# Patient Record
Sex: Female | Born: 1940 | Hispanic: Yes | Marital: Married | Smoking: Never smoker
Health system: Southern US, Community
[De-identification: ages and names within clinical notes are randomized; demographics above are authoritative.]

## PROBLEM LIST (undated history)

## (undated) DIAGNOSIS — C3490 Malignant neoplasm of unspecified part of unspecified bronchus or lung: Secondary | ICD-10-CM

## (undated) DIAGNOSIS — R7981 Abnormal blood-gas level: Secondary | ICD-10-CM

## (undated) DIAGNOSIS — F0280 Dementia in other diseases classified elsewhere without behavioral disturbance: Secondary | ICD-10-CM

## (undated) DIAGNOSIS — J189 Pneumonia, unspecified organism: Secondary | ICD-10-CM

## (undated) DIAGNOSIS — F0390 Unspecified dementia without behavioral disturbance: Secondary | ICD-10-CM

## (undated) DIAGNOSIS — Z9981 Dependence on supplemental oxygen: Secondary | ICD-10-CM

## (undated) HISTORY — DX: Dementia in other diseases classified elsewhere, unspecified severity, without behavioral disturbance, psychotic disturbance, mood disturbance, and anxiety: F02.80

## (undated) HISTORY — PX: PARTIAL HYSTERECTOMY: SHX80

## (undated) HISTORY — DX: Malignant neoplasm of unspecified part of unspecified bronchus or lung: C34.90

## (undated) HISTORY — DX: Pneumonia, unspecified organism: J18.9

## (undated) HISTORY — DX: Abnormal blood-gas level: R79.81

## (undated) HISTORY — DX: Dependence on supplemental oxygen: Z99.81

## (undated) HISTORY — DX: Unspecified dementia, unspecified severity, without behavioral disturbance, psychotic disturbance, mood disturbance, and anxiety: F03.90

---

## 2004-10-24 DIAGNOSIS — I4891 Unspecified atrial fibrillation: Secondary | ICD-10-CM

## 2004-10-24 HISTORY — DX: Unspecified atrial fibrillation: I48.91

## 2019-03-28 ENCOUNTER — Encounter: Payer: Self-pay | Admitting: Nurse Practitioner

## 2019-03-28 NOTE — Telephone Encounter (Signed)
Opened in error

## 2019-04-01 ENCOUNTER — Encounter (INDEPENDENT_AMBULATORY_CARE_PROVIDER_SITE_OTHER): Payer: Self-pay

## 2019-04-01 ENCOUNTER — Encounter: Payer: Self-pay | Admitting: Nurse Practitioner

## 2019-04-01 ENCOUNTER — Other Ambulatory Visit: Payer: Self-pay

## 2019-04-01 ENCOUNTER — Ambulatory Visit (INDEPENDENT_AMBULATORY_CARE_PROVIDER_SITE_OTHER): Payer: Medicare PPO | Admitting: Nurse Practitioner

## 2019-04-01 VITALS — BP 124/70 | HR 46 | Temp 98.1°F | Ht 63.0 in | Wt 164.0 lb

## 2019-04-01 DIAGNOSIS — Z85118 Personal history of other malignant neoplasm of bronchus and lung: Secondary | ICD-10-CM

## 2019-04-01 DIAGNOSIS — R0902 Hypoxemia: Secondary | ICD-10-CM | POA: Diagnosis not present

## 2019-04-01 DIAGNOSIS — R001 Bradycardia, unspecified: Secondary | ICD-10-CM

## 2019-04-01 DIAGNOSIS — I4891 Unspecified atrial fibrillation: Secondary | ICD-10-CM | POA: Diagnosis not present

## 2019-04-01 DIAGNOSIS — H6121 Impacted cerumen, right ear: Secondary | ICD-10-CM

## 2019-04-01 DIAGNOSIS — G309 Alzheimer's disease, unspecified: Secondary | ICD-10-CM

## 2019-04-01 DIAGNOSIS — H9312 Tinnitus, left ear: Secondary | ICD-10-CM | POA: Diagnosis not present

## 2019-04-01 DIAGNOSIS — F0281 Dementia in other diseases classified elsewhere with behavioral disturbance: Secondary | ICD-10-CM

## 2019-04-01 DIAGNOSIS — I509 Heart failure, unspecified: Secondary | ICD-10-CM

## 2019-04-01 NOTE — Patient Instructions (Signed)
Follow up in 3 months with labs prior to visit

## 2019-04-01 NOTE — Progress Notes (Signed)
Careteam: Patient Care Team: System, Pcp Not In as PCP - General Papagikos, Venia Carbon, MD as Referring Physician (Radiation Oncology)  Advanced Directive information Does Patient Have a Medical Advance Directive?: Yes, Type of Advance Directive: Out of facility DNR (pink MOST or yellow form), Does patient want to make changes to medical advance directive?: No - Patient declined  No Known Allergies  Chief Complaint  Patient presents with  . Establish Care    New patient establish care. Patient would like referral to local doctors within [place] area. Here with daughter Annakate   . Lung Specialist    Dr.Brian Legere in [facility]  . Advanced Directive    Discuss HCPOA, Living Will and Most Form      HPI: Patient is a 78 y.o. female seen in the office today to establish care.  She is new to town. She lives with her daughter here in [place].   Hyperlipidemia- recently started on lipitor 20 mg by mouth daily after recent hospitalization. (hospitalized for pneumonia in Nov 2019)   A fib/bradycardia- on ASA 81 mg, reports she was previously on a blood thinner coumadin but did not like to go in and get INR, then was changed to xarelto but could not afford that. Declined coumadin.  Reports ongoing bradycardia since hospitalization in 2019.   Lower LE edema- taking spironolactone and lasix, daughter reports she also had fluid in her heart and lungs.   CHF- on spironolactone and lasix. No increase in edema or fluid. Continues on O2, 2-3L, feels like she does not need it but has recent test to see if she qualified and she did.   Hx of lung cancer- currently in remission per daughter.   Dementia- has agitation associated with memory loss.  Not been on medication for this. Daughter states that she would not wish to take medication. Having hard time taking what she is taking now.   For supplement she takes Vit B12 for energy, D3 because she is not outside and Vit C  for immunity.   Reports ringing in ear, was only happening at night and now all the time. No dizziness or lightheadness.   Review of Systems:  Review of Systems  Constitutional: Negative for chills, fever and weight loss.  HENT: Positive for tinnitus.   Respiratory: Negative for cough, sputum production and shortness of breath.   Cardiovascular: Negative for chest pain, palpitations and leg swelling.  Gastrointestinal: Negative for abdominal pain, constipation, diarrhea and heartburn.  Genitourinary: Negative for dysuria, frequency and urgency.  Musculoskeletal: Negative for back pain, falls, joint pain and myalgias.  Skin: Negative.   Neurological: Negative for dizziness and headaches.  Psychiatric/Behavioral: Positive for memory loss. Negative for depression. The patient does not have insomnia.     Past Medical History:  Diagnosis Date  . Alzheimer disease [facility])    Per new patient packet- [facility]   . Dementia [facility])    Per new patient packet- [facility]   . Low oxygen saturation    Per new patient packet- [facility]   . Lung cancer [facility])    Per new patient packet- [facility]   . Pneumonia    Per new patient packet- [facility]    Past Surgical History:  Procedure Laterality Date  . PARTIAL HYSTERECTOMY     Still with ovaries, Per new patient packet- [facility]    Social History:   reports that she has never smoked. She has never used smokeless tobacco. She reports that she does  not drink alcohol or use drugs.  Family History  Problem Relation Age of Onset  . Dementia Mother   . Alzheimer's disease Father     Medications: Patient's Medications  New Prescriptions   No medications on file  Previous Medications   ASPIRIN EC 81 MG TABLET    Take 81 mg by mouth daily.   ATORVASTATIN (LIPITOR) 20 MG TABLET    Take 20 mg by mouth daily.   CHOLECALCIFEROL (VITAMIN D3 SUPER STRENGTH) 50 MCG (2000 UT) TABS    Take by mouth daily at 8 pm.   FUROSEMIDE (LASIX) 20 MG TABLET    Take 20 mg by mouth daily.   OXYGEN     Inhale 3 L into the lungs continuous.   SPIRONOLACTONE (ALDACTONE) 25 MG TABLET    Take 25 mg by mouth daily.   VITAMIN B-12 (CYANOCOBALAMIN) 1000 MCG TABLET    Take 1,000 mcg by mouth daily.   VITAMIN C (ASCORBIC ACID) 500 MG TABLET    Take 500 mg by mouth daily.   VITAMINS-LIPOTROPICS (LIPO-FLAVONOID PLUS PO)    Take 1,000 mg by mouth daily.  Modified Medications   No medications on file  Discontinued Medications   No medications on file    Physical Exam:  Vitals:   04/01/19 1338  BP: 124/70  Pulse: (!) 46  Temp: 98.1 F (36.7 C)  TempSrc: Oral  SpO2: 99%  Weight: 164 lb (74.4 kg)  Height: 5\' 3"  (1.6 m)   Body mass index is 29.05 kg/m. Wt Readings from Last 3 Encounters:  04/01/19 164 lb (74.4 kg)    Physical Exam Constitutional:      General: She is not in acute distress.    Appearance: She is well-developed. She is not diaphoretic.  HENT:     Head: Normocephalic and atraumatic.     Right Ear: There is impacted cerumen.     Left Ear: Tympanic membrane normal.     Nose: Nose normal.     Mouth/Throat:     Pharynx: No oropharyngeal exudate.  Eyes:     Conjunctiva/sclera: Conjunctivae normal.     Pupils: Pupils are equal, round, and reactive to light.  Neck:     Musculoskeletal: Normal range of motion and neck supple.  Cardiovascular:     Rate and Rhythm: Regular rhythm. Bradycardia present.     Heart sounds: Normal heart sounds.  Pulmonary:     Effort: Pulmonary effort is normal.     Breath sounds: Normal breath sounds.  Abdominal:     General: Bowel sounds are normal.     Palpations: Abdomen is soft.  Musculoskeletal:        General: No tenderness.  Skin:    General: Skin is warm and dry.     Findings: No bruising.  Neurological:     Mental Status: She is alert and oriented to person, place, and time.     Labs reviewed: Basic Metabolic Panel: No results for input(s): NA, K, CL, CO2, GLUCOSE, BUN, CREATININE, CALCIUM, MG, PHOS, TSH in the last 8760  hours. Liver Function Tests: No results for input(s): AST, ALT, ALKPHOS, BILITOT, PROT, ALBUMIN in the last 8760 hours. No results for input(s): LIPASE, AMYLASE in the last 8760 hours. No results for input(s): AMMONIA in the last 8760 hours. CBC: No results for input(s): WBC, NEUTROABS, HGB, HCT, MCV, PLT in the last 8760 hours. Lipid Panel: No results for input(s): CHOL, HDL, LDLCALC, TRIG, CHOLHDL, LDLDIRECT in the last 8760 hours. TSH:  No results for input(s): TSH in the last 8760 hours. A1C: No results found for: HGBA1C   Assessment/Plan 1. Hx of cancer of lung S/p resection, we do not have records at this time.  - Ambulatory referral to Pulmonology - Ambulatory referral to Hematology / Oncology  2. Atrial fibrillation, unspecified type (HCC) Bradycardia noted today. She did not tolerate coumadin due to excessive bruising and did not wish to check INR levels, NOAC were too expensive so it was decided she would maintain on ASA 81 mg daily.  - Ambulatory referral to Cardiology - CBC with Differential/Platelet - COMPLETE METABOLIC PANEL WITH GFR - TSH  3. Hypoxia Using long term O2 at 2L O2 maintained while in office for trail off oxygen but daughter reports it generally will drop.  - Ambulatory referral to Pulmonology  4. Bradycardia -ongoing since hospitalization in December of 2019, not on any rate related medication. Appears to be asymptomatic  - Ambulatory referral to Cardiology - COMPLETE METABOLIC PANEL WITH GFR - TSH  5. Alzheimer's dementia with behavioral disturbance, unspecified timing of dementia onset ([place]) -living with daughter, does not wish to be on medication at this time as she does not like to take pills. Daughter states that she would have to explain what the pill was for daily causing more agitation.   6. Congestive heart failure, unspecified HF chronicity, unspecified heart failure type ([place]) -continues on lasix and aldactone, no signs of fluid  overload at this time. - Ambulatory referral to Cardiology for further evaluation.   7. Tinnitus of left ear Ongoing, pt reports it "drives her crazy" denies hearing loss, dizziness but would like evaluated futher - Ambulatory referral to ENT for further evaluation    8. Impacted cerumen of right ear - Ear Lavage- pt tolerated well, used grabbers to remove large amount of wax  Awaiting pt records from previous PCP and specialist  Next appt: 06/28/2019  Janett Billow K. Nectar, [facility] [phone]

## 2019-04-02 LAB — CBC WITH DIFFERENTIAL/PLATELET
Absolute Monocytes: 439 cells/uL (ref 200–950)
Basophils Absolute: 31 cells/uL (ref 0–200)
Basophils Relative: 0.6 %
Eosinophils Absolute: 92 cells/uL (ref 15–500)
Eosinophils Relative: 1.8 %
HCT: 33.3 % — ABNORMAL LOW (ref 35.0–45.0)
Hemoglobin: 10.6 g/dL — ABNORMAL LOW (ref 11.7–15.5)
Lymphs Abs: 1433 cells/uL (ref 850–3900)
MCH: 29.1 pg (ref 27.0–33.0)
MCHC: 31.8 g/dL — ABNORMAL LOW (ref 32.0–36.0)
MCV: 91.5 fL (ref 80.0–100.0)
MPV: 13.3 fL — ABNORMAL HIGH (ref 7.5–12.5)
Monocytes Relative: 8.6 %
Neutro Abs: 3106 cells/uL (ref 1500–7800)
Neutrophils Relative %: 60.9 %
Platelets: 138 10*3/uL — ABNORMAL LOW (ref 140–400)
RBC: 3.64 10*6/uL — ABNORMAL LOW (ref 3.80–5.10)
RDW: 11.7 % (ref 11.0–15.0)
Total Lymphocyte: 28.1 %
WBC: 5.1 10*3/uL (ref 3.8–10.8)

## 2019-04-02 LAB — COMPLETE METABOLIC PANEL WITH GFR
AG Ratio: 1.2 (calc) (ref 1.0–2.5)
ALT: 8 U/L (ref 6–29)
AST: 15 U/L (ref 10–35)
Albumin: 4 g/dL (ref 3.6–5.1)
Alkaline phosphatase (APISO): 50 U/L (ref 37–153)
BUN: 18 mg/dL (ref 7–25)
CO2: 36 mmol/L — ABNORMAL HIGH (ref 20–32)
Calcium: 9.5 mg/dL (ref 8.6–10.4)
Chloride: 101 mmol/L (ref 98–110)
Creat: 0.65 mg/dL (ref 0.60–0.93)
GFR, Est African American: 99 mL/min/{1.73_m2} (ref >=60)
GFR, Est Non African American: 86 mL/min/{1.73_m2} (ref >=60)
Globulin: 3.4 g/dL (calc) (ref 1.9–3.7)
Glucose, Bld: 87 mg/dL (ref 65–139)
Potassium: 4.8 mmol/L (ref 3.5–5.3)
Sodium: 140 mmol/L (ref 135–146)
Total Bilirubin: 0.6 mg/dL (ref 0.2–1.2)
Total Protein: 7.4 g/dL (ref 6.1–8.1)

## 2019-04-02 LAB — TSH: TSH: 2.38 mIU/L (ref 0.40–4.50)

## 2019-04-11 ENCOUNTER — Telehealth: Payer: Self-pay | Admitting: Nurse Practitioner

## 2019-04-11 NOTE — Telephone Encounter (Signed)
Do they need a referral.

## 2019-04-11 NOTE — Telephone Encounter (Signed)
Just an Juluis Rainier; patient's daughter Innocence stated that they have a pulmonologist for patient to see. Dr. Dola Factor (daughter was unsure of spelling of name) in [place], [place].  Patient has an appointment on 04/18/19.

## 2019-05-15 ENCOUNTER — Telehealth: Payer: Self-pay | Admitting: Internal Medicine

## 2019-05-15 NOTE — Telephone Encounter (Signed)
LVM for pt to call back and be pre-screened for her appt on 05-16-19 with Dr Margaretann Loveless.

## 2019-05-16 ENCOUNTER — Encounter: Payer: Self-pay | Admitting: Internal Medicine

## 2019-05-16 ENCOUNTER — Telehealth: Payer: Self-pay | Admitting: Internal Medicine

## 2019-05-16 ENCOUNTER — Ambulatory Visit: Payer: Medicare PPO | Admitting: Internal Medicine

## 2019-05-16 ENCOUNTER — Other Ambulatory Visit: Payer: Self-pay

## 2019-05-16 VITALS — BP 138/52 | HR 51 | Temp 97.7°F | Ht 65.0 in | Wt 166.0 lb

## 2019-05-16 DIAGNOSIS — I509 Heart failure, unspecified: Secondary | ICD-10-CM

## 2019-05-16 DIAGNOSIS — I4891 Unspecified atrial fibrillation: Secondary | ICD-10-CM

## 2019-05-16 DIAGNOSIS — R079 Chest pain, unspecified: Secondary | ICD-10-CM | POA: Diagnosis not present

## 2019-05-16 MED ORDER — RIVAROXABAN 20 MG PO TABS: 20.0000 mg | ORAL_TABLET | Freq: Every day | ORAL | 1 refills | Status: DC

## 2019-05-16 NOTE — Patient Instructions (Addendum)
Medication Instructions:  Start Xarelto 20 mg daily   If you need a refill on your cardiac medications before your next appointment, please call your pharmacy.   Testing/Procedures: Echocardiogram - Your physician has requested that you have an echocardiogram. Echocardiography is a painless test that uses sound waves to create images of your heart. It provides your doctor with information about the size and shape of your heart and how well your heart's chambers and valves are working. This procedure takes approximately one hour. There are no restrictions for this procedure. This will be performed at our [facility] location - [address], [address].  Your physician has requested that you have a lexiscan myoview. A cardiac stress test is a cardiological test that measures the heart's ability to respond to external stress in a controlled clinical environment. The stress response is induced by intravenous pharmacological stimulation. This will be performed at our [facility] location- [address], [place]: At [employer], you and your health needs are our priority.  As part of our continuing mission to provide you with exceptional heart care, we have created designated Provider Care Teams.  These Care Teams include your primary Cardiologist (physician) and Advanced Practice Providers (APPs -  Physician Assistants and Nurse Practitioners) who all work together to provide you with the care you need, when you need it. . You will need a follow up appointment in 1 months with Doreene Adas, PA.    How to Use Compression Stockings Compression stockings are elastic socks that squeeze the legs. They help increase blood flow (circulation) to the legs, decrease swelling in the legs, and reduce the chance of developing blood clots in the lower legs. Compression stockings are often used by people who:  Are recovering from surgery.  Have poor circulation in their legs.  Tend to get blood  clots in their legs.  Have bulging (varicose) veins.  Sit or stay in bed for long periods of time. Follow instructions from your health care provider about how and when to wear your compression stockings. How to wear compression stockings Before you put on your compression stockings:  Make sure that they are the correct size and degree of compression. If you do not know your size or required grade of compression, ask your health care provider and follow the manufacturer's instructions that come with the stockings.  Make sure that they are clean, dry, and in good condition.  Check them for rips and tears. Do not put them on if they are ripped or torn. Put your stockings on first thing in the morning, before you get out of bed. Keep them on for as long as your health care provider advises. When you are wearing your stockings:  Keep them as smooth as possible. Do not allow them to bunch up. It is especially important to prevent the stockings from bunching up around your toes or behind your knees.  Do not roll the stockings downward and leave them rolled down. This can decrease blood flow to your leg.  Change them right away if they become wet or dirty. When you take off your stockings, inspect your legs and feet. Check for:  Open sores.  Red spots.  Swelling. General tips  Do not stop wearing compression stockings without talking to your health care provider first.  Wash your stockings every day with mild detergent in cold or warm water. Do not use bleach. Air-dry your stockings or dry them in  a clothes dryer on low heat. It may be helpful to have two pairs so that you have a pair to wear while the other is being washed.  Replace your stockings every 3-6 months.  If skin moisturizing is part of your treatment plan, apply lotion or cream at night so that your skin will be dry when you put on the stockings in the morning. It is harder to put the stockings on when you have lotion on  your legs or feet.  Wear nonskid shoes or slip-resistant socks when walking while wearing compression stockings. Contact a health care provider and remove your stockings if you have:  A feeling of pins and needles in your feet or legs.  Open sores, red spots, or other skin changes on your feet or legs.  Swelling or pain that gets worse. Get help right away if you have:  Numbness or tingling in your lower legs that does not get better right after you take the stockings off.  Toes or feet that are unusually cold or turn a bluish color.  A warm or red area on your leg.  New swelling or soreness in your leg.  Shortness of breath.  Chest pain.  A fast or irregular heartbeat.  Light-headedness.  Dizziness. Summary  Compression stockings are elastic socks that squeeze the legs.  They help increase blood flow (circulation) to the legs, decrease swelling in the legs, and reduce the chance of developing blood clots in the lower legs.  Follow instructions from your health care provider about how and when to wear your compression stockings.  Do not stop wearing your compression stockings without talking to your health care provider first. This information is not intended to replace advice given to you by your health care provider. Make sure you discuss any questions you have with your health care provider. Document Released: 08/07/2009 Document Revised: 10/12/2017 Document Reviewed: 10/12/2017 Elsevier Patient Education  2020 [employer].

## 2019-05-16 NOTE — Telephone Encounter (Signed)
New Message:   Pt 's  daughter will need tocome in with the pt. She is her Interpreter and pt also have memory problems.he p

## 2019-05-16 NOTE — Telephone Encounter (Signed)

## 2019-05-16 NOTE — Telephone Encounter (Signed)
Pt daughter has been made aware that she can attend appointment.

## 2019-05-16 NOTE — Progress Notes (Signed)
Cardiology Office Note:    Date:  05/16/2019   ID:  Valerie [name], DOB 09/11/1942, MRN [id number]  PCP:  Lauree Chandler, NP  Cardiologist:  No primary care provider on file.  Electrophysiologist:  None   Referring MD: Lauree Chandler, NP   Chief Complaint: establish cardiovascular care  History of Present Illness:    Valerie [name] is a 78 y.o. female with a hx of hyperlipidemia, atrial fibrillation with slow ventricular response, not currently on anticoagulation, lower extremity edema and history of multiple heart failure exacerbations, history of lung cancer in remission, and dementia for which she is joined by her daughter today who is a CMA and serves as the patient's interpreter today.  She presents to establish cardiovascular care at the request of Sherrie Mustache nurse practitioner.  The patient has recently moved from [place], [place] where her cardiologist was Dr. Emelia Loron.  Her primary cardiologist has retired, we will attempt to get records from his office if able.  She had a hospitalization last in November 2019.  She was feeling poorly several days before Thanksgiving and the day after Thanksgiving she stated that she needed to go to the hospital.  She was found to have hypoxemia and was hospitalized for 10 days.  The patient feels she only remembers being there overnight, and her daughter tells me she was very gravely ill.  Of note she did not require intubation or ICU stay.  She was found to have pneumonia, pulmonary edema, and "enlarged heart".  She has been continued on Lasix and spironolactone and has done much better from a heart failure standpoint since that time.  She does continue to have what sounds like NYHA class II symptoms with some orthopnea and dyspnea on exertion.  She does not endorse significant leg swelling and overall is able to complete her activities of daily living.  She has been prescribed oxygen and initially was using this continuously after her  hospitalization.  Now it is deemed appropriate to be used PRN.  We do not know the etiology or classification of her diagnosis of heart failure.  Once a month she endorses an episode of chest discomfort and points to the left side of her chest.  It is not exacerbated by deep breathing.  It is a fleeting chest discomfort for approximately 5 seconds.  It can occur at rest.  She has had a stress test several years ago that was reportedly normal per her daughter.  She is noted to have atrial fibrillation with slow ventricular response, it is unclear at this time if this is paroxysmal or persistent, though likely the latter.  She was previously on warfarin but had difficulty with frequent INR checks so she was transition to Xarelto.  Unfortunately cost was a prohibitive factor in this medication and it was discontinued.  She was continued on a baby aspirin.  She has since been informed that this is unlikely to be adequate anticoagulation and her chadsvasc is 4.  We have discussed stroke risk and percentages today. This patients CHA2DS2-VASc Score and unadjusted Ischemic Stroke Rate (% per year) is equal to 4.8 % stroke rate/year from a score of 4 Above score calculated as 1 point each if present [CHF, HTN, DM, Vascular=MI/PAD/Aortic Plaque, Age if 65-74, or Female] Above score calculated as 2 points each if present [Age > 75, or Stroke/TIA/TE].  Past Medical History:  Diagnosis Date   Alzheimer disease [facility])    Per new patient packet- [place]  Dementia ([place])    Per new patient packet- [facility]    Low oxygen saturation    Per new patient packet- [place]    Lung cancer ([place])    Per new patient packet- [facility]    Pneumonia    Per new patient packet- [place]     Past Surgical History:  Procedure Laterality Date   PARTIAL HYSTERECTOMY     Still with ovaries, Per new patient packet- [facility]     Current Medications: Current Meds  Medication Sig   aspirin EC 81 MG tablet Take 81 mg by mouth daily.    atorvastatin (LIPITOR) 20 MG tablet Take 20 mg by mouth daily.   Cholecalciferol (VITAMIN D3 SUPER STRENGTH) 50 MCG (2000 UT) TABS Take by mouth daily at 8 pm.   furosemide (LASIX) 20 MG tablet Take 20 mg by mouth daily.   OXYGEN Inhale 3 L into the lungs continuous.   spironolactone (ALDACTONE) 25 MG tablet Take 25 mg by mouth daily.   vitamin B-12 (CYANOCOBALAMIN) 1000 MCG tablet Take 1,000 mcg by mouth daily.   vitamin C (ASCORBIC ACID) 500 MG tablet Take 500 mg by mouth daily.   Vitamins-Lipotropics (LIPO-FLAVONOID PLUS PO) Take 1,000 mg by mouth daily.     Allergies:   Patient has no known allergies.   Social History   Socioeconomic History   Marital status: Married    Spouse name: Not on file   Number of children: Not on file   Years of education: Not on file   Highest education level: Not on file  Occupational History   Not on file  Social Needs   Financial resource strain: Not on file   Food insecurity    Worry: Not on file    Inability: Not on file   Transportation needs    Medical: Not on file    Non-medical: Not on file  Tobacco Use   Smoking status: Never Smoker   Smokeless tobacco: Never Used  Substance and Sexual Activity   Alcohol use: Never    Frequency: Never   Drug use: Never   Sexual activity: Not on file  Lifestyle   Physical activity    Days per week: Not on file    Minutes per session: Not on file   Stress: Not on file  Relationships   Social connections    Talks on phone: Not on file    Gets together: Not on file    Attends religious service: Not on file    Active member of club or organization: Not on file    Attends meetings of clubs or organizations: Not on file    Relationship status: Not on file  Other Topics Concern   Not on file  Social History Narrative   Per [facility] new patient packet abstracted 03/28/2019      Diet: Vegetarian       Caffeine: None      Married, if yes what year: Yes, 1959      Do you live  in a house, apartment, assisted living, condo, trailer, ect: House, 2 stories, 5 persons      Pets: Dog      Highest level of education: 6th grade       Current/Past profession: N/A      Exercise:No         Living Will: No   DNR: No   POA/HPOA: No      Functional Status:   Do you have difficulty bathing or dressing yourself?  No   Do you have difficulty preparing food or eating? Yes   Do you have difficulty managing your medications? Yes   Do you have difficulty managing your finances? Yes   Do you have difficulty affording your medications? Nes     Family History: The patient's family history includes Alzheimer's disease in her father and mother.  ROS:   Please see the history of present illness.    All other systems reviewed and are negative.  EKGs/Labs/Other Studies Reviewed:    The following studies were reviewed today:  EKG:  afib rate 51, LAFB  Recent Labs: 04/01/2019: ALT 8; BUN 18; Creat 0.65; Hemoglobin 10.6; Platelets 138; Potassium 4.8; Sodium 140; TSH 2.38  Recent Lipid Panel No results found for: CHOL, TRIG, HDL, CHOLHDL, VLDL, LDLCALC, LDLDIRECT  Physical Exam:    VS:  BP (!) 138/52 (BP Location: Left Arm, Patient Position: Sitting, Cuff Size: Large)    Pulse (!) 51    Temp 97.7 F (36.5 C)    Ht 5\' 5"  (1.651 m)    Wt 166 lb (75.3 kg)    BMI 27.62 kg/m     Wt Readings from Last 5 Encounters:  05/16/19 166 lb (75.3 kg)  04/01/19 164 lb (74.4 kg)     Constitutional: No acute distress Eyes: sclera non-icteric, normal conjunctiva and lids ENMT: normal dentition, moist mucous membranes Cardiovascular: Irregular rhythm, bradycardic, 2 out of 6 systolic murmur, ejection quality, spares S2. S1 normal.  No jugular venous distention.  Respiratory: clear to auscultation bilaterally GI : normal bowel sounds, soft and nontender. No distention.   MSK: extremities warm, well perfused. No edema.  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 NEURO:  grossly nonfocal exam, moves all extremities. PSYCH: alert and oriented x 3, normal mood and affect.   ASSESSMENT:    1. Chest pain, unspecified type   2. Heart failure, type unknown ([place])   3. Atrial fibrillation, unspecified type ([place])    PLAN:    For chest pain it is important that we exclude ischemia especially in the setting of multiple heart failure admissions and no evidence to suggest an ischemic work-up has recently been completed.  We will obtain a Lexiscan stress test to evaluate for ischemia and transient ischemic dilatation.  She is on atorvastatin 20 mg daily and aspirin 81 mg daily.  She has a 2 out of 6 systolic ejection quality murmur on exam, and has heart failure with unknown ejection fraction at this time.  It would be important to characterize these issues we will obtain an echocardiogram  Atrial fibrillation- her rate is approximately 51 and she does not appear to have issues with presyncope or syncope related to bradycardia.  No rate control needed at this time.  I would like for her to start back on DOAC - xarelto 20 mg daily given a chads vasc score of 4.  We have participated in shared decision making with the patient's daughter, and they have decided that they would like to mitigate her stroke risk and would like to pursue anticoagulation.  I think this is very reasonable.  She has had no major bleeding events.  Lower extremity edema/venous varicosities- she is currently without significant leg swelling, however I encouraged her to use compression stockings given visible venous varicosities on exam.  She has a few pairs and will try them when she goes home.  Heart failure of unspecified type- she appears to be NYHA class II symptoms at this time, continue on  furosemide which is being given once daily 20 mg as well as spironolactone 25 mg daily.  Will make medication adjustments based on findings of echocardiogram since I believe we can potentially optimize therapy  further.  F/u 1 mo with APP to follow up test results.   TIME SPENT WITH PATIENT: 60 minutes of direct patient care. More than 50% of that time was spent on coordination of care and counseling regarding heart failure, atrial fibrillation, medication management, and counseling on anticoagulation.  Cherlynn Kaiser, MD [place]   [facility]   Medication Adjustments/Labs and Tests Ordered: Current medicines are reviewed at length with the patient today.  Concerns regarding medicines are outlined above.  Orders Placed This Encounter  Procedures   MYOCARDIAL PERFUSION IMAGING   EKG 12-Lead   ECHOCARDIOGRAM COMPLETE   Meds ordered this encounter  Medications   rivaroxaban (XARELTO) 20 MG TABS tablet    Sig: Take 1 tablet (20 mg total) by mouth daily with supper.    Dispense:  90 tablet    Refill:  1    Patient Instructions  Medication Instructions:  Start Xarelto 20 mg daily   If you need a refill on your cardiac medications before your next appointment, please call your pharmacy.   Testing/Procedures: Echocardiogram - Your physician has requested that you have an echocardiogram. Echocardiography is a painless test that uses sound waves to create images of your heart. It provides your doctor with information about the size and shape of your heart and how well your hearts chambers and valves are working. This procedure takes approximately one hour. There are no restrictions for this procedure. This will be performed at our [facility] location - [address].  Your physician has requested that you have a lexiscan myoview. A cardiac stress test is a cardiological test that measures the heart's ability to respond to external stress in a controlled clinical environment. The stress response is induced by intravenous pharmacological stimulation. This will be performed at our [facility] location- [address], [place]: At [employer], you and your  health needs are our priority.  As part of our continuing mission to provide you with exceptional heart care, we have created designated Provider Care Teams.  These Care Teams include your primary Cardiologist (physician) and Advanced Practice Providers (APPs -  Physician Assistants and Nurse Practitioners) who all work together to provide you with the care you need, when you need it.  You will need a follow up appointment in 1 months with Doreene Adas, PA.    How to Use Compression Stockings Compression stockings are elastic socks that squeeze the legs. They help increase blood flow (circulation) to the legs, decrease swelling in the legs, and reduce the chance of developing blood clots in the lower legs. Compression stockings are often used by people who:  Are recovering from surgery.  Have poor circulation in their legs.  Tend to get blood clots in their legs.  Have bulging (varicose) veins.  Sit or stay in bed for long periods of time. Follow instructions from your health care provider about how and when to wear your compression stockings. How to wear compression stockings Before you put on your compression stockings:  Make sure that they are the correct size and degree of compression. If you do not know your size or required grade of compression, ask your health care provider and follow the manufacturer's instructions that come with the stockings.  Make sure that they are clean, dry, and in good condition.  Check them for rips and tears. Do not put them on if they are ripped or torn. Put your stockings on first thing in the morning, before you get out of bed. Keep them on for as long as your health care provider advises. When you are wearing your stockings:  Keep them as smooth as possible. Do not allow them to bunch up. It is especially important to prevent the stockings from bunching up around your toes or behind your knees.  Do not roll the stockings downward and leave them  rolled down. This can decrease blood flow to your leg.  Change them right away if they become wet or dirty. When you take off your stockings, inspect your legs and feet. Check for:  Open sores.  Red spots.  Swelling. General tips  Do not stop wearing compression stockings without talking to your health care provider first.  Wash your stockings every day with mild detergent in cold or warm water. Do not use bleach. Air-dry your stockings or dry them in a clothes dryer on low heat. It may be helpful to have two pairs so that you have a pair to wear while the other is being washed.  Replace your stockings every 3-6 months.  If skin moisturizing is part of your treatment plan, apply lotion or cream at night so that your skin will be dry when you put on the stockings in the morning. It is harder to put the stockings on when you have lotion on your legs or feet.  Wear nonskid shoes or slip-resistant socks when walking while wearing compression stockings. Contact a health care provider and remove your stockings if you have:  A feeling of pins and needles in your feet or legs.  Open sores, red spots, or other skin changes on your feet or legs.  Swelling or pain that gets worse. Get help right away if you have:  Numbness or tingling in your lower legs that does not get better right after you take the stockings off.  Toes or feet that are unusually cold or turn a bluish color.  A warm or red area on your leg.  New swelling or soreness in your leg.  Shortness of breath.  Chest pain.  A fast or irregular heartbeat.  Light-headedness.  Dizziness. Summary  Compression stockings are elastic socks that squeeze the legs.  They help increase blood flow (circulation) to the legs, decrease swelling in the legs, and reduce the chance of developing blood clots in the lower legs.  Follow instructions from your health care provider about how and when to wear your compression  stockings.  Do not stop wearing your compression stockings without talking to your health care provider first. This information is not intended to replace advice given to you by your health care provider. Make sure you discuss any questions you have with your health care provider. Document Released: 08/07/2009 Document Revised: 10/12/2017 Document Reviewed: 10/12/2017 Elsevier Patient Education  2020 [employer].

## 2019-05-22 ENCOUNTER — Other Ambulatory Visit: Payer: Self-pay

## 2019-05-22 ENCOUNTER — Ambulatory Visit (HOSPITAL_COMMUNITY): Payer: Medicare PPO | Attending: Internal Medicine

## 2019-05-22 DIAGNOSIS — R079 Chest pain, unspecified: Secondary | ICD-10-CM | POA: Insufficient documentation

## 2019-05-22 DIAGNOSIS — I509 Heart failure, unspecified: Secondary | ICD-10-CM | POA: Diagnosis not present

## 2019-05-28 ENCOUNTER — Telehealth (HOSPITAL_COMMUNITY): Payer: Self-pay | Admitting: *Deleted

## 2019-05-28 NOTE — Telephone Encounter (Signed)
Close encounter 

## 2019-05-30 ENCOUNTER — Other Ambulatory Visit: Payer: Self-pay

## 2019-05-30 ENCOUNTER — Ambulatory Visit (HOSPITAL_COMMUNITY)
Admission: RE | Admit: 2019-05-30 | Discharge: 2019-05-30 | Disposition: A | Discharge status: Home / Self Care | Payer: Medicare PPO | Source: Ambulatory Visit | Attending: Cardiology | Admitting: Cardiology

## 2019-05-30 DIAGNOSIS — I509 Heart failure, unspecified: Secondary | ICD-10-CM | POA: Diagnosis not present

## 2019-05-30 DIAGNOSIS — R079 Chest pain, unspecified: Secondary | ICD-10-CM

## 2019-05-30 LAB — MYOCARDIAL PERFUSION IMAGING
Peak HR: 65 {beats}/min
Rest HR: 43 {beats}/min
SDS: 0
SRS: 13
SSS: 13
TID: 1.02

## 2019-05-30 MED ORDER — TECHNETIUM TC 99M TETROFOSMIN IV KIT
10.5000 | PACK | Freq: Once | INTRAVENOUS | Status: AC | PRN
  Administered 2019-05-30: 10.5 via INTRAVENOUS
  Filled 2019-05-30: qty 11

## 2019-05-30 MED ORDER — REGADENOSON 0.4 MG/5ML IV SOLN
0.4000 mg | Freq: Once | INTRAVENOUS | Status: AC
  Administered 2019-05-30: 0.4 mg via INTRAVENOUS

## 2019-05-30 MED ORDER — TECHNETIUM TC 99M TETROFOSMIN IV KIT
30.3000 | PACK | Freq: Once | INTRAVENOUS | Status: AC | PRN
  Administered 2019-05-30: 30.3 via INTRAVENOUS
  Filled 2019-05-30: qty 31

## 2019-05-30 MED ORDER — AMINOPHYLLINE 25 MG/ML IV SOLN
125.0000 mg | Freq: Once | INTRAVENOUS | Status: AC
  Administered 2019-05-30: 125 mg via INTRAVENOUS

## 2019-06-10 NOTE — Progress Notes (Addendum)
Cardiology Office Note:    Date:  06/12/2019   ID:  Valerie [name], DOB 09/11/1942, MRN [id number]  PCP:  Lauree Chandler, NP  Cardiologist:  [name], MD   Referring MD: Lauree Chandler, NP   Chief Complaint  Patient presents with  . Chest Pain    History of Present Illness:    Valerie [name] is a 78 y.o. female with a hx of atrial fibrillation with slow ventricular response not on AC, HLD, CHF with multiple exacerbation with ongoing NYHA class II symptoms, recent echo with EF of 50-55%, hx of lung cancer in remission (s/p radiation), and dementia. She recently moved to [facility] and established car with Dr. Margaretann Loveless. She was hospitalized Nov 2019 with PNA, pulmonary edema, and cardiomyopathy. She did not require intubation or ICU care. She was initially on coumadin for Afib, but had difficulty with frequent INR checks, Transitioned to xarelto, but cost prohibitive. She was maintained on 81 mg ASA. AC was discussed at her clinic visit with Dr. Margaretann Loveless 05/16/19 for a CHA2DS2-VASc of 4 (age, CHF, female). She reported chest pain and subsequently underwent echocardiogram and myoview stress test. Echocardiogram with increased LV filling pressure, severe biatrial enlargement, mild mitral regurgitation, moderate AS and severe aortic valve regurgitation. EF was 50-55%.  Stress test showed evidence of inferior/inferolateral infarct with minimal peri-infarct ischemia. Given that she has had multiple HF admission and her nuc suggests prior MI, she may need a right and left heart cath.    She presents today to discuss her recent studies. She is here with her daughter who helps with interpretation.  She has been taking 20 mg lasix, 25 mg spironolactone, and xarelto. I had a lengthy discussion about her stress test and echocardiogram. She does continue to complain of left-sided chest pain and dyspnea on exertion. She sleeps inclined on a hospital bed for back pain and for bleeding. Her daughter states that  given her dementia, she is likely under-reporting her symptoms, or reports symptoms days later. I think she down-plays her symptoms.    Past Medical History:  Diagnosis Date  . Alzheimer disease [facility])    Per new patient packet- [facility]   . Dementia [facility])    Per new patient packet- [facility]   . Low oxygen saturation    Per new patient packet- [facility]   . Lung cancer [facility])    Per new patient packet- [facility]   . Pneumonia    Per new patient packet- [facility]     Past Surgical History:  Procedure Laterality Date  . PARTIAL HYSTERECTOMY     Still with ovaries, Per new patient packet- [facility]     Current Medications: Current Meds  Medication Sig  . aspirin EC 81 MG tablet Take 81 mg by mouth daily.  Marland Kitchen atorvastatin (LIPITOR) 20 MG tablet Take 20 mg by mouth daily.  . Cholecalciferol (VITAMIN D3 SUPER STRENGTH) 50 MCG (2000 UT) TABS Take by mouth daily at 8 pm.  . furosemide (LASIX) 20 MG tablet Take 20 mg by mouth daily.  . OXYGEN Inhale 3 L into the lungs continuous.  . rivaroxaban (XARELTO) 20 MG TABS tablet Take 1 tablet (20 mg total) by mouth daily with supper.  Marland Kitchen spironolactone (ALDACTONE) 25 MG tablet Take 25 mg by mouth daily.  . vitamin B-12 (CYANOCOBALAMIN) 1000 MCG tablet Take 1,000 mcg by mouth daily.  . vitamin C (ASCORBIC ACID) 500 MG tablet Take 500 mg by mouth daily.  . Vitamins-Lipotropics (LIPO-FLAVONOID PLUS PO) Take  1,000 mg by mouth daily.     Allergies:   Patient has no known allergies.   Social History   Socioeconomic History  . Marital status: Married    Spouse name: Not on file  . Number of children: Not on file  . Years of education: Not on file  . Highest education level: Not on file  Occupational History  . Not on file  Social Needs  . Financial resource strain: Not on file  . Food insecurity    Worry: Not on file    Inability: Not on file  . Transportation needs    Medical: Not on file    Non-medical: Not on file  Tobacco Use  . Smoking status: Never Smoker  .  Smokeless tobacco: Never Used  Substance and Sexual Activity  . Alcohol use: Never    Frequency: Never  . Drug use: Never  . Sexual activity: Not on file  Lifestyle  . Physical activity    Days per week: Not on file    Minutes per session: Not on file  . Stress: Not on file  Relationships  . Social Herbalist on phone: Not on file    Gets together: Not on file    Attends religious service: Not on file    Active member of club or organization: Not on file    Attends meetings of clubs or organizations: Not on file    Relationship status: Not on file  Other Topics Concern  . Not on file  Social History Narrative   Per [facility] new patient packet abstracted 03/28/2019      Diet: Vegetarian       Caffeine: None      Married, if yes what year: Yes, 1959      Do you live in a house, apartment, assisted living, condo, trailer, ect: House, 2 stories, 5 persons      Pets: Dog      Highest level of education: 6th grade       Current/Past profession: N/A      Exercise:No         Living Will: No   DNR: No   POA/HPOA: No      Functional Status:   Do you have difficulty bathing or dressing yourself? No   Do you have difficulty preparing food or eating? Yes   Do you have difficulty managing your medications? Yes   Do you have difficulty managing your finances? Yes   Do you have difficulty affording your medications? Nes     Family History: The patient's family history includes Alzheimer's disease in her father and mother.  ROS:   Please see the history of present illness.     All other systems reviewed and are negative.  EKGs/Labs/Other Studies Reviewed:    The following studies were reviewed today:  Echo 05/22/19: 1. The left ventricle has low normal systolic function, with an ejection fraction of 50-55%. The cavity size was normal. There is moderately increased left ventricular wall thickness. Left ventricular diastolic function could not be evaluated secondary   to atrial fibrillation. Elevated left atrial and left ventricular end-diastolic pressures The E/e' is >20.  2. The right ventricle has normal systolic function. The cavity was normal. There is mildly increased right ventricular wall thickness.  3. Left atrial size was severely dilated.  4. Right atrial size was severely dilated.  5. The mitral valve is abnormal. Mild thickening of the mitral valve leaflet. Mitral valve regurgitation  is moderate by color flow Doppler.  6. The tricuspid valve is grossly normal.  7. The aortic valve is abnormal. Severe calcifcation of the aortic valve. Aortic valve regurgitation is severe by color flow Doppler. Moderate-severe stenosis of the aortic valve.  8. The aorta is abnormal in size and structure.  9. There is mild dilatation of the ascending aorta measuring 39 mm. 10. The inferior vena cava was dilated in size with >50% respiratory variability. 11. Evidence of atrial level shunting detected by color flow Doppler. 12. Small patent foramen ovale with predominantly left to right shunting across the atrial septum.   Lexiscan myoview 05/30/19:  There was no ST segment deviation noted during stress.  No T wave inversion was noted during stress.  Defect 1: There is a medium defect of moderate severity present in the basal inferior, basal inferolateral, mid inferior and mid inferolateral location.  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  This is a low risk study.   Low risk stress nuclear study with an inferior/inferolateral infarct with minimal peri-infarct ischemia. Non-gated study.   Per Dr. Margaretann Loveless regarding the above: Though this is a low risk stress test, her results suggest possible prior MI, and she does not have a known history of CAD. In the setting of multiple HF admissions, she may need a coronary angiogram to further assess. This needs to be discussed in person in the office with interpreter service  present, she has an appt with my colleague Angie Rayvon Brandvold next week, where that discussion can be had vs optimizing medical therapy.   EKG:  EKG is ordered today.  The ekg ordered today demonstrates atrial fibrillation with ventricular rate 50, poor R wave progression  Recent Labs: 04/01/2019: ALT 8; BUN 18; Creat 0.65; Hemoglobin 10.6; Platelets 138; Potassium 4.8; Sodium 140; TSH 2.38  Recent Lipid Panel No results found for: CHOL, TRIG, HDL, CHOLHDL, VLDL, LDLCALC, LDLDIRECT  Physical Exam:    VS:  BP (!) 142/58   Pulse (!) 50   Ht 5\' 5"  (1.651 m)   Wt 161 lb (73 kg)   SpO2 96%   BMI 26.79 kg/m     Wt Readings from Last 3 Encounters:  06/12/19 161 lb (73 kg)  05/30/19 166 lb (75.3 kg)  05/16/19 166 lb (75.3 kg)     GEN: Well nourished, well developed in no acute distress HEENT: Normal NECK: No JVD; No carotid bruits CARDIAC: irregular rhythm, bradycardic rate, systolic murmur 3/6 RESPIRATORY:  Respirations unlabored, occasional wheeze in right base ABDOMEN: Soft, non-tender, non-distended MUSCULOSKELETAL:  Mild B LE edema; No deformity  SKIN: Warm and dry NEUROLOGIC:  Alert and oriented x 3 PSYCHIATRIC:  Normal affect   ASSESSMENT:    1. Chest pain, unspecified type   2. Chronic diastolic heart failure ([place])   3. Atrial fibrillation, unspecified type ([place])   4. Aortic valve stenosis, etiology of cardiac valve disease unspecified   5. Severe aortic regurgitation   6. Chronic anticoagulation    PLAN:    In order of problems listed above:  Chest pain Stress test with signs of prior MI. She continues to have CP and SOB. She is active around the house and has not had any falls. She may have some mild dementia. Discussed heart catheterization process, including risks and benefits. They would like to discuss with the rest of the family members before making a decision. Would favor right and left heart catheterization as well as evaluating mixed valve disease.  Chronic  diastolic heart failure Echo with low-normal EF of 50-55%. Unable to fully assess diastolic function. She is doing well on spiro and lasix.    Aortic stenosis Severe aortic regurgitation Re-evaluate in 6 months with echocardiogram vs angiography. She does report SOB, mostly DOE.    Persistent atrial fibrillation Slow ventricular response - 50 bpm today. Started on xarelto 20 mg for [facility] and is doing well, no bleeding problems.  This patients CHA2DS2-VASc Score and unadjusted Ischemic Stroke Rate (% per year) is equal to 4.8 % stroke rate/year from a score of 33 (age, female, CHF)   Hyperlipidemia On 20 mg lipitor Collect fasting lipids when she returns for labs.    Will plan to follow up in 3 months. They will discuss and let me know how they would like to proceed with the heart catheterization.    Medication Adjustments/Labs and Tests Ordered: Current medicines are reviewed at length with the patient today.  Concerns regarding medicines are outlined above.  Orders Placed This Encounter  Procedures  . EKG 12-Lead   No orders of the defined types were placed in this encounter.   Signed, Ledora Bottcher, PA  06/12/2019 2:25 PM    [facility]

## 2019-06-10 NOTE — H&P (View-Only) (Signed)
Cardiology Office Note:    Date:  06/12/2019   ID:  Valerie [name], DOB 09/11/1942, MRN [id number]  PCP:  Lauree Chandler, NP  Cardiologist:  [name], MD   Referring MD: Lauree Chandler, NP   Chief Complaint  Patient presents with  . Chest Pain    History of Present Illness:    Valerie [name] is a 78 y.o. female with a hx of atrial fibrillation with slow ventricular response not on AC, HLD, CHF with multiple exacerbation with ongoing NYHA class II symptoms, recent echo with EF of 50-55%, hx of lung cancer in remission (s/p radiation), and dementia. She recently moved to [facility] and established car with Dr. Margaretann Loveless. She was hospitalized Nov 2019 with PNA, pulmonary edema, and cardiomyopathy. She did not require intubation or ICU care. She was initially on coumadin for Afib, but had difficulty with frequent INR checks, Transitioned to xarelto, but cost prohibitive. She was maintained on 81 mg ASA. AC was discussed at her clinic visit with Dr. Margaretann Loveless 05/16/19 for a CHA2DS2-VASc of 4 (age, CHF, female). She reported chest pain and subsequently underwent echocardiogram and myoview stress test. Echocardiogram with increased LV filling pressure, severe biatrial enlargement, mild mitral regurgitation, moderate AS and severe aortic valve regurgitation. EF was 50-55%.  Stress test showed evidence of inferior/inferolateral infarct with minimal peri-infarct ischemia. Given that she has had multiple HF admission and her nuc suggests prior MI, she may need a right and left heart cath.    She presents today to discuss her recent studies. She is here with her daughter who helps with interpretation.  She has been taking 20 mg lasix, 25 mg spironolactone, and xarelto. I had a lengthy discussion about her stress test and echocardiogram. She does continue to complain of left-sided chest pain and dyspnea on exertion. She sleeps inclined on a hospital bed for back pain and for bleeding. Her daughter states that  given her dementia, she is likely under-reporting her symptoms, or reports symptoms days later. I think she down-plays her symptoms.    Past Medical History:  Diagnosis Date  . Alzheimer disease [facility])    Per new patient packet- [facility]   . Dementia [facility])    Per new patient packet- [facility]   . Low oxygen saturation    Per new patient packet- [facility]   . Lung cancer [facility])    Per new patient packet- [facility]   . Pneumonia    Per new patient packet- [facility]     Past Surgical History:  Procedure Laterality Date  . PARTIAL HYSTERECTOMY     Still with ovaries, Per new patient packet- [facility]     Current Medications: Current Meds  Medication Sig  . aspirin EC 81 MG tablet Take 81 mg by mouth daily.  Marland Kitchen atorvastatin (LIPITOR) 20 MG tablet Take 20 mg by mouth daily.  . Cholecalciferol (VITAMIN D3 SUPER STRENGTH) 50 MCG (2000 UT) TABS Take by mouth daily at 8 pm.  . furosemide (LASIX) 20 MG tablet Take 20 mg by mouth daily.  . OXYGEN Inhale 3 L into the lungs continuous.  . rivaroxaban (XARELTO) 20 MG TABS tablet Take 1 tablet (20 mg total) by mouth daily with supper.  Marland Kitchen spironolactone (ALDACTONE) 25 MG tablet Take 25 mg by mouth daily.  . vitamin B-12 (CYANOCOBALAMIN) 1000 MCG tablet Take 1,000 mcg by mouth daily.  . vitamin C (ASCORBIC ACID) 500 MG tablet Take 500 mg by mouth daily.  . Vitamins-Lipotropics (LIPO-FLAVONOID PLUS PO) Take  1,000 mg by mouth daily.     Allergies:   Patient has no known allergies.   Social History   Socioeconomic History  . Marital status: Married    Spouse name: Not on file  . Number of children: Not on file  . Years of education: Not on file  . Highest education level: Not on file  Occupational History  . Not on file  Social Needs  . Financial resource strain: Not on file  . Food insecurity    Worry: Not on file    Inability: Not on file  . Transportation needs    Medical: Not on file    Non-medical: Not on file  Tobacco Use  . Smoking status: Never Smoker  .  Smokeless tobacco: Never Used  Substance and Sexual Activity  . Alcohol use: Never    Frequency: Never  . Drug use: Never  . Sexual activity: Not on file  Lifestyle  . Physical activity    Days per week: Not on file    Minutes per session: Not on file  . Stress: Not on file  Relationships  . Social Herbalist on phone: Not on file    Gets together: Not on file    Attends religious service: Not on file    Active member of club or organization: Not on file    Attends meetings of clubs or organizations: Not on file    Relationship status: Not on file  Other Topics Concern  . Not on file  Social History Narrative   Per [facility] new patient packet abstracted 03/28/2019      Diet: Vegetarian       Caffeine: None      Married, if yes what year: Yes, 1959      Do you live in a house, apartment, assisted living, condo, trailer, ect: House, 2 stories, 5 persons      Pets: Dog      Highest level of education: 6th grade       Current/Past profession: N/A      Exercise:No         Living Will: No   DNR: No   POA/HPOA: No      Functional Status:   Do you have difficulty bathing or dressing yourself? No   Do you have difficulty preparing food or eating? Yes   Do you have difficulty managing your medications? Yes   Do you have difficulty managing your finances? Yes   Do you have difficulty affording your medications? Nes     Family History: The patient's family history includes Alzheimer's disease in her father and mother.  ROS:   Please see the history of present illness.     All other systems reviewed and are negative.  EKGs/Labs/Other Studies Reviewed:    The following studies were reviewed today:  Echo 05/22/19: 1. The left ventricle has low normal systolic function, with an ejection fraction of 50-55%. The cavity size was normal. There is moderately increased left ventricular wall thickness. Left ventricular diastolic function could not be evaluated secondary   to atrial fibrillation. Elevated left atrial and left ventricular end-diastolic pressures The E/e' is >20.  2. The right ventricle has normal systolic function. The cavity was normal. There is mildly increased right ventricular wall thickness.  3. Left atrial size was severely dilated.  4. Right atrial size was severely dilated.  5. The mitral valve is abnormal. Mild thickening of the mitral valve leaflet. Mitral valve regurgitation  is moderate by color flow Doppler.  6. The tricuspid valve is grossly normal.  7. The aortic valve is abnormal. Severe calcifcation of the aortic valve. Aortic valve regurgitation is severe by color flow Doppler. Moderate-severe stenosis of the aortic valve.  8. The aorta is abnormal in size and structure.  9. There is mild dilatation of the ascending aorta measuring 39 mm. 10. The inferior vena cava was dilated in size with >50% respiratory variability. 11. Evidence of atrial level shunting detected by color flow Doppler. 12. Small patent foramen ovale with predominantly left to right shunting across the atrial septum.   Lexiscan myoview 05/30/19:  There was no ST segment deviation noted during stress.  No T wave inversion was noted during stress.  Defect 1: There is a medium defect of moderate severity present in the basal inferior, basal inferolateral, mid inferior and mid inferolateral location.  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  This is a low risk study.   Low risk stress nuclear study with an inferior/inferolateral infarct with minimal peri-infarct ischemia. Non-gated study.   Per Dr. Margaretann Loveless regarding the above: Though this is a low risk stress test, her results suggest possible prior MI, and she does not have a known history of CAD. In the setting of multiple HF admissions, she may need a coronary angiogram to further assess. This needs to be discussed in person in the office with interpreter service  present, she has an appt with my colleague Angie Talli Kimmer next week, where that discussion can be had vs optimizing medical therapy.   EKG:  EKG is ordered today.  The ekg ordered today demonstrates atrial fibrillation with ventricular rate 50, poor R wave progression  Recent Labs: 04/01/2019: ALT 8; BUN 18; Creat 0.65; Hemoglobin 10.6; Platelets 138; Potassium 4.8; Sodium 140; TSH 2.38  Recent Lipid Panel No results found for: CHOL, TRIG, HDL, CHOLHDL, VLDL, LDLCALC, LDLDIRECT  Physical Exam:    VS:  BP (!) 142/58   Pulse (!) 50   Ht 5\' 5"  (1.651 m)   Wt 161 lb (73 kg)   SpO2 96%   BMI 26.79 kg/m     Wt Readings from Last 3 Encounters:  06/12/19 161 lb (73 kg)  05/30/19 166 lb (75.3 kg)  05/16/19 166 lb (75.3 kg)     GEN: Well nourished, well developed in no acute distress HEENT: Normal NECK: No JVD; No carotid bruits CARDIAC: irregular rhythm, bradycardic rate, systolic murmur 3/6 RESPIRATORY:  Respirations unlabored, occasional wheeze in right base ABDOMEN: Soft, non-tender, non-distended MUSCULOSKELETAL:  Mild B LE edema; No deformity  SKIN: Warm and dry NEUROLOGIC:  Alert and oriented x 3 PSYCHIATRIC:  Normal affect   ASSESSMENT:    1. Chest pain, unspecified type   2. Chronic diastolic heart failure ([place])   3. Atrial fibrillation, unspecified type ([place])   4. Aortic valve stenosis, etiology of cardiac valve disease unspecified   5. Severe aortic regurgitation   6. Chronic anticoagulation    PLAN:    In order of problems listed above:  Chest pain Stress test with signs of prior MI. She continues to have CP and SOB. She is active around the house and has not had any falls. She may have some mild dementia. Discussed heart catheterization process, including risks and benefits. They would like to discuss with the rest of the family members before making a decision. Would favor right and left heart catheterization as well as evaluating mixed valve disease.  Chronic  diastolic heart failure Echo with low-normal EF of 50-55%. Unable to fully assess diastolic function. She is doing well on spiro and lasix.    Aortic stenosis Severe aortic regurgitation Re-evaluate in 6 months with echocardiogram vs angiography. She does report SOB, mostly DOE.    Persistent atrial fibrillation Slow ventricular response - 50 bpm today. Started on xarelto 20 mg for [facility] and is doing well, no bleeding problems.  This patients CHA2DS2-VASc Score and unadjusted Ischemic Stroke Rate (% per year) is equal to 4.8 % stroke rate/year from a score of 92 (age, female, CHF)   Hyperlipidemia On 20 mg lipitor Collect fasting lipids when she returns for labs.    Will plan to follow up in 3 months. They will discuss and let me know how they would like to proceed with the heart catheterization.    Medication Adjustments/Labs and Tests Ordered: Current medicines are reviewed at length with the patient today.  Concerns regarding medicines are outlined above.  Orders Placed This Encounter  Procedures  . EKG 12-Lead   No orders of the defined types were placed in this encounter.   Signed, Ledora Bottcher, PA  06/12/2019 2:25 PM    [facility]

## 2019-06-12 ENCOUNTER — Other Ambulatory Visit: Payer: Self-pay

## 2019-06-12 ENCOUNTER — Telehealth: Payer: Self-pay | Admitting: Physician Assistant

## 2019-06-12 ENCOUNTER — Ambulatory Visit (INDEPENDENT_AMBULATORY_CARE_PROVIDER_SITE_OTHER): Payer: Medicare PPO | Admitting: Physician Assistant

## 2019-06-12 ENCOUNTER — Encounter: Payer: Self-pay | Admitting: Physician Assistant

## 2019-06-12 VITALS — BP 142/58 | HR 50 | Ht 65.0 in | Wt 161.0 lb

## 2019-06-12 DIAGNOSIS — I5032 Chronic diastolic (congestive) heart failure: Secondary | ICD-10-CM

## 2019-06-12 DIAGNOSIS — I4891 Unspecified atrial fibrillation: Secondary | ICD-10-CM

## 2019-06-12 DIAGNOSIS — Z7901 Long term (current) use of anticoagulants: Secondary | ICD-10-CM

## 2019-06-12 DIAGNOSIS — R079 Chest pain, unspecified: Secondary | ICD-10-CM

## 2019-06-12 DIAGNOSIS — I350 Nonrheumatic aortic (valve) stenosis: Secondary | ICD-10-CM | POA: Diagnosis not present

## 2019-06-12 DIAGNOSIS — I351 Nonrheumatic aortic (valve) insufficiency: Secondary | ICD-10-CM

## 2019-06-12 NOTE — Patient Instructions (Addendum)
Medication Instructions:  Your physician recommends that you continue on your current medications as directed. Please refer to the Current Medication list given to you today.  If you need a refill on your cardiac medications before your next appointment, please call your pharmacy.    Follow-Up: At [facility], you and your health needs are our priority.  As part of our continuing mission to provide you with exceptional heart care, we have created designated Provider Care Teams.  These Care Teams include your primary Cardiologist (physician) and Advanced Practice Providers (APPs -  Physician Assistants and Nurse Practitioners) who all work together to provide you with the care you need, when you need it. . You have been scheduled for a follow-up appointment with Dr. Cherlynn Kaiser on Monday, 09/30/19 at 1:20 PM.  Any Other Special Instructions Will Be Listed Below (If Applicable).  Risks for heart catheterization:  The patient understands that risks included but are not limited to stroke (1 in 1000), death (1 in 1000), kidney failure [usually temporary] (1 in 500), bleeding (1 in 200), allergic reaction [possibly serious] (1 in 200).  The patient understands and agrees to proceed.   ___________________________________________________________________________________________________    Coronary Angiogram With Stent Coronary angiogram with stent placement is a procedure to widen or open a narrow blood vessel of the heart (coronary artery). Arteries may become blocked by cholesterol buildup (plaques) in the lining of the wall. When a coronary artery becomes partially blocked, blood flow to that area decreases. This may lead to chest pain or a heart attack (myocardial infarction). A stent is a small piece of metal that looks like mesh or a spring. Stent placement may be done as treatment for a heart attack or right after a coronary angiogram in which a blocked artery is found. Let your health  care provider know about:  Any allergies you have.  All medicines you are taking, including vitamins, herbs, eye drops, creams, and over-the-counter medicines.  Any problems you or family members have had with anesthetic medicines.  Any blood disorders you have.  Any surgeries you have had.  Any medical conditions you have.  Whether you are pregnant or may be pregnant. What are the risks? Generally, this is a safe procedure. However, problems may occur, including:  Damage to the heart or its blood vessels.  A return of blockage.  Bleeding, infection, or bruising at the insertion site.  A collection of blood under the skin (hematoma) at the insertion site.  A blood clot in another part of the body.  Kidney injury.  Allergic reaction to the dye or contrast that is used.  Bleeding into the abdomen (retroperitoneal bleeding). What happens before the procedure? Staying hydrated Follow instructions from your health care provider about hydration, which may include:  Up to 2 hours before the procedure - you may continue to drink clear liquids, such as water, clear fruit juice, black coffee, and plain tea.  Eating and drinking restrictions Follow instructions from your health care provider about eating and drinking, which may include:  8 hours before the procedure - stop eating heavy meals or foods such as meat, fried foods, or fatty foods.  6 hours before the procedure - stop eating light meals or foods, such as toast or cereal.  2 hours before the procedure - stop drinking clear liquids. Ask your health care provider about:  Changing or stopping your regular medicines. This is especially important if you are taking diabetes medicines or blood thinners.  Taking medicines  such as ibuprofen. These medicines can thin your blood. Do not take these medicines before your procedure if your health care provider instructs you not to. Generally, aspirin is recommended before a  procedure of passing a small, thin tube (catheter) through a blood vessel and into the heart (cardiac catheterization). What happens during the procedure?   An IV tube will be inserted into one of your veins.  You will be given one or more of the following: ? A medicine to help you relax (sedative). ? A medicine to numb the area where the catheter will be inserted into an artery (local anesthetic).  To reduce your risk of infection: ? Your health care team will wash or sanitize their hands. ? Your skin will be washed with soap. ? Hair may be removed from the area where the catheter will be inserted.  Using a guide wire, the catheter will be inserted into an artery. The location may be in your groin, in your wrist, or in the fold of your arm (near your elbow).  A type of X-ray (fluoroscopy) will be used to help guide the catheter to the opening of the arteries in the heart.  A dye will be injected into the catheter, and X-rays will be taken. The dye will help to show where any narrowing or blockages are located in the arteries.  A tiny wire will be guided to the blocked spot, and a balloon will be inflated to make the artery wider.  The stent will be expanded and will crush the plaques into the wall of the vessel. The stent will hold the area open and improve the blood flow. Most stents have a drug coating to reduce the risk of the stent narrowing over time.  The artery may be made wider using a drill, laser, or other tools to remove plaques.  When the blood flow is better, the catheter will be removed. The lining of the artery will grow over the stent, which stays where it was placed. This procedure may vary among health care providers and hospitals. What happens after the procedure?  If the procedure is done through the leg, you will be kept in bed lying flat for about 6 hours. You will be instructed to not bend and not cross your legs.  The insertion site will be checked  frequently.  The pulse in your foot or wrist will be checked frequently.  You may have additional blood tests, X-rays, and a test that records the electrical activity of your heart (electrocardiogram, or ECG). This information is not intended to replace advice given to you by your health care provider. Make sure you discuss any questions you have with your health care provider. Document Released: 04/16/2003 Document Revised: 01/19/2018 Document Reviewed: 05/15/2016 Elsevier Patient Education  2020 [place].   _____________________________________________________________________________________________   Valerie [name] con colocacin de stent Coronary Angiogram With Stent Un angiograma coronario con colocacin de stent es un procedimiento que se realiza para Market researcher o abrir un vaso sanguneo del corazn (arteria coronaria) que se ha Engineer, materials. Las arterias pueden bloquearse por acumulacin de colesterol (placas) en el revestimiento interior de la pared. Cuando una arteria coronaria se obstruye parcialmente, el flujo sanguneo disminuye en esa zona. Esto puede causar dolor en el pecho o un ataque cardaco (infarto de miocardio). Un stent es una pequea pieza de metal similar a [place] o a un resorte. La colocacin del stent puede hacerse como tratamiento por un infarto de miocardio o inmediatamente despus de  un angiograma en el que se descubre una arteria obstruida. Informe a su mdico acerca de lo siguiente:  Cualquier alergia que tenga.  Todos los [employer], incluidos vitaminas, hierbas, gotas oftlmicas, cremas y medicamentos de venta libre.  Cualquier problema previo que usted o algn miembro de su familia haya tenido con los anestsicos.  Cualquier trastorno de la sangre que tenga.  Cirugas a las que se haya sometido.  Cualquier afeccin mdica que tenga.  Si est embarazada o podra estarlo. Cules son los riesgos? En general, se trata de un  procedimiento seguro. Sin embargo, pueden ocurrir complicaciones, por ejemplo:  Lesiones en el corazn o en los vasos sanguneos.  Que la arteria vuelva a obstruirse.  Sangrado, infeccin o hematomas en el lugar de la insercin.  Una acumulacin de sangre debajo de la piel (hematoma) en el lugar de la insercin.  Un cogulo de sangre en otra parte del organismo.  Lesin renal.  Reaccin alrgica al tinte (sustancia de [place]).  Sangrado en el abdomen (sangrado retroperitoneal). Qu ocurre antes del procedimiento? Hidratacin Siga las indicaciones del mdico acerca de mantenerse hidratado, las cuales pueden incluir lo siguiente:  Hasta 2horas antes del procedimiento, puede beber lquidos claros, como agua, jugos de fruta sin pulpa, caf negro y t solo.  Restricciones en las comidas y bebidas Siga las instrucciones del mdico respecto de las restricciones en las comidas o las bebidas, las cuales pueden incluir lo siguiente:  8 horas antes del procedimiento, deje de ingerir comidas o alimentos pesados, por ejemplo, carne, alimentos fritos o alimentos grasos.  6 horas antes del procedimiento, deje de ingerir comidas o alimentos livianos, como tostadas o cereales.  2 horas antes del procedimiento, deje de beber lquidos claros. Consulte al mdico sobre:  Quarry manager o suspender los medicamentos que toma habitualmente. Esto es muy importante si toma medicamentos para la diabetes o anticoagulantes.  Tomar medicamentos, como ibuprofeno. Estos medicamentos pueden tener un efecto anticoagulante en la [place]. No tome estos medicamentos antes del procedimiento si el mdico le indica que no lo haga. Por lo general, la aspirina se recomienda antes de un procedimiento que Warden/ranger pasar un tubo pequeo y delgado (catter) a travs de un vaso sanguneo hasta llegar al corazn (cateterismo cardaco). Qu ocurre durante el procedimiento?   Le colocarn un tubo (catter) intravenoso  en una de las venas.  Le administrarn uno o ms de los siguientes medicamentos: ? Un medicamento para ayudarlo a relajarse (sedante). ? Un medicamento para adormecer la zona en la que se insertar el catter (anestesia local).  Para disminuir el riesgo de contraer una infeccin: ? El equipo mdico se lavar o se Transport planner. ? Le lavarn la piel con jabn. ? Pueden rasurarle la zona donde se insertar el catter.  El catter se insertar en una arteria con un alambre gua. El Environmental consultant de insercin puede ser en la ingle, en la [place] o en el pliegue del brazo (cerca del codo).  Se utilizar un tipo de radiografa (fluoroscopia) para guiar el catter [employer] abertura de las arterias del corazn.  Se inyectar un tinte por el catter, y se tomarn radiografas. El tinte mostrar si hay estrechamientos u obstrucciones en las arterias.  Se guiar un alambre pequeo hasta la zona obstruida y se inflar un baln para ensanchar la arteria.  El stent se expandir y presionar la placa contra la pared del vaso sanguneo. El stent mantendr la zona abierta y mejorar el flujo sanguneo. La  mayora de los stents estn revestidos con un frmaco para reducir Catering manager de estrechamiento del stent con el paso del tiempo.  La arteria puede ensancharse usando un taladro para uso quirrgico, un rayo lser u otras herramientas para Chief Executive Officer.  Una vez que el flujo sanguneo mejore, el catter se Charity fundraiser. El revestimiento interno de la arteria crecer sobre el stent, que Nutritional therapist en el que fue colocado. Este procedimiento puede variar segn el mdico y el hospital. Sander Nephew ocurre despus del procedimiento?  Si el procedimiento se realiza a travs de la pierna, deber Community education officer en la cama durante aproximadamente 6 horas. Le indicarn que no flexione ni cruce las piernas.  El lugar de la insercin se controlar con frecuencia.  Le controlarn con frecuencia el pulso en  el pie o en la [place].  Le pueden realizar anlisis de [place], radiografas y un estudio que registra la actividad elctrica del corazn (electrocardiograma o ECG). Esta informacin no tiene Marine scientist el consejo del mdico. Asegrese de hacerle al mdico cualquier pregunta que tenga. Document Released: 01/25/2007 Document Revised: 03/05/2018 Document Reviewed: 05/15/2016 Elsevier Patient Education  2020 [employer].

## 2019-06-12 NOTE — Telephone Encounter (Signed)
Spoke to pt daughter. She stated she will be coming to appt today with mother at 1:15 to help interpret.

## 2019-06-14 ENCOUNTER — Encounter: Payer: Self-pay | Admitting: Physician Assistant

## 2019-06-14 ENCOUNTER — Telehealth: Payer: Self-pay | Admitting: Physician Assistant

## 2019-06-14 ENCOUNTER — Other Ambulatory Visit: Payer: Self-pay | Admitting: Physician Assistant

## 2019-06-14 DIAGNOSIS — R079 Chest pain, unspecified: Secondary | ICD-10-CM

## 2019-06-14 NOTE — Telephone Encounter (Signed)
Chart reviewed with Dr. Harrington Challenger, covering Dr. Delphina Cahill inbox. We discussed moving forward with right and left heart cath, which will also evaluate her aortic valve.  The patient has discussed this with her family and wishes to proceed.    Ledora Bottcher, PA-C 06/14/2019, 1:21 PM [facility] [address] [place], [place] [address]

## 2019-06-14 NOTE — Telephone Encounter (Signed)
Returned call to [employer], (daughter)  She states that she has discussed heart cath with siblings and they have agreed to go forward with right and left heart catheterization. She would like to proceed with scheduling. She states tat any day will do they do not have any preference

## 2019-06-14 NOTE — Telephone Encounter (Signed)
Spoke to pt daughter. Cath scheduled for Friday, 06/21/19 at 9 AM with Dr. [name]. COVID pre-screen scheduled for Tuesday, 8/25 at 10:10 AM. Pt daughter will bring pt for pre-cath blood work on Monday 8/24 at [place] office. Letter with all instructions will be mailed to pt.

## 2019-06-14 NOTE — Telephone Encounter (Signed)
New message:     Patient calling stating some one called her. Please call patient back.

## 2019-06-14 NOTE — Telephone Encounter (Signed)
Chart reviewed with Dr. Harrington Challenger, covering Dr. Delphina Cahill inbox. We discussed moving forward with right and left heart cath, which will also evaluate her aortic valve.  The patient has discussed this with her family and wishes to proceed.    Ledora Bottcher, PA-C 06/14/2019, 1:21 PM [facility] [address] [place], [place] [address]

## 2019-06-17 LAB — BASIC METABOLIC PANEL
BUN/Creatinine Ratio: 17 (ref 12–28)
BUN: 12 mg/dL (ref 8–27)
CO2: 26 mmol/L (ref 20–29)
Calcium: 8.9 mg/dL (ref 8.7–10.3)
Chloride: 100 mmol/L (ref 96–106)
Creatinine, Ser: 0.7 mg/dL (ref 0.57–1.00)
GFR calc Af Amer: 97 mL/min/{1.73_m2} (ref >59)
GFR calc non Af Amer: 84 mL/min/{1.73_m2} (ref >59)
Glucose: 84 mg/dL (ref 65–99)
Potassium: 5 mmol/L (ref 3.5–5.2)
Sodium: 138 mmol/L (ref 134–144)

## 2019-06-17 LAB — CBC
Hematocrit: 32.8 % — ABNORMAL LOW (ref 34.0–46.6)
Hemoglobin: 10.8 g/dL — ABNORMAL LOW (ref 11.1–15.9)
MCH: 29.3 pg (ref 26.6–33.0)
MCHC: 32.9 g/dL (ref 31.5–35.7)
MCV: 89 fL (ref 79–97)
Platelets: 140 10*3/uL — ABNORMAL LOW (ref 150–450)
RBC: 3.69 x10E6/uL — ABNORMAL LOW (ref 3.77–5.28)
RDW: 12.3 % (ref 11.7–15.4)
WBC: 4.4 10*3/uL (ref 3.4–10.8)

## 2019-06-18 ENCOUNTER — Other Ambulatory Visit (HOSPITAL_COMMUNITY): Payer: Medicare PPO

## 2019-06-18 ENCOUNTER — Other Ambulatory Visit (HOSPITAL_COMMUNITY)
Admission: RE | Admit: 2019-06-18 | Discharge: 2019-06-18 | Disposition: A | Discharge status: Home / Self Care | Payer: Medicare PPO | Source: Ambulatory Visit | Attending: Cardiology | Admitting: Cardiology

## 2019-06-18 DIAGNOSIS — R079 Chest pain, unspecified: Secondary | ICD-10-CM | POA: Diagnosis not present

## 2019-06-18 DIAGNOSIS — Z01812 Encounter for preprocedural laboratory examination: Secondary | ICD-10-CM | POA: Diagnosis present

## 2019-06-18 DIAGNOSIS — Z20828 Contact with and (suspected) exposure to other viral communicable diseases: Secondary | ICD-10-CM | POA: Insufficient documentation

## 2019-06-18 LAB — SARS CORONAVIRUS 2 (TAT 6-24 HRS): SARS Coronavirus 2: NEGATIVE

## 2019-06-20 ENCOUNTER — Telehealth: Payer: Self-pay | Admitting: *Deleted

## 2019-06-20 NOTE — Telephone Encounter (Signed)
Pt contacted pre-catheterization scheduled at [facility] for: Friday June 21, 2019 9 AM Verified arrival time and place: [facility]) at: 7 AM   No solid food after midnight prior to cath, clear liquids until 5 AM day of procedure. Contrast allergy: no  Hold: Xarelto-pt instructions state DO NOT TAKE  Xarelto Wednesday, Thursday or Friday. Pt's daughter, Torry,  states pt took last dose of Xarelto AM 06/19/19-none since then and knows to hold until after procedure. Lasix-AM of procedure. Spironolactone-AM of procedure.  Except hold medications AM meds can be  taken pre-cath with sip of water including: ASA 81 mg   Confirmed patient has responsible person to drive home post procedure and observe 24 hours after arriving home: yes  Currently, due to Covid-19 pandemic, only one support person will be allowed with patient. Must be the same support person for that patient's entire stay, will be screened and required to wear a mask. They will be asked to wait in the waiting room for the duration of the patient's stay.  Patients are required to wear a mask when they enter the hospital.      COVID-19 Pre-Screening Questions:  . In the past 7 to 10 days have you had a cough,  shortness of breath, headache, congestion, fever (100 or greater) body aches, chills, sore throat, or sudden loss of taste or sense of smell? no . Have you been around anyone with known Covid 19? no . Have you been around anyone who is awaiting Covid 19 test results in the past 7 to 10 days? no . Have you been around anyone who has been exposed to Covid 19, or has mentioned symptoms of Covid 19 within the past 7 to 10 days? no   I reviewed procedure/mask/visitor, Covid-19 screening questions with patient's daughter, [place] ([place], she speaks and understands [place]), she verbalized understanding.

## 2019-06-21 ENCOUNTER — Encounter (HOSPITAL_COMMUNITY): Payer: Self-pay | Admitting: Cardiology

## 2019-06-21 ENCOUNTER — Ambulatory Visit (HOSPITAL_COMMUNITY)
Admission: RE | Admit: 2019-06-21 | Discharge: 2019-06-21 | Disposition: A | Discharge status: Home / Self Care | Payer: Medicare PPO | Attending: Cardiology | Admitting: Cardiology

## 2019-06-21 ENCOUNTER — Encounter (HOSPITAL_COMMUNITY)
Admission: RE | Disposition: A | Discharge status: Home / Self Care | Payer: Self-pay | Source: Home / Self Care | Attending: Cardiology

## 2019-06-21 ENCOUNTER — Other Ambulatory Visit: Payer: Self-pay

## 2019-06-21 DIAGNOSIS — I5032 Chronic diastolic (congestive) heart failure: Secondary | ICD-10-CM | POA: Insufficient documentation

## 2019-06-21 DIAGNOSIS — Z79899 Other long term (current) drug therapy: Secondary | ICD-10-CM | POA: Insufficient documentation

## 2019-06-21 DIAGNOSIS — Z7901 Long term (current) use of anticoagulants: Secondary | ICD-10-CM | POA: Diagnosis not present

## 2019-06-21 DIAGNOSIS — F0280 Dementia in other diseases classified elsewhere without behavioral disturbance: Secondary | ICD-10-CM | POA: Diagnosis not present

## 2019-06-21 DIAGNOSIS — I352 Nonrheumatic aortic (valve) stenosis with insufficiency: Secondary | ICD-10-CM | POA: Diagnosis present

## 2019-06-21 DIAGNOSIS — I4891 Unspecified atrial fibrillation: Secondary | ICD-10-CM | POA: Diagnosis not present

## 2019-06-21 DIAGNOSIS — Z820 Family history of epilepsy and other diseases of the nervous system: Secondary | ICD-10-CM | POA: Insufficient documentation

## 2019-06-21 DIAGNOSIS — I272 Pulmonary hypertension, unspecified: Secondary | ICD-10-CM | POA: Insufficient documentation

## 2019-06-21 DIAGNOSIS — E785 Hyperlipidemia, unspecified: Secondary | ICD-10-CM | POA: Insufficient documentation

## 2019-06-21 DIAGNOSIS — I429 Cardiomyopathy, unspecified: Secondary | ICD-10-CM | POA: Insufficient documentation

## 2019-06-21 DIAGNOSIS — Z9981 Dependence on supplemental oxygen: Secondary | ICD-10-CM | POA: Insufficient documentation

## 2019-06-21 DIAGNOSIS — G309 Alzheimer's disease, unspecified: Secondary | ICD-10-CM | POA: Insufficient documentation

## 2019-06-21 DIAGNOSIS — R0789 Other chest pain: Secondary | ICD-10-CM | POA: Insufficient documentation

## 2019-06-21 DIAGNOSIS — I351 Nonrheumatic aortic (valve) insufficiency: Secondary | ICD-10-CM

## 2019-06-21 DIAGNOSIS — Z7982 Long term (current) use of aspirin: Secondary | ICD-10-CM | POA: Diagnosis not present

## 2019-06-21 DIAGNOSIS — Z90711 Acquired absence of uterus with remaining cervical stump: Secondary | ICD-10-CM | POA: Diagnosis not present

## 2019-06-21 DIAGNOSIS — I4821 Permanent atrial fibrillation: Secondary | ICD-10-CM | POA: Diagnosis present

## 2019-06-21 DIAGNOSIS — R072 Precordial pain: Secondary | ICD-10-CM | POA: Diagnosis present

## 2019-06-21 HISTORY — PX: RIGHT/LEFT HEART CATH AND CORONARY ANGIOGRAPHY: CATH118266

## 2019-06-21 LAB — POCT I-STAT 7, (LYTES, BLD GAS, ICA,H+H)
Acid-Base Excess: 3 mmol/L — ABNORMAL HIGH (ref 0.0–2.0)
Bicarbonate: 30.2 mmol/L — ABNORMAL HIGH (ref 20.0–28.0)
Calcium, Ion: 1.21 mmol/L (ref 1.15–1.40)
HCT: 30 % — ABNORMAL LOW (ref 36.0–46.0)
Hemoglobin: 10.2 g/dL — ABNORMAL LOW (ref 12.0–15.0)
O2 Saturation: 100 %
Potassium: 4.2 mmol/L (ref 3.5–5.1)
Sodium: 135 mmol/L (ref 135–145)
TCO2: 32 mmol/L (ref 22–32)
pCO2 arterial: 58 mmHg — ABNORMAL HIGH (ref 32.0–48.0)
pH, Arterial: 7.324 — ABNORMAL LOW (ref 7.350–7.450)
pO2, Arterial: 189 mmHg — ABNORMAL HIGH (ref 83.0–108.0)

## 2019-06-21 LAB — POCT I-STAT EG7
Acid-Base Excess: 3 mmol/L — ABNORMAL HIGH (ref 0.0–2.0)
Bicarbonate: 30.6 mmol/L — ABNORMAL HIGH (ref 20.0–28.0)
Calcium, Ion: 1.23 mmol/L (ref 1.15–1.40)
HCT: 32 % — ABNORMAL LOW (ref 36.0–46.0)
Hemoglobin: 10.9 g/dL — ABNORMAL LOW (ref 12.0–15.0)
O2 Saturation: 80 %
Potassium: 4.2 mmol/L (ref 3.5–5.1)
Sodium: 140 mmol/L (ref 135–145)
TCO2: 32 mmol/L (ref 22–32)
pCO2, Ven: 59.1 mmHg (ref 44.0–60.0)
pH, Ven: 7.322 (ref 7.250–7.430)
pO2, Ven: 50 mmHg — ABNORMAL HIGH (ref 32.0–45.0)

## 2019-06-21 SURGERY — RIGHT/LEFT HEART CATH AND CORONARY ANGIOGRAPHY
Anesthesia: LOCAL

## 2019-06-21 MED ORDER — ONDANSETRON HCL 4 MG/2ML IJ SOLN: 4.0000 mg | Freq: Four times a day (QID) | INTRAMUSCULAR | Status: DC | PRN

## 2019-06-21 MED ORDER — SODIUM CHLORIDE 0.9% FLUSH: 3.0000 mL | Freq: Two times a day (BID) | INTRAVENOUS | Status: DC

## 2019-06-21 MED ORDER — HEPARIN (PORCINE) IN NACL 1000-0.9 UT/500ML-% IV SOLN
INTRAVENOUS | Status: AC
  Filled 2019-06-21: qty 1000

## 2019-06-21 MED ORDER — IOHEXOL 350 MG/ML SOLN
INTRAVENOUS | Status: DC | PRN
  Administered 2019-06-21: 165 mL via INTRA_ARTERIAL

## 2019-06-21 MED ORDER — FENTANYL CITRATE (PF) 100 MCG/2ML IJ SOLN
INTRAMUSCULAR | Status: AC
  Filled 2019-06-21: qty 2

## 2019-06-21 MED ORDER — SODIUM CHLORIDE 0.9 % WEIGHT BASED INFUSION: 1.0000 mL/kg/h | INTRAVENOUS | Status: DC

## 2019-06-21 MED ORDER — LIDOCAINE HCL (PF) 1 % IJ SOLN
INTRAMUSCULAR | Status: DC | PRN
  Administered 2019-06-21: 15 mL

## 2019-06-21 MED ORDER — SODIUM CHLORIDE 0.9 % IV SOLN: 250.0000 mL | INTRAVENOUS | Status: DC | PRN

## 2019-06-21 MED ORDER — SODIUM CHLORIDE 0.9% FLUSH: 3.0000 mL | INTRAVENOUS | Status: DC | PRN

## 2019-06-21 MED ORDER — ACETAMINOPHEN 325 MG PO TABS: 650.0000 mg | ORAL_TABLET | ORAL | Status: DC | PRN

## 2019-06-21 MED ORDER — HEPARIN (PORCINE) IN NACL 1000-0.9 UT/500ML-% IV SOLN
INTRAVENOUS | Status: DC | PRN
  Administered 2019-06-21 (×2): 500 mL

## 2019-06-21 MED ORDER — MIDAZOLAM HCL 2 MG/2ML IJ SOLN
INTRAMUSCULAR | Status: DC | PRN
  Administered 2019-06-21: 1 mg via INTRAVENOUS

## 2019-06-21 MED ORDER — HYDRALAZINE HCL 20 MG/ML IJ SOLN: 10.0000 mg | INTRAMUSCULAR | Status: DC | PRN

## 2019-06-21 MED ORDER — SODIUM CHLORIDE 0.9 % WEIGHT BASED INFUSION
3.0000 mL/kg/h | INTRAVENOUS | Status: AC
  Administered 2019-06-21: 08:00:00 3 mL/kg/h via INTRAVENOUS

## 2019-06-21 MED ORDER — MIDAZOLAM HCL 2 MG/2ML IJ SOLN
INTRAMUSCULAR | Status: AC
  Filled 2019-06-21: qty 2

## 2019-06-21 MED ORDER — RIVAROXABAN 20 MG PO TABS: 20.0000 mg | ORAL_TABLET | Freq: Every day | ORAL | 1 refills | Status: DC

## 2019-06-21 MED ORDER — ASPIRIN 81 MG PO CHEW: 81.0000 mg | CHEWABLE_TABLET | ORAL | Status: DC

## 2019-06-21 MED ORDER — SODIUM CHLORIDE 0.9 % WEIGHT BASED INFUSION: 1.0000 mL/kg/h | INTRAVENOUS | Status: AC

## 2019-06-21 MED ORDER — FENTANYL CITRATE (PF) 100 MCG/2ML IJ SOLN
INTRAMUSCULAR | Status: DC | PRN
  Administered 2019-06-21: 25 ug via INTRAVENOUS

## 2019-06-21 MED ORDER — LIDOCAINE HCL (PF) 1 % IJ SOLN
INTRAMUSCULAR | Status: AC
  Filled 2019-06-21: qty 30

## 2019-06-21 SURGICAL SUPPLY — 13 items
CATH EXPO 5F MPA-1 (CATHETERS) ×1 IMPLANT
CATH INFINITI 5FR AL1 (CATHETERS) ×1 IMPLANT
CATH INFINITI 5FR MULTPACK ANG (CATHETERS) ×1 IMPLANT
CATH SWAN GANZ 7F STRAIGHT (CATHETERS) ×1 IMPLANT
KIT HEART LEFT (KITS) ×2 IMPLANT
PACK CARDIAC CATHETERIZATION (CUSTOM PROCEDURE TRAY) ×2 IMPLANT
SHEATH PINNACLE 5F 10CM (SHEATH) ×1 IMPLANT
SHEATH PINNACLE 7F 10CM (SHEATH) ×1 IMPLANT
SHEATH PROBE COVER 6X72 (BAG) ×1 IMPLANT
SYR MEDRAD MARK 7 150ML (SYRINGE) ×1 IMPLANT
TRANSDUCER W/STOPCOCK (MISCELLANEOUS) ×2 IMPLANT
TUBING CIL FLEX 10 FLL-RA (TUBING) ×2 IMPLANT
WIRE EMERALD 3MM-J .035X150CM (WIRE) ×1 IMPLANT

## 2019-06-21 NOTE — Progress Notes (Signed)
Discharge instructions given to pt and her daughter both voice understanding.

## 2019-06-21 NOTE — Progress Notes (Addendum)
Site area: rt groin fa and fv sheaths pulled by Kathleene Hazel, RN Site Prior to Removal:  Level 0 Pressure Applied For:  20 minutes Manual:   yes Patient Status During Pull:  stable Post Pull Site:  Level 0 Post Pull Instructions Given:  yes Post Pull Pulses Present: rt dp palpable Dressing Applied:  Gauze and tegaderm Bedrest begins @ 1000 Comments: Interpreter with patient

## 2019-06-21 NOTE — Progress Notes (Signed)
Interpreter Joaquim Lai here to help with questions

## 2019-06-21 NOTE — Discharge Instructions (Signed)
Cuidado del sitio del radio Radial Site Care  Esta hoja le proporciona informacin sobre cmo cuidarse despus del procedimiento. El mdico tambin podr darle instrucciones ms especficas. Comunquese con su mdico si tiene problemas o preguntas. Qu puedo esperar despus del procedimiento? Despus del procedimiento, es comn [employer] siguientes sntomas:  Moretones y Social research officer, government a Programmer, applications de insercin del Holiday representative. Siga estas indicaciones en su casa: Medicamentos  [employer] de venta libre y los recetados solamente como se lo haya indicado el mdico. Cuidados de la zona de la insercin  Siga las indicaciones del mdico acerca de los cuidados del Environmental consultant de la insercin. Haga lo siguiente: ? Lvese las manos con agua y jabn antes de Quarry manager las vendas (vendajes). Use desinfectante para manos si no dispone de [place] y [place]. ? Cambie los vendajes como se lo haya indicado el mdico. ? No retire los puntos (suturas), la goma para cerrar la piel o las tiras [place]. Es posible que estos cierres cutneos Animal nutritionist en la piel durante 2semanas o ms. Si los bordes de las tiras adhesivas empiezan a despegarse y Therapist, sports, puede recortar los que estn sueltos. No retire las tiras [facility] por completo a menos que el mdico se lo indique.  Psychiatric nurse de insercin todos los das para descartar signos de infeccin. Est atento a los siguientes signos: ? Dolor, hinchazn o enrojecimiento. ? Lquido o sangre. ? Pus o mal olor. ? Calor.  No tome baos de inmersin, no nade ni use el jacuzzi hasta que el mdico lo autorice.  Puede ducharse entre 24y 48horas despus del procedimiento o como se lo haya indicado el mdico. ? Retire el vendaje y lave suavemente el lugar de la insercin con agua y jabn comn. ? Seque bien el rea con una toalla limpia dando golpecitos. ? No se frote el lugar. Al hacerlo, puede ocasionar sangrado.  No se aplique talcos ni  lociones en el lugar. [place] 24horas posteriores al procedimiento o segn las indicaciones del mdico: ? No flexione ni doble el brazo afectado. ? No empuje ni jale objetos pesados con el brazo afectado. ? No conduzca a su casa desde la clnica o el hospital. Puede conducir 24horas despus del procedimiento, a menos que el mdico le haya indicado lo contrario. ? No opere maquinaria ni herramientas elctricas.  No levante ningn objeto que pese ms de 10libras (4,5kg) o el lmite de peso que le hayan indicado, hasta que el mdico le diga que puede [place].  Pregntele al mdico cundo puede hacer lo siguiente: ? Regresar a la escuela o al Mat Carne. ? Reanudar las actividades fsicas o los deportes que practica habitualmente. ? Reanudar la actividad sexual. Instrucciones generales  Si el lugar de la insercin del catter comienza a Therapist, art, levante el brazo y Advertising account executive presin en el lugar con firmeza. Si la hemorragia no se detiene, pida ayuda de inmediato. Esto es Engineering geologist.  Si regres a Actor que se Nurse, learning disability procedimiento, un adulto responsable debe acompaarlo durante las primeras 24horas posteriores a la llegada a su casa.  Concurra a todas las visitas de control como se lo haya indicado el mdico. Esto es importante. Comunquese con un mdico si:  Tiene fiebre.  Tiene enrojecimiento, hinchazn o una secrecin amarillenta alrededor del lugar de la insercin. Solicite ayuda de inmediato si:  Tiene un dolor que no es habitual en el sitio del radio.  La zona de  insercin del catter se hincha muy rpido.  La zona de insercin sangra y el sangrado no se detiene cuando ejerce presin constante en la zona.  El brazo o la mano se le ponen plidos, fros o siente hormigueo o adormecimiento. Estos sntomas pueden representar un problema grave que constituye Engineer, maintenance (IT). No espere hasta que los sntomas desaparezcan. Solicite atencin  mdica de inmediato. Comunquese con el servicio de emergencias de su localidad (911 en los [place]). No conduzca por sus propios medios Principal Financial. Resumen  Despus del procedimiento, es frecuente tener moretones y Education officer, environmental a la Soil scientist de insercin del Holiday representative.  Siga las instrucciones del mdico acerca del cuidado de la herida en TEFL teacher de insercin radial. Observe la herida todos los [place] para descartar signos de infeccin.  No levante ningn objeto que pese ms de 10libras (4,5kg) o el lmite de peso que le hayan indicado, hasta que el mdico le diga que puede [place]. Esta informacin no tiene Marine scientist el consejo del mdico. Asegrese de hacerle al mdico cualquier pregunta que tenga. Document Released: 02/04/2011 Document Revised: 12/18/2017 Document Reviewed: 12/18/2017 Elsevier Patient Education  2020 [employer].

## 2019-06-21 NOTE — Interval H&P Note (Signed)
History and Physical Interval Note:  06/21/2019 8:23 AM  Valerie [name]  has presented today for surgery, with the diagnosis of chest pain.  The various methods of treatment have been discussed with the patient and family. After consideration of risks, benefits and other options for treatment, the patient has consented to  Procedure(s): RIGHT/LEFT HEART CATH AND CORONARY ANGIOGRAPHY (N/A) as a surgical intervention.  The patient's history has been reviewed, patient examined, no change in status, stable for surgery.  I have reviewed the patient's chart and labs.  Questions were answered to the patient's satisfaction.     Collier Salina [facility] 06/21/2019 8:23 AM

## 2019-06-24 ENCOUNTER — Other Ambulatory Visit: Payer: Self-pay | Admitting: Nurse Practitioner

## 2019-06-24 DIAGNOSIS — E785 Hyperlipidemia, unspecified: Secondary | ICD-10-CM

## 2019-06-25 ENCOUNTER — Other Ambulatory Visit: Payer: Medicare PPO

## 2019-06-25 ENCOUNTER — Other Ambulatory Visit: Payer: Self-pay

## 2019-06-25 DIAGNOSIS — E785 Hyperlipidemia, unspecified: Secondary | ICD-10-CM

## 2019-06-25 LAB — LIPID PANEL
Cholesterol: 139 mg/dL (ref <200)
HDL: 70 mg/dL (ref >=50)
LDL Cholesterol (Calc): 55 mg/dL (calc)
Non-HDL Cholesterol (Calc): 69 mg/dL (calc) (ref <130)
Total CHOL/HDL Ratio: 2 (calc) (ref <5.0)
Triglycerides: 49 mg/dL (ref <150)

## 2019-06-28 ENCOUNTER — Ambulatory Visit: Payer: Medicare PPO | Admitting: Nurse Practitioner

## 2019-06-28 ENCOUNTER — Other Ambulatory Visit: Payer: Medicare PPO

## 2019-07-03 ENCOUNTER — Ambulatory Visit: Payer: Medicare PPO | Admitting: Nurse Practitioner

## 2019-07-04 ENCOUNTER — Other Ambulatory Visit: Payer: Self-pay

## 2019-07-04 ENCOUNTER — Encounter: Payer: Self-pay | Admitting: Cardiology

## 2019-07-04 ENCOUNTER — Ambulatory Visit (INDEPENDENT_AMBULATORY_CARE_PROVIDER_SITE_OTHER): Payer: Medicare PPO | Admitting: Cardiology

## 2019-07-04 VITALS — BP 135/60 | HR 54 | Temp 97.3°F | Ht 64.0 in | Wt 162.0 lb

## 2019-07-04 DIAGNOSIS — I4891 Unspecified atrial fibrillation: Secondary | ICD-10-CM | POA: Diagnosis not present

## 2019-07-04 DIAGNOSIS — I352 Nonrheumatic aortic (valve) stenosis with insufficiency: Secondary | ICD-10-CM

## 2019-07-04 DIAGNOSIS — Z9889 Other specified postprocedural states: Secondary | ICD-10-CM | POA: Diagnosis not present

## 2019-07-04 DIAGNOSIS — I5032 Chronic diastolic (congestive) heart failure: Secondary | ICD-10-CM | POA: Diagnosis not present

## 2019-07-04 NOTE — Patient Instructions (Signed)
Medication Instructions:  Your Physician recommend you continue on your current medication as directed.    If you need a refill on your cardiac medications before your next appointment, please call your pharmacy.   Lab work: None  Testing/Procedures: None  Follow-Up: Keep scheduled appointment with Dr. Margaretann Loveless

## 2019-07-04 NOTE — Progress Notes (Signed)
Cardiology Office Note:    Date:  07/04/2019   ID:  Valerie [name], DOB 09/11/1942, MRN [id number]  PCP:  Lauree Chandler, NP  Cardiologist:  Buford Dresser, MD PhD  Referring MD: Lauree Chandler, NP   CC: post cath follow up  History of Present Illness:    Valerie [name] is a 78 y.o. female who follows with Dr. Margaretann Loveless. She is here today to follow up post [facility] on 06/21/19.   Today: Here with her daughter. Tolerated cath without issue, R femoral site. Reviewed results of cath today with daughter and patient. Highlights include no CAD in left system, Right coronary not visualized (which correlates with area on lexiscan which was abnormal). Mild aortic stenosis, moderate to severe AR. Reviewed prior echo, noted severe AR but EF >50% and ESD <50 mm. Difficult to fully evaluate cause of her symptoms, below.  Cath results:  The left ventricular systolic function is normal.  LV end diastolic pressure is normal.  The left ventricular ejection fraction is 55-65% by visual estimate.  There is mild aortic valve stenosis.  Hemodynamic findings consistent with mild pulmonary hypertension.   1. Normal left coronary artery- very large dominant vessel. RCA could not be visualized. 2. Normal LV function 3. Mild aortic stenosis. Mean gradient 17 mm Hg. Valve area 1.85 cm squared with index 1.02.  4. Moderate to severe AI 5. Mild pulmonary HTN.  6. Normal LV filling pressures but prominent V wave on wedge tracing. 7. Normal cardiac output.  Still with shortness of breath, tires easily. This has been unchanged recently. LE edema much better than previously. No syncope, no chest pain.   We discussed further evaluation of the valve (TEE) vs. Watchful waiting. We are all in agreement to continue to monitor symptoms. She has a follow up with Dr.Acharya on 09/30/19 already scheduled, and they will monitor symptoms.   Past Medical History:  Diagnosis Date  . Alzheimer disease [facility])    Per  new patient packet- [facility]   . Dementia [facility])    Per new patient packet- [facility]   . Low oxygen saturation    Per new patient packet- [facility]   . Lung cancer [facility])    Per new patient packet- [facility]   . Pneumonia    Per new patient packet- [facility]     Past Surgical History:  Procedure Laterality Date  . PARTIAL HYSTERECTOMY     Still with ovaries, Per new patient packet- [facility]   . RIGHT/LEFT HEART CATH AND CORONARY ANGIOGRAPHY N/A 06/21/2019   Procedure: RIGHT/LEFT HEART CATH AND CORONARY ANGIOGRAPHY;  Surgeon: [name], [name], MD;  Location: [place] [facility];  Service: Cardiovascular;  Laterality: N/A;    Current Medications: Current Outpatient Medications on File Prior to Visit  Medication Sig  . aspirin EC 81 MG tablet Take 81 mg by mouth daily.  Marland Kitchen atorvastatin (LIPITOR) 20 MG tablet Take 20 mg by mouth daily.  . Cholecalciferol (VITAMIN D3 SUPER STRENGTH) 50 MCG (2000 UT) TABS Take 2,000 Units by mouth daily at 8 pm.   . furosemide (LASIX) 20 MG tablet Take 20 mg by mouth daily.  . OXYGEN Inhale 3 L into the lungs at bedtime.   . rivaroxaban (XARELTO) 20 MG TABS tablet Take 1 tablet (20 mg total) by mouth daily with supper.  Marland Kitchen spironolactone (ALDACTONE) 25 MG tablet Take 25 mg by mouth daily.  . vitamin B-12 (CYANOCOBALAMIN) 1000 MCG tablet Take 1,000 mcg by mouth daily.  . vitamin C (  ASCORBIC ACID) 500 MG tablet Take 500 mg by mouth daily.  . Vitamins-Lipotropics (LIPO-FLAVONOID PLUS PO) Take 1,000 mg by mouth daily.   No current facility-administered medications on file prior to visit.      Allergies:   Patient has no known allergies.   Social History   Socioeconomic History  . Marital status: Married    Spouse name: Not on file  . Number of children: Not on file  . Years of education: Not on file  . Highest education level: Not on file  Occupational History  . Not on file  Social Needs  . Financial resource strain: Not on file  . Food insecurity    Worry: Not on file     Inability: Not on file  . Transportation needs    Medical: Not on file    Non-medical: Not on file  Tobacco Use  . Smoking status: Never Smoker  . Smokeless tobacco: Never Used  Substance and Sexual Activity  . Alcohol use: Never    Frequency: Never  . Drug use: Never  . Sexual activity: Not on file  Lifestyle  . Physical activity    Days per week: Not on file    Minutes per session: Not on file  . Stress: Not on file  Relationships  . Social Herbalist on phone: Not on file    Gets together: Not on file    Attends religious service: Not on file    Active member of club or organization: Not on file    Attends meetings of clubs or organizations: Not on file    Relationship status: Not on file  Other Topics Concern  . Not on file  Social History Narrative   Per [facility] new patient packet abstracted 03/28/2019      Diet: Vegetarian       Caffeine: None      Married, if yes what year: Yes, 1959      Do you live in a house, apartment, assisted living, condo, trailer, ect: House, 2 stories, 5 persons      Pets: Dog      Highest level of education: 6th grade       Current/Past profession: N/A      Exercise:No         Living Will: No   DNR: No   POA/HPOA: No      Functional Status:   Do you have difficulty bathing or dressing yourself? No   Do you have difficulty preparing food or eating? Yes   Do you have difficulty managing your medications? Yes   Do you have difficulty managing your finances? Yes   Do you have difficulty affording your medications? Nes     Family History: The patient's family history includes Alzheimer's disease in her father and mother.  ROS:   Please see the history of present illness.  Additional pertinent ROS: Constitutional: Negative for chills, fever, night sweats, unintentional weight loss  HENT: Negative for ear pain and hearing loss.   Eyes: Negative for loss of vision and eye pain.  Respiratory: Negative for cough, sputum,  wheezing.   Cardiovascular: See HPI. Gastrointestinal: Negative for abdominal pain, melena, and hematochezia.  Genitourinary: Negative for dysuria and hematuria.  Musculoskeletal: Negative for falls and myalgias.  Skin: Negative for itching and rash.  Neurological: Negative for focal weakness, focal sensory changes and loss of consciousness.  Endo/Heme/Allergies: Does not bruise/bleed easily.     EKGs/Labs/Other Studies Reviewed:  The following studies were reviewed today: Cath, lexiscan, and echo all personally reviewed  EKG:  EKG is personally reviewed.  The ekg ordered today demonstrates afib with slow VR, PVC. HR 54.  Recent Labs: 04/01/2019: ALT 8; TSH 2.38 06/17/2019: BUN 12; Creatinine, Ser 0.70; Platelets 140 06/21/2019: Hemoglobin 10.9; Hemoglobin 10.2; Potassium 4.2; Potassium 4.2; Sodium 140; Sodium 135  Recent Lipid Panel    Component Value Date/Time   CHOL 139 06/25/2019 0934   TRIG 49 06/25/2019 0934   HDL 70 06/25/2019 0934   CHOLHDL 2.0 06/25/2019 0934   LDLCALC 55 06/25/2019 0934    Physical Exam:    VS:  BP 135/60   Pulse (!) 54   Temp (!) 97.3 F (36.3 C)   Ht 5\' 4"  (1.626 m)   Wt 162 lb (73.5 kg)   SpO2 93%   BMI 27.81 kg/m     Wt Readings from Last 3 Encounters:  07/04/19 162 lb (73.5 kg)  06/21/19 160 lb (72.6 kg)  06/12/19 161 lb (73 kg)    GEN: Well nourished, well developed in no acute distress HEENT: Normal, moist mucous membranes NECK: JVD just at clavicle at 30 degrees CARDIAC: slow, irregular rhythm, 2/6 SEM with preserved S2, 1/4 brief DM. VASCULAR: Radial and DP pulses 2+ bilaterally. No carotid bruits RESPIRATORY:  Clear to auscultation without rales, wheezing or rhonchi  ABDOMEN: Soft, non-tender, non-distended MUSCULOSKELETAL:  Ambulates independently SKIN: Warm and dry, no edema NEUROLOGIC:  Alert and oriented x 3. No focal neuro deficits noted. PSYCHIATRIC:  Normal affect    ASSESSMENT:    1. Status post left heart  catheterization (LHC)   2. Nonrheumatic aortic insufficiency with aortic stenosis   3. Atrial fibrillation, unspecified type ([place])   4. Chronic diastolic heart failure (HCC)    PLAN:    S/P LHC: reviewed results with images/3D model today with patient and her daughter. Left side circulation is dominant, no RCA visualized. This aligns with her lexiscan. Nothing to stent.  -medical management, on atorvastatin and aspirin -no significant chest pain today  Aortic regurgitation/aortic stenosis: imaging studies suggest severe AR and moderate AS. Unclear what of her symptoms are caused by this. Has had echo and cath evaluation. Discussed TEE, and as her symptoms are stable would prefer to monitor at this time. I agree with this. They have appt with Dr. Margaretann Loveless in 3 mos and will call if symptoms worsen before that time  Atrial fibrillation: slow rate response.  -on rivaroxaban -would recommend discuss aspirin and rivaroxaban at her follow up with Dr. Margaretann Loveless  Chronic diastolic heart failure: euvolemic today -continue furosemide, spironolactone  Plan for follow up: as scheduled with Dr. Margaretann Loveless  Medication Adjustments/Labs and Tests Ordered: Current medicines are reviewed at length with the patient today.  Concerns regarding medicines are outlined above.  Orders Placed This Encounter  Procedures  . EKG 12-Lead   No orders of the defined types were placed in this encounter.   Patient Instructions  Medication Instructions:  Your Physician recommend you continue on your current medication as directed.    If you need a refill on your cardiac medications before your next appointment, please call your pharmacy.   Lab work: None  Testing/Procedures: None  Follow-Up: Keep scheduled appointment with Dr. Margaretann Loveless       Signed, Buford Dresser, MD PhD 07/04/2019 1:34 PM    [place]

## 2019-07-15 ENCOUNTER — Encounter: Payer: Self-pay | Admitting: Nurse Practitioner

## 2019-07-15 ENCOUNTER — Ambulatory Visit (INDEPENDENT_AMBULATORY_CARE_PROVIDER_SITE_OTHER): Payer: Medicare PPO | Admitting: Nurse Practitioner

## 2019-07-15 ENCOUNTER — Other Ambulatory Visit: Payer: Self-pay

## 2019-07-15 VITALS — BP 134/72 | HR 42 | Temp 97.7°F | Ht 65.0 in | Wt 161.0 lb

## 2019-07-15 DIAGNOSIS — F0281 Dementia in other diseases classified elsewhere with behavioral disturbance: Secondary | ICD-10-CM

## 2019-07-15 DIAGNOSIS — I4891 Unspecified atrial fibrillation: Secondary | ICD-10-CM | POA: Diagnosis not present

## 2019-07-15 DIAGNOSIS — G309 Alzheimer's disease, unspecified: Secondary | ICD-10-CM

## 2019-07-15 DIAGNOSIS — I509 Heart failure, unspecified: Secondary | ICD-10-CM

## 2019-07-15 DIAGNOSIS — E782 Mixed hyperlipidemia: Secondary | ICD-10-CM

## 2019-07-15 DIAGNOSIS — Z23 Encounter for immunization: Secondary | ICD-10-CM

## 2019-07-15 DIAGNOSIS — H9312 Tinnitus, left ear: Secondary | ICD-10-CM | POA: Diagnosis not present

## 2019-07-15 DIAGNOSIS — Z85118 Personal history of other malignant neoplasm of bronchus and lung: Secondary | ICD-10-CM | POA: Diagnosis not present

## 2019-07-15 NOTE — Patient Instructions (Addendum)
Please sign record release for  Aleskerov (pulmonologist)  Another referral placed for ENT and oncologist at this time   Follow up in 6 months

## 2019-07-15 NOTE — Progress Notes (Signed)
Careteam: Patient Care Team: Lauree Chandler, NP as PCP - General (Geriatric Medicine) [name], MD as PCP - Cardiology (Cardiology) Papagikos, Venia Carbon, MD as Referring Physician (Radiation Oncology)  Advanced Directive information Does Patient Have a Medical Advance Directive?: No, Would patient like information on creating a medical advance directive?: Yes (MAU/Ambulatory/Procedural Areas - Information given)  No Known Allergies  Chief Complaint  Patient presents with  . Medical Management of Chronic Issues    3 month follow-up, labs mailed to patient, patient does not have questions about labs.   . Immunizations    Refused Flu vaccine   . Best Practice Recommendations    Discusss need for TDaP and PNA  . Quality Metric Gaps    Discusss need for DEXA     HPI: Patient is a 78 y.o. female seen in the office today for routine follow up.   Afib- rate controlled without medication, continues on asa only at this time but plans to get xarelto if affordable at pharmacy today.  CHF- stable on furosemide and spironolactone. No edema at this time.  S/p left heart cath due to chest pain- nothing to stent per cardiologist notes.  xarelto was ordered but ran out for a fib, atorvastatin, and asa   Reports she gets tired more easily which has been ongoing. Takes b12 supplement   Hyperlipidemia- continues on Lipitor daily, LDL at goal.   Tinnitus- referred to ENT but did not go because she wanted to try a supplement first but it was not effective. Plans to make a follow up appt.   Dementia- progressive decline. Continues to do ADLs. Unable to cook (has left the stove on before).    Review of Systems:  Review of Systems  Constitutional: Negative for chills, fever and weight loss.  HENT: Positive for tinnitus.   Respiratory: Negative for cough, sputum production and shortness of breath.   Cardiovascular: Negative for chest pain, palpitations and leg swelling.   Gastrointestinal: Negative for abdominal pain, constipation, diarrhea and heartburn.  Genitourinary: Negative for dysuria, frequency and urgency.  Musculoskeletal: Negative for back pain, falls, joint pain and myalgias.  Skin: Negative.   Neurological: Negative for dizziness and headaches.  Psychiatric/Behavioral: Positive for memory loss. Negative for depression. The patient does not have insomnia.     Past Medical History:  Diagnosis Date  . Alzheimer disease [facility])    Per new patient packet- [facility]   . Dementia [facility])    Per new patient packet- [facility]   . Low oxygen saturation    Per new patient packet- [facility]   . Lung cancer [facility])    Per new patient packet- [facility]   . Pneumonia    Per new patient packet- [facility]    Past Surgical History:  Procedure Laterality Date  . PARTIAL HYSTERECTOMY     Still with ovaries, Per new patient packet- [facility]   . RIGHT/LEFT HEART CATH AND CORONARY ANGIOGRAPHY N/A 06/21/2019   Procedure: RIGHT/LEFT HEART CATH AND CORONARY ANGIOGRAPHY;  Surgeon: [name], [name], MD;  Location: [place] [facility];  Service: Cardiovascular;  Laterality: N/A;   Social History:   reports that she has never smoked. She has never used smokeless tobacco. She reports that she does not drink alcohol or use drugs.  Family History  Problem Relation Age of Onset  . Alzheimer's disease Mother   . Alzheimer's disease Father     Medications: Patient's Medications  New Prescriptions   No medications on file  Previous Medications   ASPIRIN EC 81 MG TABLET    Take 81 mg by mouth daily.   ATORVASTATIN (LIPITOR) 20 MG TABLET    Take 20 mg by mouth daily.   CHOLECALCIFEROL (VITAMIN D3 SUPER STRENGTH) 50 MCG (2000 UT) TABS    Take 2,000 Units by mouth daily at 8 pm.    FUROSEMIDE (LASIX) 20 MG TABLET    Take 20 mg by mouth daily.   OXYGEN    Inhale 3 L into the lungs at bedtime.    RIVAROXABAN (XARELTO) 20 MG TABS TABLET    Take 1 tablet (20 mg total) by mouth daily with supper.    SPIRONOLACTONE (ALDACTONE) 25 MG TABLET    Take 25 mg by mouth daily.   VITAMIN B-12 (CYANOCOBALAMIN) 1000 MCG TABLET    Take 1,000 mcg by mouth daily.   VITAMIN C (ASCORBIC ACID) 500 MG TABLET    Take 500 mg by mouth daily.   VITAMINS-LIPOTROPICS (LIPO-FLAVONOID PLUS PO)    Take 1,000 mg by mouth daily.  Modified Medications   No medications on file  Discontinued Medications   No medications on file    Physical Exam:  Vitals:   07/15/19 1450  BP: 134/72  Pulse: (!) 42  Temp: 97.7 F (36.5 C)  TempSrc: Temporal  SpO2: 95%  Weight: 161 lb (73 kg)  Height: 5\' 5"  (1.651 m)   Body mass index is 26.79 kg/m. Wt Readings from Last 3 Encounters:  07/15/19 161 lb (73 kg)  07/04/19 162 lb (73.5 kg)  06/21/19 160 lb (72.6 kg)    Physical Exam Constitutional:      General: She is not in acute distress.    Appearance: She is well-developed. She is not diaphoretic.  HENT:     Head: Normocephalic and atraumatic.     Left Ear: Tympanic membrane normal.     Nose: Nose normal.     Mouth/Throat:     Pharynx: No oropharyngeal exudate.  Eyes:     Conjunctiva/sclera: Conjunctivae normal.     Pupils: Pupils are equal, round, and reactive to light.  Neck:     Musculoskeletal: Normal range of motion and neck supple.  Cardiovascular:     Rate and Rhythm: Regular rhythm. Bradycardia present.     Heart sounds: Normal heart sounds.  Pulmonary:     Effort: Pulmonary effort is normal.     Breath sounds: Normal breath sounds.  Abdominal:     General: Bowel sounds are normal.     Palpations: Abdomen is soft.  Musculoskeletal:        General: No tenderness.  Skin:    General: Skin is warm and dry.     Findings: No bruising.  Neurological:     Mental Status: She is alert and oriented to person, place, and time.     Labs reviewed: Basic Metabolic Panel: Recent Labs    04/01/19 1458 06/17/19 1149 06/21/19 0850  NA 140 138 135  140  K 4.8 5.0 4.2  4.2  CL 101 100  --   CO2 36*  26  --   GLUCOSE 87 84  --   BUN 18 12  --   CREATININE 0.65 0.70  --   CALCIUM 9.5 8.9  --   TSH 2.38  --   --    Liver Function Tests: Recent Labs    04/01/19 1458  AST 15  ALT 8  BILITOT 0.6  PROT 7.4   No results for input(s):  LIPASE, AMYLASE in the last 8760 hours. No results for input(s): AMMONIA in the last 8760 hours. CBC: Recent Labs    04/01/19 1458 06/17/19 1152 06/21/19 0850  WBC 5.1 4.4  --   NEUTROABS 3,106  --   --   HGB 10.6* 10.8* 10.2*  10.9*  HCT 33.3* 32.8* 30.0*  32.0*  MCV 91.5 89  --   PLT 138* 140*  --    Lipid Panel: Recent Labs    06/25/19 0934  CHOL 139  HDL 70  LDLCALC 55  TRIG 49  CHOLHDL 2.0   TSH: Recent Labs    04/01/19 1458  TSH 2.38   A1C: No results found for: HGBA1C   Assessment/Plan 1. Tinnitus of left ear -ongoing. Did not make appt previously due to ongoing cardiology work-up but would now like referral. - Ambulatory referral to ENT  2. Atrial fibrillation, unspecified type [facility]) Following with cardiologist at this time. Continues to be bradycardic.  Xarelto started for anticoagulation  - Pneumococcal polysaccharide vaccine 23-valent greater than or equal to 2yo subcutaneous/IM  3. Mixed hyperlipidemia LDL at goal on lipitor 20 mg daily - Pneumococcal polysaccharide vaccine 23-valent greater than or equal to 2yo subcutaneous/IM  4. Hx of cancer of lung - Ambulatory referral to Hematology / Oncology - Pneumococcal polysaccharide vaccine 23-valent greater than or equal to 2yo subcutaneous/IM  5. Alzheimer's dementia with behavioral disturbance, unspecified timing of dementia onset ([place]) -progressive decline, lives with daughter who helps her - Pneumococcal polysaccharide vaccine 23-valent greater than or equal to 2yo subcutaneous/IM  6. Congestive heart failure, unspecified HF chronicity, unspecified heart failure type ([place]) -following with cardiology  - Pneumococcal polysaccharide vaccine 23-valent  greater than or equal to 2yo subcutaneous/IM  7. Need for influenza vaccination - Flu Vaccine QUAD High Dose(Fluad)  8. Need for 23-polyvalent pneumococcal polysaccharide vaccine - Pneumococcal polysaccharide vaccine 23-valent greater than or equal to 2yo subcutaneous/IM  Next appt: 6 months, sooner if needed  Izmael Duross K. [place], [facility] [phone]

## 2019-07-29 ENCOUNTER — Other Ambulatory Visit: Payer: Self-pay

## 2019-07-29 MED ORDER — SPIRONOLACTONE 25 MG PO TABS: 25.0000 mg | ORAL_TABLET | Freq: Every day | ORAL | 5 refills | Status: DC

## 2019-08-02 ENCOUNTER — Other Ambulatory Visit: Payer: Self-pay | Admitting: Pulmonary Disease

## 2019-08-02 DIAGNOSIS — C3490 Malignant neoplasm of unspecified part of unspecified bronchus or lung: Secondary | ICD-10-CM

## 2019-09-30 ENCOUNTER — Ambulatory Visit: Payer: Medicare PPO | Admitting: Internal Medicine

## 2019-09-30 ENCOUNTER — Other Ambulatory Visit: Payer: Self-pay

## 2019-09-30 ENCOUNTER — Encounter: Payer: Self-pay | Admitting: Internal Medicine

## 2019-09-30 VITALS — BP 130/50 | HR 46 | Temp 98.1°F | Ht 65.0 in | Wt 162.0 lb

## 2019-09-30 DIAGNOSIS — I351 Nonrheumatic aortic (valve) insufficiency: Secondary | ICD-10-CM

## 2019-09-30 DIAGNOSIS — I352 Nonrheumatic aortic (valve) stenosis with insufficiency: Secondary | ICD-10-CM | POA: Diagnosis not present

## 2019-09-30 DIAGNOSIS — I4891 Unspecified atrial fibrillation: Secondary | ICD-10-CM

## 2019-09-30 DIAGNOSIS — E782 Mixed hyperlipidemia: Secondary | ICD-10-CM

## 2019-09-30 DIAGNOSIS — I5032 Chronic diastolic (congestive) heart failure: Secondary | ICD-10-CM

## 2019-09-30 DIAGNOSIS — R079 Chest pain, unspecified: Secondary | ICD-10-CM

## 2019-09-30 DIAGNOSIS — Z7901 Long term (current) use of anticoagulants: Secondary | ICD-10-CM

## 2019-09-30 NOTE — Progress Notes (Signed)
Cardiology Office Note:    Date:  09/30/2019   ID:  Valerie [name], DOB 09/11/1942, MRN [id number]  PCP:  Lauree Chandler, NP  Cardiologist:  [name], MD  Electrophysiologist:  None   Referring MD: Lauree Chandler, NP   Chief Complaint: aortic regurgitation.  History of Present Illness:    Valerie [name] is a 78 y.o. female with a hx of atrial fibrillation with slow ventricular response, HLD, CHF with multiple exacerbation with ongoing NYHA class II symptoms, recent echo with EF of 50-55%, hx of lung cancer in remission (s/p radiation), and dementia.  And in person interpreter was available for the consultation, however the patient and her daughter preferred to communicate through her daughter who serves as her interpreter.  We participated in shared decision making and determined that this would be okay for this visit.  I last met [name] in July 2020 at which point we discussed concerns of heart failure and chest discomfort.  We performed a stress test showed evidence of infarct and peri-infarct ischemia, and right and left heart cath was pursued.  Coronary angiography revealed inability to visualize the right coronary artery which corresponds with the defect seen on cath.  In addition no coronary artery disease was seen in the left system.  Echocardiogram demonstrated an EF 50 to 55%, elevated LVEDP severe biatrial enlargement, and severe aortic valve regurgitation with moderate to severe aortic valve stenosis.  Aortic valve mean gradient 26 mmHg.  We reviewed these findings again today.  She continues to be concerned about shortness of breath with exertion and chest discomfort.  However when we discuss it further, she feels that she is doing fine and is not interested in taking additional medications.  We discussed the role of antianginals, and adequate blood pressure therapy in the setting of severe aortic regurgitation.  We also discussed that her heart size and ejection fraction  do not warrant aortic valve replacement currently but we should watch this very closely.  She continues on Xarelto for anticoagulation for A. fib without bleeding.  She continues on Lasix and spironolactone for HFpEF.  The patient denies palpitations, PND, orthopnea, or leg swelling. Denies syncope or presyncope. Denies dizziness or lightheadedness. Denies snoring and has not been evaluated for sleep apnea.  Past Medical History:  Diagnosis Date   Alzheimer disease [facility])    Per new patient packet- [place]    Dementia ([place])    Per [facility] patient packet- [facility]    Low oxygen saturation    Per new patient packet- [name]    Lung cancer ([place])    Per new patient packet- [place]    Pneumonia    Per new patient packet- [place]     Past Surgical History:  Procedure Laterality Date   PARTIAL HYSTERECTOMY     Still with ovaries, Per new patient packet- [facility]    RIGHT/LEFT HEART CATH AND CORONARY ANGIOGRAPHY N/A 06/21/2019   Procedure: RIGHT/LEFT HEART CATH AND CORONARY ANGIOGRAPHY;  Surgeon: [name], [name], MD;  Location: [place] [facility];  Service: Cardiovascular;  Laterality: N/A;    Current Medications: Current Meds  Medication Sig   atorvastatin (LIPITOR) 20 MG tablet Take 20 mg by mouth daily.   Cholecalciferol (VITAMIN D3 SUPER STRENGTH) 50 MCG (2000 UT) TABS Take 2,000 Units by mouth daily at 8 pm.    furosemide (LASIX) 20 MG tablet Take 20 mg by mouth daily.   OXYGEN Inhale 3 L into the lungs at bedtime.  rivaroxaban (XARELTO) 20 MG TABS tablet Take 1 tablet (20 mg total) by mouth daily with supper.   spironolactone (ALDACTONE) 25 MG tablet Take 1 tablet (25 mg total) by mouth daily.   vitamin B-12 (CYANOCOBALAMIN) 1000 MCG tablet Take 1,000 mcg by mouth daily.   vitamin C (ASCORBIC ACID) 500 MG tablet Take 500 mg by mouth daily.   Vitamins-Lipotropics (LIPO-FLAVONOID PLUS PO) Take 1,000 mg by mouth daily.     Allergies:   Patient has no known allergies.   Social History    Socioeconomic History   Marital status: Married    Spouse name: Not on file   Number of children: Not on file   Years of education: Not on file   Highest education level: Not on file  Occupational History   Not on file  Tobacco Use   Smoking status: Never Smoker   Smokeless tobacco: Never Used  Substance and Sexual Activity   Alcohol use: Never   Drug use: Never   Sexual activity: Not on file  Other Topics Concern   Not on file  Social History Narrative   Per [place] new patient packet abstracted 03/28/2019      Diet: Vegetarian       Caffeine: None      Married, if yes what year: Yes, 1959      Do you live in a house, apartment, assisted living, condo, trailer, ect: House, 2 stories, 5 persons      Pets: Dog      Highest level of education: 6th grade       Current/Past profession: N/A      Exercise:No         Living Will: No   DNR: No   POA/HPOA: No      Functional Status:   Do you have difficulty bathing or dressing yourself? No   Do you have difficulty preparing food or eating? Yes   Do you have difficulty managing your medications? Yes   Do you have difficulty managing your finances? Yes   Do you have difficulty affording your medications? Nes   Social Determinants of Health   Financial Resource Strain:    Difficulty of Paying Living Expenses: Not on file  Food Insecurity:    Worried About Charity fundraiser in the Last Year: Not on file   [employer] of Food in the Last Year: Not on file  Transportation Needs:    Lack of Transportation (Medical): Not on file   Lack of Transportation (Non-Medical): Not on file  Physical Activity:    Days of Exercise per Week: Not on file   Minutes of Exercise per Session: Not on file  Stress:    Feeling of Stress : Not on file  Social Connections:    Frequency of Communication with Friends and Family: Not on file   Frequency of Social Gatherings with Friends and Family: Not on file   Attends  Religious Services: Not on file   Active Member of Clubs or Organizations: Not on file   Attends Archivist Meetings: Not on file   Marital Status: Not on file     Family History: The patient's family history includes Alzheimer's disease in her father and mother.  ROS:   Please see the history of present illness.    All other systems reviewed and are negative.  EKGs/Labs/Other Studies Reviewed:    The following studies were reviewed today:  EKG:  afib SVR, LAFB, nonspecific t wave abnormality  Recent Labs: 04/01/2019: ALT 8; TSH 2.38 06/17/2019: BUN 12; Creatinine, Ser 0.70; Platelets 140 06/21/2019: Hemoglobin 10.9; Hemoglobin 10.2; Potassium 4.2; Potassium 4.2; Sodium 140; Sodium 135  Recent Lipid Panel    Component Value Date/Time   CHOL 139 06/25/2019 0934   TRIG 49 06/25/2019 0934   HDL 70 06/25/2019 0934   CHOLHDL 2.0 06/25/2019 0934   LDLCALC 55 06/25/2019 0934    Physical Exam:    VS:  BP (!) 130/50 (BP Location: Left Arm, Patient Position: Sitting, Cuff Size: Normal)    Pulse (!) 46    Temp 98.1 F (36.7 C)    Ht '5\' 5"'  (1.651 m)    Wt 162 lb (73.5 kg)    BMI 26.96 kg/m     Wt Readings from Last 5 Encounters:  09/30/19 162 lb (73.5 kg)  07/15/19 161 lb (73 kg)  07/04/19 162 lb (73.5 kg)  06/21/19 160 lb (72.6 kg)  06/12/19 161 lb (73 kg)     Constitutional: No acute distress Eyes: sclera non-icteric, normal conjunctiva and lids ENMT: normal dentition, moist mucous membranes Cardiovascular: Irregular rhythm, bradycardic rate, 3 out of 6 hello diastolic murmur heard along the left sternal border, 3 out of 6 systolic ejection murmur mid to late peaking.. S1 and S2 normal. No jugular venous distention.  Respiratory: clear to auscultation bilaterally GI : normal bowel sounds, soft and nontender. No distention.   MSK: extremities warm, well perfused. No edema.  NEURO: grossly nonfocal exam, moves all extremities. PSYCH: alert and oriented x 3, normal  mood and affect.   ASSESSMENT:    1. Severe aortic regurgitation   2. Nonrheumatic aortic insufficiency with aortic stenosis   3. Atrial fibrillation, unspecified type ([place])   4. Chronic diastolic heart failure (HCC)   5. Chest pain, unspecified type   6. Chronic anticoagulation   7. Mixed hyperlipidemia    PLAN:    Chest pain-the patient does seem to have an inferior perfusion defect with possible occlusion of the RCA which is not visualized on coronary angiography that was recently performed.  She is on atorvastatin, and Xarelto.  She has slow heart rate therefore beta-blocker is contraindicated at this time, and blood pressure is reasonably controlled with Lasix and spironolactone.  We will observe this and have participated in shared decision making determining that she will call us if she has any escalation of symptoms or request for further evaluation.  Atrial fibrillation with slow ventricular response-no rate control required, severe biatrial enlargement.  Continue Xarelto.  No bleeding has occurred.  Chronic diastolic heart failure-she has an elevated LVEDP and this is likely complicated by significant volume of aortic valve regurgitation.  She appears to have NYHA class II symptoms most days and is well maintained on Lasix and spironolactone.  Continue at current doses.  Mixed aortic valve disease-we should certainly consider TEE to best quantitate her mixed aortic valve disease in the future.  The patient has mild dementia and also is not eager for aggressive therapies per her own report.  She prefers not to take medications.  We will discuss this at subsequent visits and we should obtain an echocardiogram likely at her next visit.  I have explained the pathophysiology to the patient and her daughter and they understand that while the valve is not a surgical valve at this point, we should continue to follow it, particularly following LV size and function for aortic  regurgitation.  TIME SPENT WITH PATIENT: 25 minutes of direct patient care.  More than 50% of that time was spent on coordination of care and counseling regarding natural history of aortic valve disease, management of chest pain and heart failure.  Cherlynn Kaiser, MD [place]   [facility]   Medication Adjustments/Labs and Tests Ordered: Current medicines are reviewed at length with the patient today.  Concerns regarding medicines are outlined above.  Orders Placed This Encounter  Procedures   EKG 12-Lead   No orders of the defined types were placed in this encounter.   Patient Instructions  Medication Instructions:  Your physician recommends that you continue on your current medications as directed. Please refer to the Current Medication list given to you today.  *If you need a refill on your cardiac medications before your next appointment, please call your pharmacy*  Lab Work: NONE   Testing/Procedures: NONE  Follow-Up: At [facility], you and your health needs are our priority.  As part of our continuing mission to provide you with exceptional heart care, we have created designated Provider Care Teams.  These Care Teams include your primary Cardiologist (physician) and Advanced Practice Providers (APPs -  Physician Assistants and Nurse Practitioners) who all work together to provide you with the care you need, when you need it.  Your next appointment:   3 month(s)  The format for your next appointment:   In Person  Provider:   You may see [name], MD or one of the following Advanced Practice Providers on your designated Care Team:    Rosaria Ferries, PA-C  Jory Sims, DNP, ANP  Cadence Kathlen Mody, NP

## 2019-09-30 NOTE — Patient Instructions (Signed)
Medication Instructions:  Your physician recommends that you continue on your current medications as directed. Please refer to the Current Medication list given to you today.  *If you need a refill on your cardiac medications before your next appointment, please call your pharmacy*  Lab Work: NONE   Testing/Procedures: NONE  Follow-Up: At [facility], you and your health needs are our priority.  As part of our continuing mission to provide you with exceptional heart care, we have created designated Provider Care Teams.  These Care Teams include your primary Cardiologist (physician) and Advanced Practice Providers (APPs -  Physician Assistants and Nurse Practitioners) who all work together to provide you with the care you need, when you need it.  Your next appointment:   3 month(s)  The format for your next appointment:   In Person  Provider:   You may see [name], MD or one of the following Advanced Practice Providers on your designated Care Team:    Rosaria Ferries, PA-C  Jory Sims, DNP, ANP  Cadence Kathlen Mody, NP

## 2019-10-03 ENCOUNTER — Other Ambulatory Visit (INDEPENDENT_AMBULATORY_CARE_PROVIDER_SITE_OTHER): Payer: Medicare PPO

## 2019-10-03 DIAGNOSIS — I352 Nonrheumatic aortic (valve) stenosis with insufficiency: Secondary | ICD-10-CM | POA: Diagnosis not present

## 2019-10-03 DIAGNOSIS — Z9889 Other specified postprocedural states: Secondary | ICD-10-CM

## 2019-10-03 DIAGNOSIS — I350 Nonrheumatic aortic (valve) stenosis: Secondary | ICD-10-CM

## 2019-10-03 DIAGNOSIS — I4891 Unspecified atrial fibrillation: Secondary | ICD-10-CM | POA: Diagnosis not present

## 2019-10-03 DIAGNOSIS — I5032 Chronic diastolic (congestive) heart failure: Secondary | ICD-10-CM | POA: Diagnosis not present

## 2019-10-03 DIAGNOSIS — R079 Chest pain, unspecified: Secondary | ICD-10-CM

## 2019-10-03 DIAGNOSIS — E781 Pure hyperglyceridemia: Secondary | ICD-10-CM

## 2019-11-12 ENCOUNTER — Other Ambulatory Visit: Payer: Self-pay | Admitting: *Deleted

## 2019-11-12 MED ORDER — ATORVASTATIN CALCIUM 20 MG PO TABS: 20.0000 mg | ORAL_TABLET | Freq: Every day | ORAL | 1 refills | Status: DC

## 2019-11-12 MED ORDER — FUROSEMIDE 20 MG PO TABS: 20.0000 mg | ORAL_TABLET | Freq: Every day | ORAL | 1 refills | Status: DC

## 2019-11-12 NOTE — Telephone Encounter (Signed)
Patient daughter requested refill.

## 2019-11-25 DIAGNOSIS — J9611 Chronic respiratory failure with hypoxia: Secondary | ICD-10-CM | POA: Diagnosis not present

## 2019-11-25 DIAGNOSIS — C3490 Malignant neoplasm of unspecified part of unspecified bronchus or lung: Secondary | ICD-10-CM | POA: Diagnosis not present

## 2019-11-26 ENCOUNTER — Other Ambulatory Visit: Payer: Self-pay | Admitting: Pulmonary Disease

## 2019-11-26 DIAGNOSIS — C3490 Malignant neoplasm of unspecified part of unspecified bronchus or lung: Secondary | ICD-10-CM

## 2019-11-26 DIAGNOSIS — R911 Solitary pulmonary nodule: Secondary | ICD-10-CM | POA: Diagnosis not present

## 2019-11-26 DIAGNOSIS — J9611 Chronic respiratory failure with hypoxia: Secondary | ICD-10-CM

## 2019-11-29 DIAGNOSIS — C3490 Malignant neoplasm of unspecified part of unspecified bronchus or lung: Secondary | ICD-10-CM | POA: Diagnosis not present

## 2019-11-29 DIAGNOSIS — J9611 Chronic respiratory failure with hypoxia: Secondary | ICD-10-CM | POA: Diagnosis not present

## 2019-12-03 DIAGNOSIS — J9611 Chronic respiratory failure with hypoxia: Secondary | ICD-10-CM | POA: Diagnosis not present

## 2019-12-03 DIAGNOSIS — C3490 Malignant neoplasm of unspecified part of unspecified bronchus or lung: Secondary | ICD-10-CM | POA: Diagnosis not present

## 2019-12-05 DIAGNOSIS — J9611 Chronic respiratory failure with hypoxia: Secondary | ICD-10-CM | POA: Diagnosis not present

## 2019-12-05 DIAGNOSIS — C3490 Malignant neoplasm of unspecified part of unspecified bronchus or lung: Secondary | ICD-10-CM | POA: Diagnosis not present

## 2019-12-09 DIAGNOSIS — J9611 Chronic respiratory failure with hypoxia: Secondary | ICD-10-CM | POA: Diagnosis not present

## 2019-12-09 DIAGNOSIS — C3490 Malignant neoplasm of unspecified part of unspecified bronchus or lung: Secondary | ICD-10-CM | POA: Diagnosis not present

## 2019-12-10 DIAGNOSIS — C3490 Malignant neoplasm of unspecified part of unspecified bronchus or lung: Secondary | ICD-10-CM | POA: Diagnosis not present

## 2019-12-10 DIAGNOSIS — J9611 Chronic respiratory failure with hypoxia: Secondary | ICD-10-CM | POA: Diagnosis not present

## 2019-12-11 DIAGNOSIS — C3490 Malignant neoplasm of unspecified part of unspecified bronchus or lung: Secondary | ICD-10-CM | POA: Diagnosis not present

## 2019-12-11 DIAGNOSIS — J9611 Chronic respiratory failure with hypoxia: Secondary | ICD-10-CM | POA: Diagnosis not present

## 2019-12-12 ENCOUNTER — Ambulatory Visit: Payer: Medicare HMO

## 2019-12-17 DIAGNOSIS — J9611 Chronic respiratory failure with hypoxia: Secondary | ICD-10-CM | POA: Diagnosis not present

## 2019-12-17 DIAGNOSIS — C3490 Malignant neoplasm of unspecified part of unspecified bronchus or lung: Secondary | ICD-10-CM | POA: Diagnosis not present

## 2019-12-19 DIAGNOSIS — C3490 Malignant neoplasm of unspecified part of unspecified bronchus or lung: Secondary | ICD-10-CM | POA: Diagnosis not present

## 2019-12-19 DIAGNOSIS — J9611 Chronic respiratory failure with hypoxia: Secondary | ICD-10-CM | POA: Diagnosis not present

## 2019-12-23 DIAGNOSIS — C3490 Malignant neoplasm of unspecified part of unspecified bronchus or lung: Secondary | ICD-10-CM | POA: Diagnosis not present

## 2019-12-23 DIAGNOSIS — J9611 Chronic respiratory failure with hypoxia: Secondary | ICD-10-CM | POA: Diagnosis not present

## 2019-12-24 ENCOUNTER — Ambulatory Visit (INDEPENDENT_AMBULATORY_CARE_PROVIDER_SITE_OTHER): Payer: Medicare HMO | Admitting: Primary Care

## 2019-12-24 ENCOUNTER — Encounter (INDEPENDENT_AMBULATORY_CARE_PROVIDER_SITE_OTHER): Payer: Self-pay | Admitting: Primary Care

## 2019-12-24 ENCOUNTER — Other Ambulatory Visit: Payer: Self-pay

## 2019-12-24 DIAGNOSIS — Z0000 Encounter for general adult medical examination without abnormal findings: Secondary | ICD-10-CM

## 2019-12-24 DIAGNOSIS — R0789 Other chest pain: Secondary | ICD-10-CM | POA: Diagnosis not present

## 2019-12-24 NOTE — Progress Notes (Signed)
Virtual Visit via Telephone Note  I connected with Valerie [name] on 12/24/19 at  1:50 PM EST by telephone and verified that I am speaking with the correct person using two identifiers.   I discussed the limitations, risks, security and privacy concerns of performing an evaluation and management service by telephone and the availability of in person appointments. I also discussed with the patient that there may be a patient responsible charge related to this service. The patient expressed understanding and agreed to proceed.   History of Present Illness: Valerie [name] is a 79 year old Hispanic female being seen to establish care no complaints but aging normal.Due to her insurance she is having to change PCP.  She is followed by cardiology for Nonrheumatic aortic insufficiency with aortic ,Chronic diastolic heart failure and atrial fibrillation, unspecified type. Followed by pulmonology for Malignant neoplasm of lung, unspecified laterality, unspecified part of lung and   Chronic hypoxemic respiratory failure Past Medical History:  Diagnosis Date  . Alzheimer disease [facility])    Per [facility] packet- [facility]   . Atrial fibrillation ([place]) 2006   Per [facility] received from [facility]  . Dementia [facility])    Per new patient packet- [facility]   . Low oxygen saturation    Per new patient packet- [facility]   . Lung cancer [facility])    Per new patient packet- [facility]   . On home oxygen therapy    Per [facility] from [facility]  . Pneumonia    Per new patient packet- [facility]    Observations/Objective:  Current Outpatient Medications on File Prior to Visit  Medication Sig Dispense Refill  . atorvastatin (LIPITOR) 20 MG tablet Take 1 tablet (20 mg total) by mouth daily. 90 tablet 1  . Cholecalciferol (VITAMIN D3 SUPER STRENGTH) 50 MCG (2000 UT) TABS Take 2,000 Units by mouth daily at 8 pm.     . furosemide (LASIX) 20 MG tablet Take 1 tablet (20 mg total) by mouth daily. 90 tablet 1  . OXYGEN Inhale 3 L into the lungs at  bedtime.     . rivaroxaban (XARELTO) 20 MG TABS tablet Take 1 tablet (20 mg total) by mouth daily with supper. 90 tablet 1  . spironolactone (ALDACTONE) 25 MG tablet Take 1 tablet (25 mg total) by mouth daily. 30 tablet 5  . vitamin B-12 (CYANOCOBALAMIN) 1000 MCG tablet Take 1,000 mcg by mouth daily.    . vitamin C (ASCORBIC ACID) 500 MG tablet Take 500 mg by mouth daily.    . Vitamins-Lipotropics (LIPO-FLAVONOID PLUS PO) Take 1,000 mg by mouth daily.     No current facility-administered medications on file prior to visit.   Assessment and Plan: Johnnae was seen today for new patient (initial visit).  Diagnoses and all orders for this visit:  Encounter for medical examination to establish care Juluis Mire, NP-C will be your  (PCP) she is mastered prepared . She is skilled to diagnosed and treat illness. Also able to answer health concern as well as continuing care of varied medical conditions, not limited by cause, organ system, or diagnosis.    Follow Up Instructions:    I discussed the assessment and treatment plan with the patient. The patient was provided an opportunity to ask questions and all were answered. The patient agreed with the plan and demonstrated an understanding of the instructions.   The patient was advised to call back or seek an in-person evaluation if the symptoms worsen or if the condition fails to  improve as anticipated.  I provided12 minutes of non-face-to-face time during this encounter. Reviewing past medical history, labs and imaging    [name], NP

## 2019-12-24 NOTE — Progress Notes (Signed)
Pt called with [place] interpreter she did not want to use their services. Declined interpreter and gave permission for daughter(Jerlean jr) to interpreter phone visit Pt reports pinching/stabbing feeling on left side by heart Pt is followed by cardiology

## 2019-12-26 ENCOUNTER — Telehealth: Payer: Self-pay | Admitting: Internal Medicine

## 2019-12-26 NOTE — Telephone Encounter (Signed)
  Daughter Tifini is calling because she will need to come with the patient to her appointment on 12/31/19 with Dr Margaretann Loveless. She states the patient has dementia.

## 2019-12-26 NOTE — Telephone Encounter (Signed)
Spoke with pt dtr, okay given for her to come to appointment.

## 2019-12-27 ENCOUNTER — Other Ambulatory Visit: Payer: Self-pay

## 2019-12-27 ENCOUNTER — Ambulatory Visit
Admission: RE | Admit: 2019-12-27 | Discharge: 2019-12-27 | Disposition: A | Discharge status: Home / Self Care | Payer: Medicare HMO | Source: Ambulatory Visit | Attending: Pulmonary Disease | Admitting: Pulmonary Disease

## 2019-12-27 DIAGNOSIS — J9611 Chronic respiratory failure with hypoxia: Secondary | ICD-10-CM

## 2019-12-27 DIAGNOSIS — R911 Solitary pulmonary nodule: Secondary | ICD-10-CM | POA: Diagnosis not present

## 2019-12-27 DIAGNOSIS — R918 Other nonspecific abnormal finding of lung field: Secondary | ICD-10-CM | POA: Diagnosis not present

## 2019-12-27 DIAGNOSIS — C3490 Malignant neoplasm of unspecified part of unspecified bronchus or lung: Secondary | ICD-10-CM | POA: Diagnosis not present

## 2019-12-27 LAB — POCT I-STAT CREATININE: Creatinine, Ser: 0.8 mg/dL (ref 0.44–1.00)

## 2019-12-27 MED ORDER — IOHEXOL 300 MG/ML  SOLN
75.0000 mL | Freq: Once | INTRAMUSCULAR | Status: AC | PRN
  Administered 2019-12-27: 75 mL via INTRAVENOUS

## 2019-12-31 ENCOUNTER — Encounter: Payer: Self-pay | Admitting: Internal Medicine

## 2019-12-31 ENCOUNTER — Ambulatory Visit: Payer: Medicare HMO | Admitting: Internal Medicine

## 2019-12-31 ENCOUNTER — Other Ambulatory Visit: Payer: Self-pay

## 2019-12-31 VITALS — BP 130/84 | HR 41 | Ht 65.0 in | Wt 161.8 lb

## 2019-12-31 DIAGNOSIS — R079 Chest pain, unspecified: Secondary | ICD-10-CM

## 2019-12-31 DIAGNOSIS — I5032 Chronic diastolic (congestive) heart failure: Secondary | ICD-10-CM

## 2019-12-31 DIAGNOSIS — I351 Nonrheumatic aortic (valve) insufficiency: Secondary | ICD-10-CM | POA: Diagnosis not present

## 2019-12-31 DIAGNOSIS — G4733 Obstructive sleep apnea (adult) (pediatric): Secondary | ICD-10-CM | POA: Diagnosis not present

## 2019-12-31 DIAGNOSIS — Z7901 Long term (current) use of anticoagulants: Secondary | ICD-10-CM | POA: Diagnosis not present

## 2019-12-31 DIAGNOSIS — I4891 Unspecified atrial fibrillation: Secondary | ICD-10-CM

## 2019-12-31 DIAGNOSIS — E781 Pure hyperglyceridemia: Secondary | ICD-10-CM | POA: Diagnosis not present

## 2019-12-31 NOTE — Progress Notes (Signed)
Cardiology Office Note:    Date:  12/31/2019   ID:  Valerie [name], DOB [birth year], MRN [id number]  PCP:  [name], NP  Cardiologist:  [name], MD  Electrophysiologist:  None   Referring MD: Lauree Chandler, NP   Chief Complaint: f/u aortic regurgitation and chest pain  History of Present Illness:    Valerie [name] is a 79 y.o. female with a history of atrial fibrillation with slow ventricular response, HLD, CHF with multiple exacerbation with ongoing NYHA class II symptoms, recent echo with EF of 50-55%, hx of lung cancer in remission(s/p radiation), and dementia.  Her daughter prefers to serve as her interpreter, official interpreter services have been offered and deferred.   She continues to describe atypical chest pain. 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 We also discussed that this could be secondary to high LVEDP with aortic valve regurgitation and small vessel disease. She feels she gets chest pain 1 x a month. We discussed antianginal therapy and management of blood pressure. She is quite clear that she would not like to add medications to her list but will do what is necessary for longevity. We are also limited by bradycardia in afib. We discussed indications for pacing, she is again clear in her choice that she would not like to pursue pacemaker implantation.   Has sleep apnea and doesn't use cpap because she forgets. Uses O2 overnight.   Past Medical History:  Diagnosis Date  . Alzheimer disease [facility])    Per new patient packet- [facility]   . Atrial fibrillation ([place]) 2006   Per Records received from [facility]  . Dementia [facility])    Per new patient packet- [facility]   . Low oxygen saturation    Per new patient packet- [facility]   . Lung cancer [facility])    Per new patient packet- [facility]   . On home oxygen therapy    Per Records received  from [facility]  . Pneumonia    Per new patient packet- [facility]     Past Surgical History:  Procedure Laterality Date  . PARTIAL HYSTERECTOMY     Still with ovaries, Per new patient packet- [facility]   . RIGHT/LEFT HEART CATH AND CORONARY ANGIOGRAPHY N/A 06/21/2019   Procedure: RIGHT/LEFT HEART CATH AND CORONARY ANGIOGRAPHY;  Surgeon: [name], [name], MD;  Location: [facility];  Service: Cardiovascular;  Laterality: N/A;    Current Medications: Current Meds  Medication Sig  . atorvastatin (LIPITOR) 20 MG tablet Take 1 tablet (20 mg total) by mouth daily.  . Cholecalciferol (VITAMIN D3 SUPER STRENGTH) 50 MCG (2000 UT) TABS Take 2,000 Units by mouth daily at 8 pm.   . furosemide (LASIX) 20 MG tablet Take 1 tablet (20 mg total) by mouth daily.  . OXYGEN Inhale 3 L into the lungs at bedtime.   . rivaroxaban (XARELTO) 20 MG TABS tablet Take 1 tablet (20 mg total) by mouth daily with supper.  Marland Kitchen spironolactone (ALDACTONE) 25 MG tablet Take 1 tablet (25 mg total) by mouth daily.  . vitamin B-12 (CYANOCOBALAMIN) 1000 MCG tablet Take 1,000 mcg by mouth daily.  . vitamin C (ASCORBIC ACID) 500 MG tablet Take 500 mg by mouth daily.  . Vitamins-Lipotropics (LIPO-FLAVONOID PLUS PO) Take 1,000 mg by mouth daily.     Allergies:   Patient has no  known allergies.   Social History   Socioeconomic History  . Marital status: Married    Spouse name: Not on file  . Number of children: Not on file  . Years of education: Not on file  . Highest education level: Not on file  Occupational History  . Not on file  Tobacco Use  . Smoking status: Never Smoker  . Smokeless tobacco: Never Used  Substance and Sexual Activity  . Alcohol use: Never  . Drug use: Never  . Sexual activity: Not on file  Other Topics Concern  . Not on file  Social History Narrative   Per [facility] new patient packet abstracted 03/28/2019      Diet: Vegetarian       Caffeine: None      Married, if yes what year: Yes, 1959      Do  you live in a house, apartment, assisted living, condo, trailer, ect: House, 2 stories, 5 persons      Pets: Dog      Highest level of education: 6th grade       Current/Past profession: N/A      Exercise:No         Living Will: No   DNR: No   POA/HPOA: No      Functional Status:   Do you have difficulty bathing or dressing yourself? No   Do you have difficulty preparing food or eating? Yes   Do you have difficulty managing your medications? Yes   Do you have difficulty managing your finances? Yes   Do you have difficulty affording your medications? Nes   Social Determinants of Health   Financial Resource Strain:   . Difficulty of Paying Living Expenses:   Food Insecurity:   . Worried About Charity fundraiser in the Last Year:   . Arboriculturist in the Last Year:   Transportation Needs:   . Film/video editor (Medical):   Marland Kitchen Lack of Transportation (Non-Medical):   Physical Activity:   . Days of Exercise per Week:   . Minutes of Exercise per Session:   Stress:   . Feeling of Stress :   Social Connections:   . Frequency of Communication with Friends and Family:   . Frequency of Social Gatherings with Friends and Family:   . Attends Religious Services:   . Active Member of Clubs or Organizations:   . Attends Archivist Meetings:   Marland Kitchen Marital Status:      Family History: The patient's family history includes Alzheimer's disease in her father and mother.  ROS:   Please see the history of present illness.    All other systems reviewed and are negative.  EKGs/Labs/Other Studies Reviewed:    The following studies were reviewed today:  EKG:  Not performed today.   Recent Labs: 04/01/2019: ALT 8; TSH 2.38 06/17/2019: BUN 12; Platelets 140 06/21/2019: Hemoglobin 10.9; Hemoglobin 10.2; Potassium 4.2; Potassium 4.2; Sodium 140; Sodium 135 12/27/2019: Creatinine, Ser 0.80  Recent Lipid Panel    Component Value Date/Time   CHOL 139 06/25/2019 0934   TRIG  49 06/25/2019 0934   HDL 70 06/25/2019 0934   CHOLHDL 2.0 06/25/2019 0934   LDLCALC 55 06/25/2019 0934    Physical Exam:    VS:  BP 130/84   Pulse (!) 41   Ht 5\' 5"  (1.651 m)   Wt 161 lb 12.8 oz (73.4 kg)   SpO2 91%   BMI 26.92 kg/m  Wt Readings from Last 5 Encounters:  12/31/19 161 lb 12.8 oz (73.4 kg)  09/30/19 162 lb (73.5 kg)  07/15/19 161 lb (73 kg)  07/04/19 162 lb (73.5 kg)  06/21/19 160 lb (72.6 kg)     Constitutional: No acute distress Eyes: sclera non-icteric, normal conjunctiva and lids ENMT: normal dentition, moist mucous membranes Cardiovascular: irregular rhythm with bradycardia. 3/6 SEM, 3/6 holodiastolic murmur at the LSB. No JVD.  Respiratory: clear to auscultation bilaterally GI : normal bowel sounds, soft and nontender. No distention.   MSK: extremities warm, well perfused. Diffuse bilateral edema, mild NEURO: grossly nonfocal exam, moves all extremities. PSYCH: alert and oriented x 3, normal mood and affect.   ASSESSMENT:    1. Severe aortic regurgitation   2. Atrial fibrillation, unspecified type ([place])   3. Chest pain of uncertain etiology   4. Chronic diastolic heart failure ([place])   5. Pure hypertriglyceridemia   6. Chronic anticoagulation   7. OSA (obstructive sleep apnea)    PLAN:    Mixed aortic valve disease- Plan: ECHOCARDIOGRAM COMPLETE We will repeat an echo to assess if AS has progressed and AR is impacting LV, which may help guide therapy. We discussed TAVR and natural history of AS, and she is inclined to defer procedures, however I mentioned we should collect the data and then discuss so we can develop a plan.   Atrial fibrillation, SVR - no syncope. Declines EP evaluation given that she would not want a pacemaker.   Chest pain of uncertain etiology - likely 2/2 to CAD of RCA and aortic valve regurgitation. Discussed cautious use of nitrates, we can evaluate degree of AS first before altering therapy. She would not like additional  medications if at all possible. Chest pain is infrequent and she is overall not bothered by symptoms.   Chronic diastolic heart failure (HCC) - continue lasix  Pure hypertriglyceridemia - continue atorvastatin   Chronic anticoagulation - no bleeding events, continue Xarelto for afib.   OSA (obstructive sleep apnea) - does not remember to use CPAP, uses O2. Can continue. Encouraged cpap compliance if possible.   Total time of encounter: 40 minutes total time of encounter, including 30 minutes spent in face-to-face patient care. This time includes coordination of care and counseling regarding above mentioned problem list. Remainder of non-face-to-face time involved reviewing chart documents/testing relevant to the patient encounter and documentation in the medical record. I have independently reviewed documentation from referring provider.   Cherlynn Kaiser, MD [place]  [facility]    Medication Adjustments/Labs and Tests Ordered: Current medicines are reviewed at length with the patient today.  Concerns regarding medicines are outlined above.  Orders Placed This Encounter  Procedures  . ECHOCARDIOGRAM COMPLETE   No orders of the defined types were placed in this encounter.   Patient Instructions  Medication Instructions:  Your physician recommends that you continue on your current medications as directed. Please refer to the Current Medication list given to you today.  *If you need a refill on your cardiac medications before your next appointment, please call your pharmacy*  Lab Work: NONE   Testing/Procedures: Your physician has requested that you have an echocardiogram. Echocardiography is a painless test that uses sound waves to create images of your heart. It provides your doctor with information about the size and shape of your heart and how well your heart's chambers and valves are working. This procedure takes approximately one hour. There are no restrictions for  this procedure. [facility]  [place]: At [facility], you and your health needs are our priority.  As part of our continuing mission to provide you with exceptional heart care, we have created designated Provider Care Teams.  These Care Teams include your primary Cardiologist (physician) and Advanced Practice Providers (APPs -  Physician Assistants and Nurse Practitioners) who all work together to provide you with the care you need, when you need it.  We recommend signing up for the patient portal called "MyChart".  Sign up information is provided on this After Visit Summary.  MyChart is used to connect with patients for Virtual Visits (Telemedicine).  Patients are able to view lab/test results, encounter notes, upcoming appointments, etc.  Non-urgent messages can be sent to your provider as well.   To learn more about what you can do with MyChart, go to [email or web address].    Your next appointment:   2-3 week(s)  The format for your next appointment:   Virtual Visit   Provider:   You may see [name], MD or one of the following Advanced Practice Providers on your designated Care Team:    Rosaria Ferries, PA-C  Jory Sims, DNP, ANP  Cadence Kathlen Mody, NP   Other Instructions   Echocardiogram An echocardiogram is a procedure that uses painless sound waves (ultrasound) to produce an image of the heart. Images from an echocardiogram can provide important information about:  Signs of coronary artery disease (CAD).  Aneurysm detection. An aneurysm is a weak or damaged part of an artery wall that bulges out from the normal force of blood pumping through the body.  Heart size and shape. Changes in the size or shape of the heart can be associated with certain conditions, including heart failure, aneurysm, and CAD.  Heart muscle function.  Heart valve function.  Signs of a past heart attack.  Fluid buildup around the  heart.  Thickening of the heart muscle.  A tumor or infectious growth around the heart valves. Tell a health care provider about:  Any allergies you have.  All medicines you are taking, including vitamins, herbs, eye drops, creams, and over-the-counter medicines.  Any blood disorders you have.  Any surgeries you have had.  Any medical conditions you have.  Whether you are pregnant or may be pregnant. What are the risks? Generally, this is a safe procedure. However, problems may occur, including:  Allergic reaction to dye (contrast) that may be used during the procedure. What happens before the procedure? No specific preparation is needed. You may eat and drink normally. What happens during the procedure?   An IV tube may be inserted into one of your veins.  You may receive contrast through this tube. A contrast is an injection that improves the quality of the pictures from your heart.  A gel will be applied to your chest.  A wand-like tool (transducer) will be moved over your chest. The gel will help to transmit the sound waves from the transducer.  The sound waves will harmlessly bounce off of your heart to allow the heart images to be captured in real-time motion. The images will be recorded on a computer. The procedure may vary among health care providers and hospitals. What happens after the procedure?  You may return to your normal, everyday life, including diet, activities, and medicines, unless your health care provider tells you not to do that. Summary  An echocardiogram is a procedure that uses painless sound waves (ultrasound) to produce  an image of the heart.  Images from an echocardiogram can provide important information about the size and shape of your heart, heart muscle function, heart valve function, and fluid buildup around your heart.  You do not need to do anything to prepare before this procedure. You may eat and drink normally.  After the  echocardiogram is completed, you may return to your normal, everyday life, unless your health care provider tells you not to do that. This information is not intended to replace advice given to you by your health care provider. Make sure you discuss any questions you have with your health care provider. Document Revised: 01/31/2019 Document Reviewed: 11/12/2016 Elsevier Patient Education  [place].

## 2019-12-31 NOTE — Patient Instructions (Addendum)
Medication Instructions:  Your physician recommends that you continue on your current medications as directed. Please refer to the Current Medication list given to you today.  *If you need a refill on your cardiac medications before your next appointment, please call your pharmacy*  Lab Work: NONE   Testing/Procedures: Your physician has requested that you have an echocardiogram. Echocardiography is a painless test that uses sound waves to create images of your heart. It provides your doctor with information about the size and shape of your heart and how well your heart's chambers and valves are working. This procedure takes approximately one hour. There are no restrictions for this procedure. [place] [address]   Follow-Up: At [facility], you and your health needs are our priority.  As part of our continuing mission to provide you with exceptional heart care, we have created designated Provider Care Teams.  These Care Teams include your primary Cardiologist (physician) and Advanced Practice Providers (APPs -  Physician Assistants and Nurse Practitioners) who all work together to provide you with the care you need, when you need it.  We recommend signing up for the patient portal called "MyChart".  Sign up information is provided on this After Visit Summary.  MyChart is used to connect with patients for Virtual Visits (Telemedicine).  Patients are able to view lab/test results, encounter notes, upcoming appointments, etc.  Non-urgent messages can be sent to your provider as well.   To learn more about what you can do with MyChart, go to [email or web address].    Your next appointment:   2-3 week(s)  The format for your next appointment:   Virtual Visit   Provider:   You may see [name], MD or one of the following Advanced Practice Providers on your designated Care Team:    Rosaria Ferries, PA-C  Jory Sims, DNP, ANP  Cadence Kathlen Mody,  NP   Other Instructions   Echocardiogram An echocardiogram is a procedure that uses painless sound waves (ultrasound) to produce an image of the heart. Images from an echocardiogram can provide important information about:  Signs of coronary artery disease (CAD).  Aneurysm detection. An aneurysm is a weak or damaged part of an artery wall that bulges out from the normal force of blood pumping through the body.  Heart size and shape. Changes in the size or shape of the heart can be associated with certain conditions, including heart failure, aneurysm, and CAD.  Heart muscle function.  Heart valve function.  Signs of a past heart attack.  Fluid buildup around the heart.  Thickening of the heart muscle.  A tumor or infectious growth around the heart valves. Tell a health care provider about:  Any allergies you have.  All medicines you are taking, including vitamins, herbs, eye drops, creams, and over-the-counter medicines.  Any blood disorders you have.  Any surgeries you have had.  Any medical conditions you have.  Whether you are pregnant or may be pregnant. What are the risks? Generally, this is a safe procedure. However, problems may occur, including:  Allergic reaction to dye (contrast) that may be used during the procedure. What happens before the procedure? No specific preparation is needed. You may eat and drink normally. What happens during the procedure?   An IV tube may be inserted into one of your veins.  You may receive contrast through this tube. A contrast is an injection that improves the quality of the pictures from your heart.  A gel  will be applied to your chest.  A wand-like tool (transducer) will be moved over your chest. The gel will help to transmit the sound waves from the transducer.  The sound waves will harmlessly bounce off of your heart to allow the heart images to be captured in real-time motion. The images will be recorded on a  computer. The procedure may vary among health care providers and hospitals. What happens after the procedure?  You may return to your normal, everyday life, including diet, activities, and medicines, unless your health care provider tells you not to do that. Summary  An echocardiogram is a procedure that uses painless sound waves (ultrasound) to produce an image of the heart.  Images from an echocardiogram can provide important information about the size and shape of your heart, heart muscle function, heart valve function, and fluid buildup around your heart.  You do not need to do anything to prepare before this procedure. You may eat and drink normally.  After the echocardiogram is completed, you may return to your normal, everyday life, unless your health care provider tells you not to do that. This information is not intended to replace advice given to you by your health care provider. Make sure you discuss any questions you have with your health care provider. Document Revised: 01/31/2019 Document Reviewed: 11/12/2016 Elsevier Patient Education  [place].

## 2020-01-10 ENCOUNTER — Other Ambulatory Visit (HOSPITAL_COMMUNITY): Payer: Medicare HMO

## 2020-01-13 ENCOUNTER — Ambulatory Visit: Payer: Medicare PPO | Admitting: Nurse Practitioner

## 2020-01-21 ENCOUNTER — Telehealth: Payer: Medicare HMO | Admitting: Internal Medicine

## 2020-01-23 DIAGNOSIS — J9611 Chronic respiratory failure with hypoxia: Secondary | ICD-10-CM | POA: Diagnosis not present

## 2020-01-23 DIAGNOSIS — C3490 Malignant neoplasm of unspecified part of unspecified bronchus or lung: Secondary | ICD-10-CM | POA: Diagnosis not present

## 2020-01-27 ENCOUNTER — Other Ambulatory Visit (HOSPITAL_COMMUNITY): Payer: Medicare HMO

## 2020-02-04 ENCOUNTER — Telehealth: Payer: Medicare HMO | Admitting: Internal Medicine

## 2020-02-06 ENCOUNTER — Other Ambulatory Visit: Payer: Self-pay

## 2020-02-06 ENCOUNTER — Ambulatory Visit (HOSPITAL_COMMUNITY): Payer: Medicare HMO | Attending: Internal Medicine

## 2020-02-06 DIAGNOSIS — I351 Nonrheumatic aortic (valve) insufficiency: Secondary | ICD-10-CM | POA: Diagnosis not present

## 2020-02-14 ENCOUNTER — Telehealth (INDEPENDENT_AMBULATORY_CARE_PROVIDER_SITE_OTHER): Payer: Medicare HMO | Admitting: Internal Medicine

## 2020-02-14 ENCOUNTER — Encounter: Payer: Self-pay | Admitting: Internal Medicine

## 2020-02-14 VITALS — Ht 63.5 in | Wt 164.6 lb

## 2020-02-14 DIAGNOSIS — Z7901 Long term (current) use of anticoagulants: Secondary | ICD-10-CM

## 2020-02-14 DIAGNOSIS — E781 Pure hyperglyceridemia: Secondary | ICD-10-CM

## 2020-02-14 DIAGNOSIS — R079 Chest pain, unspecified: Secondary | ICD-10-CM

## 2020-02-14 DIAGNOSIS — Z9989 Dependence on other enabling machines and devices: Secondary | ICD-10-CM

## 2020-02-14 DIAGNOSIS — I351 Nonrheumatic aortic (valve) insufficiency: Secondary | ICD-10-CM

## 2020-02-14 DIAGNOSIS — G4733 Obstructive sleep apnea (adult) (pediatric): Secondary | ICD-10-CM

## 2020-02-14 DIAGNOSIS — I4891 Unspecified atrial fibrillation: Secondary | ICD-10-CM

## 2020-02-14 DIAGNOSIS — I5032 Chronic diastolic (congestive) heart failure: Secondary | ICD-10-CM | POA: Diagnosis not present

## 2020-02-14 DIAGNOSIS — I350 Nonrheumatic aortic (valve) stenosis: Secondary | ICD-10-CM | POA: Diagnosis not present

## 2020-02-14 NOTE — Progress Notes (Signed)
Virtual Visit via Telephone Note   This visit type was conducted due to national recommendations for restrictions regarding the COVID-19 Pandemic (e.g. social distancing) in an effort to limit this patient's exposure and mitigate transmission in our community.  Due to her co-morbid illnesses, this patient is at least at moderate risk for complications without adequate follow up.  This format is felt to be most appropriate for this patient at this time.  The patient did not have access to video technology/had technical difficulties with video requiring transitioning to audio format only (telephone).  All issues noted in this document were discussed and addressed.  No physical exam could be performed with this format.  Please refer to the patient's chart for her  consent to telehealth for [facility].   The patient was identified using 2 identifiers.  Date:  02/14/2020   ID:  Valerie [name], DOB 09-16-1941, MRN [id number]  Patient Location: Home Provider Location: Office  PCP:  [name], NP  Cardiologist:  [name], MD  Electrophysiologist:  None   Evaluation Performed:  Follow-Up Visit  Chief Complaint:  F/u echocardiogram  History of Present Illness:    Valerie [name] is a 79 y.o. female with atrial fibrillation with slow ventricular response on Xarelto, hyperlipidemia, CHF with multiple exacerbations,, history of lung cancer in remission status post radiation, and dementia.  Her daughter Valerie [name] is available by phone today for a visit.  We reviewed echo results.   Echocardiogram personally interpreted demonstrating borderline LVEF of 50%, and borderline LV end-systolic dimension of 48 mm in the setting of severe aortic valve regurgitation with moderate to severe aortic valve stenosis.  She also demonstrates severe pulmonary hypertension, severe biatrial enlargement, dilated ascending aorta.  I have described for the patient and her daughter the critical nature of these  findings.  At a minimum, she would be a candidate for aortic valve replacement for severe aortic valve regurgitation with moderate to severe aortic valve stenosis.  Her severe pulmonary hypertension is likely contributed to by untreated sleep apnea, she only uses oxygen and no positive pressure ventilation.  The patient's family have all discussed and agree that she would not want any further interventions performed.  I have described that if she were to go for structural heart consultation for mixed aortic valve disease that would likely include a cardiac catheterization, CT scan, and consultation with interventional cardiology as well as cardiac surgery.  Valerie [name] (daughter) tells me that they are quite sure on her decision to not pursue interventions despite abnormal echocardiogram results.  We discussed potential surveillance for dilated ascending aorta, however this would be with an eye towards eventual intervention, which again they have stated that she would not want.  Encouraged patient to use her CPAP if possible to mitigate the effects of severe pulmonary hypertension.  However if she is most comfortable with oxygen at night, quality sleep is reasonable goal for quality of life. The patient does not have symptoms concerning for COVID-19 infection (fever, chills, cough, or new shortness of breath).    Past Medical History:  Diagnosis Date  . Alzheimer disease [facility])    Per new patient packet- [facility]   . Atrial fibrillation ([place]) 2006   Per Records received from [facility]  . Dementia [facility])    Per new patient packet- [facility]   . Low oxygen saturation    Per new patient packet- [facility]   . Lung cancer [facility])    Per new patient packet- [facility]   .  On home oxygen therapy    Per [facility] received from [facility]  . Pneumonia    Per new patient packet- [facility]    Past Surgical History:  Procedure Laterality Date  . PARTIAL HYSTERECTOMY     Still with ovaries, Per new patient packet- [facility]   . RIGHT/LEFT  HEART CATH AND CORONARY ANGIOGRAPHY N/A 06/21/2019   Procedure: RIGHT/LEFT HEART CATH AND CORONARY ANGIOGRAPHY;  Surgeon: [name], [name], MD;  Location: [place] [facility];  Service: Cardiovascular;  Laterality: N/A;     Current Meds  Medication Sig  . atorvastatin (LIPITOR) 20 MG tablet Take 1 tablet (20 mg total) by mouth daily.  . Cholecalciferol (VITAMIN D3 SUPER STRENGTH) 50 MCG (2000 UT) TABS Take 2,000 Units by mouth daily at 8 pm.   . furosemide (LASIX) 20 MG tablet Take 1 tablet (20 mg total) by mouth daily.  . OXYGEN Inhale 3 L into the lungs at bedtime.   . rivaroxaban (XARELTO) 20 MG TABS tablet Take 1 tablet (20 mg total) by mouth daily with supper.  Marland Kitchen spironolactone (ALDACTONE) 25 MG tablet Take 1 tablet (25 mg total) by mouth daily.  . vitamin B-12 (CYANOCOBALAMIN) 1000 MCG tablet Take 1,000 mcg by mouth daily.  . vitamin C (ASCORBIC ACID) 500 MG tablet Take 500 mg by mouth daily.  . Vitamins-Lipotropics (LIPO-FLAVONOID PLUS PO) Take 1,000 mg by mouth daily.     Allergies:   Patient has no known allergies.   Social History   Tobacco Use  . Smoking status: Never Smoker  . Smokeless tobacco: Never Used  Substance Use Topics  . Alcohol use: Never  . Drug use: Never     Family Hx: The patient's family history includes Alzheimer's disease in her father and mother.  ROS:   Please see the history of present illness.     All other systems reviewed and are negative.   Prior CV studies:   The following studies were reviewed today:    Labs/Other Tests and Data Reviewed:    EKG:  No ECG reviewed.  Recent Labs: 04/01/2019: ALT 8; TSH 2.38 06/17/2019: BUN 12; Platelets 140 06/21/2019: Hemoglobin 10.9; Hemoglobin 10.2; Potassium 4.2; Potassium 4.2; Sodium 140; Sodium 135 12/27/2019: Creatinine, Ser 0.80   Recent Lipid Panel Lab Results  Component Value Date/Time   CHOL 139 06/25/2019 09:34 AM   TRIG 49 06/25/2019 09:34 AM   HDL 70 06/25/2019 09:34 AM   CHOLHDL 2.0  06/25/2019 09:34 AM   LDLCALC 55 06/25/2019 09:34 AM    Wt Readings from Last 3 Encounters:  02/14/20 164 lb 9.6 oz (74.7 kg)  12/31/19 161 lb 12.8 oz (73.4 kg)  09/30/19 162 lb (73.5 kg)     Objective:    Vital Signs:  Ht 5' 3.5" (1.613 m)   Wt 164 lb 9.6 oz (74.7 kg)   BMI 28.70 kg/m    VITAL SIGNS:  reviewed GEN:  no acute distress RESPIRATORY:  normal respiratory effort, no increased work of breathing NEURO:  alert and oriented x 3, speech normal PSYCH:  normal affect   ASSESSMENT & PLAN:    1. Severe aortic regurgitation   2. Aortic valve stenosis, etiology of cardiac valve disease unspecified   3. Atrial fibrillation, unspecified type ([place])   4. Chronic diastolic heart failure (HCC)   5. Chest pain of uncertain etiology   6. Chronic anticoagulation   7. OSA (obstructive sleep apnea)   8. Pure hypertriglyceridemia    The patient's chest pain  is likely contributed to by chronic CAD with angina, severe aortic valve regurgitation and moderate to severe aortic valve stenosis with chronic changes to the left ventricle, severe pulmonary hypertension.  We have discussed some medical therapy that may assist with chest pain, however she is also not interested in adding medications to her profile.  I have offered the patient consultation with structural cardiology for evaluation of mixed aortic valve disease and potential transcatheter valve replacement, however she would not like to undergo this evaluation.  We will continue to follow for any symptomatic management we can provide.  Family is grateful for evaluation and management, and will call with any questions or concerns.  COVID-19 Education: The signs and symptoms of COVID-19 were discussed with the patient and how to seek care for testing (follow up with PCP or arrange E-visit).  The importance of social distancing was discussed today.  Time:   Today, I have spent 15 minutes with the patient with telehealth technology  discussing the above problems.     Medication Adjustments/Labs and Tests Ordered: Current medicines are reviewed at length with the patient today.  Concerns regarding medicines are outlined above.   Tests Ordered: No orders of the defined types were placed in this encounter.   Medication Changes: No orders of the defined types were placed in this encounter.     Signed, [name], MD  02/14/2020 2:29 PM    [place]

## 2020-02-14 NOTE — Patient Instructions (Addendum)
Medication Instructions:  Continue same medications *If you need a refill on your cardiac medications before your next appointment, please call your pharmacy*   Lab Work: None ordered    Testing/Procedures: None ordered   Follow-Up: At [facility], you and your health needs are our priority.  As part of our continuing mission to provide you with exceptional heart care, we have created designated Provider Care Teams.  These Care Teams include your primary Cardiologist (physician) and Advanced Practice Providers (APPs -  Physician Assistants and Nurse Practitioners) who all work together to provide you with the care you need, when you need it.  We recommend signing up for the patient portal called "MyChart".  Sign up information is provided on this After Visit Summary.  MyChart is used to connect with patients for Virtual Visits (Telemedicine).  Patients are able to view lab/test results, encounter notes, upcoming appointments, etc.  Non-urgent messages can be sent to your provider as well.   To learn more about what you can do with MyChart, go to [email or web address].    Your next appointment: 6 months   Call in July to schedule Oct appointment     The format for your next appointment: Office or Virtual   Provider: Dr.Archarya

## 2020-02-19 ENCOUNTER — Ambulatory Visit: Payer: Medicare HMO | Admitting: Nurse Practitioner

## 2020-02-22 DIAGNOSIS — J9611 Chronic respiratory failure with hypoxia: Secondary | ICD-10-CM | POA: Diagnosis not present

## 2020-02-22 DIAGNOSIS — C3490 Malignant neoplasm of unspecified part of unspecified bronchus or lung: Secondary | ICD-10-CM | POA: Diagnosis not present

## 2020-03-24 DIAGNOSIS — C3490 Malignant neoplasm of unspecified part of unspecified bronchus or lung: Secondary | ICD-10-CM | POA: Diagnosis not present

## 2020-03-24 DIAGNOSIS — J9611 Chronic respiratory failure with hypoxia: Secondary | ICD-10-CM | POA: Diagnosis not present

## 2020-03-26 ENCOUNTER — Telehealth (INDEPENDENT_AMBULATORY_CARE_PROVIDER_SITE_OTHER): Payer: Self-pay

## 2020-03-26 ENCOUNTER — Telehealth (INDEPENDENT_AMBULATORY_CARE_PROVIDER_SITE_OTHER): Payer: Self-pay | Admitting: Primary Care

## 2020-03-26 ENCOUNTER — Telehealth: Payer: Self-pay | Admitting: Internal Medicine

## 2020-03-26 MED ORDER — RIVAROXABAN 20 MG PO TABS: 20.0000 mg | ORAL_TABLET | Freq: Every day | ORAL | 1 refills | Status: DC

## 2020-03-26 NOTE — Telephone Encounter (Signed)
1) Medication(s) Requested (by name): spironolactone (ALDACTONE) 25 MG tablet atorvastatin (LIPITOR) 20 MG tablet furosemide (LASIX) 20 MG tablet     2) Pharmacy of Choice: [facility] [place] - [address] 3) Special Requests: Please call daughter Bekki Bradeen when medicine is sent to pharmacy   Approved medications will be sent to the pharmacy, we will reach out if there is an issue.  Requests made after 3pm may not be addressed until the following business day!  If a patient is unsure of the name of the medication(s) please note and ask patient to call back when they are able to provide all info, do not send to responsible party until all information is available!

## 2020-03-26 NOTE — Telephone Encounter (Signed)
*  STAT* If patient is at the pharmacy, call can be transferred to refill team.   1. Which medications need to be refilled? (please list name of each medication and dose if known)  rivaroxaban (XARELTO) 20 MG TABS tablet  2. Which pharmacy/location (including street and city if local pharmacy) is medication to be sent to? [place] ([place]), [place] - [address]   3. Do they need a 30 day or 90 day supply? 30 with refills  Pt has 3 pills left  Daughter of the patient called and requested the refill. The daughter was able to get all of her other medications refilled by her PCP but not this one

## 2020-03-26 NOTE — Telephone Encounter (Signed)
Patient called to request refill of spironolactone sent to [facility] [address]. Nat Christen, CMA

## 2020-03-26 NOTE — Telephone Encounter (Signed)
Spoke with patient and advised she contact [facility]. She should not be out of atorvastatin or furosemide until July. Spironolactone should have ran out in march. Patient states she was given additional refills when she moved from [place]. Advised again that she contact the pharmacy. If in fact she needs refills the pharmacy will send our office an electronic request. She verbalized understanding.

## 2020-03-27 ENCOUNTER — Other Ambulatory Visit: Payer: Self-pay | Admitting: Internal Medicine

## 2020-03-27 MED ORDER — SPIRONOLACTONE 25 MG PO TABS: 25.0000 mg | ORAL_TABLET | Freq: Every day | ORAL | 5 refills | Status: DC

## 2020-03-27 NOTE — Telephone Encounter (Signed)
Denied contact cardiologist

## 2020-03-27 NOTE — Telephone Encounter (Signed)
Patients daughter is aware to contact cardiology for spironolactone refill.

## 2020-03-27 NOTE — Telephone Encounter (Signed)
New Message    *STAT* If patient is at the pharmacy, call can be transferred to refill team.   1. Which medications need to be refilled? (please list name of each medication and dose if known) spironolactone (ALDACTONE) 25 MG tablet  2. Which pharmacy/location (including street and city if local pharmacy) is medication to be sent to? [place] ([place]), [place] - [address]  3. Do they need a 30 day or 90 day supply? [place]

## 2020-04-06 DIAGNOSIS — J479 Bronchiectasis, uncomplicated: Secondary | ICD-10-CM | POA: Diagnosis not present

## 2020-04-21 ENCOUNTER — Other Ambulatory Visit: Payer: Self-pay | Admitting: Internal Medicine

## 2020-04-21 NOTE — Telephone Encounter (Signed)
*  STAT* If patient is at the pharmacy, call can be transferred to refill team.   1. Which medications need to be refilled? (please list name of each medication and dose if known) rivaroxaban (XARELTO) 20 MG TABS tablet  2. Which pharmacy/location (including street and city if local pharmacy) is medication to be sent to? [place] ([place]), [place] - [address]  3. Do they need a 30 day or 90 day supply? 30 day  Patient is out of medication

## 2020-04-22 MED ORDER — RIVAROXABAN 20 MG PO TABS: 20.0000 mg | ORAL_TABLET | Freq: Every day | ORAL | 1 refills | Status: DC

## 2020-04-22 NOTE — Telephone Encounter (Signed)
Rx(s) sent to pharmacy electronically.  

## 2020-04-23 DIAGNOSIS — C3490 Malignant neoplasm of unspecified part of unspecified bronchus or lung: Secondary | ICD-10-CM | POA: Diagnosis not present

## 2020-04-23 DIAGNOSIS — J9611 Chronic respiratory failure with hypoxia: Secondary | ICD-10-CM | POA: Diagnosis not present

## 2020-05-24 DIAGNOSIS — J9611 Chronic respiratory failure with hypoxia: Secondary | ICD-10-CM | POA: Diagnosis not present

## 2020-05-24 DIAGNOSIS — C3490 Malignant neoplasm of unspecified part of unspecified bronchus or lung: Secondary | ICD-10-CM | POA: Diagnosis not present

## 2020-06-15 ENCOUNTER — Telehealth: Payer: Self-pay

## 2020-06-15 NOTE — Telephone Encounter (Signed)
Refill came electronically from [facility] [address] ([place]) [place] [address]  [phone]   when I checked we hadn't seen this patient since 06/2019 for Establish Care I refused it and it came back as could not be sent and system said I had to call and verbally   refuse this prescription so I called the pharmacy and spoke with a pharmacy Tech and told her that the patient would have to call our office so we could verify if she was our patient or not before we could approve her refill request

## 2020-06-18 ENCOUNTER — Other Ambulatory Visit: Payer: Self-pay | Admitting: Internal Medicine

## 2020-06-18 MED ORDER — SPIRONOLACTONE 25 MG PO TABS: 25.0000 mg | ORAL_TABLET | Freq: Every day | ORAL | 5 refills | Status: DC

## 2020-06-18 NOTE — Telephone Encounter (Signed)
*  STAT* If patient is at the pharmacy, call can be transferred to refill team.   1. Which medications need to be refilled? (please list name of each medication and dose if known) spironolactone (ALDACTONE) 25 MG tablet  2. Which pharmacy/location (including street and city if local pharmacy) is medication to be sent to? [place] ([place]), [place] - [address]  3. Do they need a 30 day or 90 day supply? 30 day

## 2020-06-18 NOTE — Telephone Encounter (Signed)
Rx has been sent to the pharmacy electronically. ° °

## 2020-06-23 ENCOUNTER — Other Ambulatory Visit: Payer: Self-pay

## 2020-06-23 ENCOUNTER — Encounter (INDEPENDENT_AMBULATORY_CARE_PROVIDER_SITE_OTHER): Payer: Self-pay | Admitting: Family Medicine

## 2020-06-23 ENCOUNTER — Ambulatory Visit (INDEPENDENT_AMBULATORY_CARE_PROVIDER_SITE_OTHER): Payer: Medicare HMO | Admitting: Family Medicine

## 2020-06-23 VITALS — BP 145/65 | HR 49 | Temp 98.1°F | Resp 16 | Ht 64.0 in | Wt 165.0 lb

## 2020-06-23 DIAGNOSIS — I352 Nonrheumatic aortic (valve) stenosis with insufficiency: Secondary | ICD-10-CM

## 2020-06-23 DIAGNOSIS — Z23 Encounter for immunization: Secondary | ICD-10-CM | POA: Diagnosis not present

## 2020-06-23 DIAGNOSIS — R21 Rash and other nonspecific skin eruption: Secondary | ICD-10-CM | POA: Diagnosis not present

## 2020-06-23 DIAGNOSIS — I110 Hypertensive heart disease with heart failure: Secondary | ICD-10-CM

## 2020-06-23 DIAGNOSIS — Z85118 Personal history of other malignant neoplasm of bronchus and lung: Secondary | ICD-10-CM | POA: Diagnosis not present

## 2020-06-23 DIAGNOSIS — J9611 Chronic respiratory failure with hypoxia: Secondary | ICD-10-CM

## 2020-06-23 DIAGNOSIS — I4891 Unspecified atrial fibrillation: Secondary | ICD-10-CM

## 2020-06-23 MED ORDER — TRIAMCINOLONE ACETONIDE 0.1 % EX CREA: 1.0000 "application " | TOPICAL_CREAM | Freq: Two times a day (BID) | CUTANEOUS | 1 refills | Status: DC

## 2020-06-23 MED ORDER — ATORVASTATIN CALCIUM 20 MG PO TABS: 20.0000 mg | ORAL_TABLET | Freq: Every day | ORAL | 1 refills | Status: DC

## 2020-06-23 MED ORDER — FUROSEMIDE 20 MG PO TABS: 20.0000 mg | ORAL_TABLET | Freq: Every day | ORAL | 1 refills | Status: DC

## 2020-06-23 NOTE — Patient Instructions (Signed)
Edema  Edema is when you have too much fluid in your body or under your skin. Edema may make your legs, feet, and ankles swell up. Swelling is also common in looser tissues, like around your eyes. This is a common condition. It gets more common as you get older. There are many possible causes of edema. Eating too much salt (sodium) and being on your feet or sitting for a long time can cause edema in your legs, feet, and ankles. Hot weather may make edema worse. Edema is usually painless. Your skin may look swollen or shiny. Follow these instructions at home:  Keep the swollen body part raised (elevated) above the level of your heart when you are sitting or lying down.  Do not sit still or stand for a long time.  Do not wear tight clothes. Do not wear garters on your upper legs.  Exercise your legs. This can help the swelling go down.  Wear elastic bandages or support stockings as told by your doctor.  Eat a low-salt (low-sodium) diet to reduce fluid as told by your doctor.  Depending on the cause of your swelling, you may need to limit how much fluid you drink (fluid restriction).  Take over-the-counter and prescription medicines only as told by your doctor. Contact a doctor if:  Treatment is not working.  You have heart, liver, or kidney disease and have symptoms of edema.  You have sudden and unexplained weight gain. Get help right away if:  You have shortness of breath or chest pain.  You cannot breathe when you lie down.  You have pain, redness, or warmth in the swollen areas.  You have heart, liver, or kidney disease and get edema all of a sudden.  You have a fever and your symptoms get worse all of a sudden. Summary  Edema is when you have too much fluid in your body or under your skin.  Edema may make your legs, feet, and ankles swell up. Swelling is also common in looser tissues, like around your eyes.  Raise (elevate) the swollen body part above the level of your  heart when you are sitting or lying down.  Follow your doctor's instructions about diet and how much fluid you can drink (fluid restriction). This information is not intended to replace advice given to you by your health care provider. Make sure you discuss any questions you have with your health care provider. Document Revised: 10/13/2017 Document Reviewed: 10/28/2016 Elsevier Patient Education  2020 [employer]  

## 2020-06-23 NOTE — Progress Notes (Signed)
Subjective:  Patient ID: Valerie [name], female    DOB: [birth year]  Age: 78 y.o. MRN: [id number]  CC: No chief complaint on file.   HPI Valerie [name] is a 79 year old female with a history of Lung ca (s/p radiation) in remission, chronic hypoxic respiratory failure (on oxygen at night ), HFpEf (EF 50% from 01/2020), CAD s/p PCI, A.fib, Hyperlipidemia here for a follow up visit. Last seen by Pulmonary, Dr Lanney Gins in 03/2020 and is on surveillance for her lung ca. Had a Telehealth visit with Cardiology in 4/21021 She denies presence of chest pain or dyspnea but does have pedal edema. On further questioning she has been adding salt to her meals after her daughter cooks it. Currently on Lasix  With regards to her A.fib she is on anticoagulation with Xarelto and endorses compliance.  She has a posterior R shoulder rash which is pruritic and uses Calamine lotion and alcohol to control the rash. Never had something similar and denies rash in other body parts.    Past Medical History:  Diagnosis Date  . Alzheimer disease [facility])    Per new patient packet- [facility]   . Atrial fibrillation ([place]) 2006   Per [facility] received from [facility]  . Dementia [facility])    Per new patient packet- [facility]   . Low oxygen saturation    Per new patient packet- [facility]   . Lung cancer [facility])    Per new patient packet- [facility]   . On home oxygen therapy    Per [facility]  . Pneumonia    Per new patient packet- [facility]     Past Surgical History:  Procedure Laterality Date  . PARTIAL HYSTERECTOMY     Still with ovaries, Per new patient packet- [facility]   . RIGHT/LEFT HEART CATH AND CORONARY ANGIOGRAPHY N/A 06/21/2019   Procedure: RIGHT/LEFT HEART CATH AND CORONARY ANGIOGRAPHY;  Surgeon: [name], [name], MD;  Location: [place] [facility];  Service: Cardiovascular;  Laterality: N/A;    Family History  Problem Relation Age of Onset  . Alzheimer's disease Mother   . Alzheimer's disease Father     No Known  Allergies  Outpatient Medications Prior to Visit  Medication Sig Dispense Refill  . Cholecalciferol (VITAMIN D3 SUPER STRENGTH) 50 MCG (2000 UT) TABS Take 2,000 Units by mouth daily at 8 pm.     . OXYGEN Inhale 3 L into the lungs at bedtime.     . rivaroxaban (XARELTO) 20 MG TABS tablet Take 1 tablet (20 mg total) by mouth daily with supper. 90 tablet 1  . spironolactone (ALDACTONE) 25 MG tablet Take 1 tablet (25 mg total) by mouth daily. 30 tablet 5  . vitamin B-12 (CYANOCOBALAMIN) 1000 MCG tablet Take 1,000 mcg by mouth daily.    . vitamin C (ASCORBIC ACID) 500 MG tablet Take 500 mg by mouth daily.    . Vitamins-Lipotropics (LIPO-FLAVONOID PLUS PO) Take 1,000 mg by mouth daily.    Marland Kitchen atorvastatin (LIPITOR) 20 MG tablet Take 1 tablet (20 mg total) by mouth daily. 90 tablet 1  . furosemide (LASIX) 20 MG tablet Take 1 tablet (20 mg total) by mouth daily. 90 tablet 1   No facility-administered medications prior to visit.     ROS Review of Systems  Constitutional: Negative for activity change, appetite change and fatigue.  HENT: Negative for congestion, sinus pressure and sore throat.   Eyes: Negative for visual disturbance.  Respiratory: Negative for cough, chest tightness, shortness  of breath and wheezing.   Cardiovascular: Positive for leg swelling. Negative for chest pain and palpitations.  Gastrointestinal: Negative for abdominal distention, abdominal pain and constipation.  Endocrine: Negative for polydipsia.  Genitourinary: Negative for dysuria and frequency.  Musculoskeletal: Negative for arthralgias and back pain.  Skin: Positive for rash.  Neurological: Negative for tremors, light-headedness and numbness.  Hematological: Does not bruise/bleed easily.  Psychiatric/Behavioral: Negative for agitation and behavioral problems.    Objective:  BP (!) 145/65   Pulse (!) 49   Temp 98.1 F (36.7 C)   Resp 16   Ht 5\' 4"  (1.626 m)   Wt 165 lb (74.8 kg)   SpO2 93%   BMI 28.32  kg/m   BP/Weight 06/23/2020 6/78/9381 0/10/7508  Systolic BP 258 - 527  Diastolic BP 65 - 84  Wt. (Lbs) 165 164.6 161.8  BMI 28.32 28.7 26.92      Physical Exam Constitutional:      Appearance: She is well-developed.  Neck:     Vascular: No JVD.  Cardiovascular:     Rate and Rhythm: Bradycardia present.     Heart sounds: Normal heart sounds. No murmur heard.   Pulmonary:     Effort: Pulmonary effort is normal.     Breath sounds: Normal breath sounds. No wheezing or rales.  Chest:     Chest wall: No tenderness.  Abdominal:     General: Bowel sounds are normal. There is no distension.     Palpations: Abdomen is soft. There is no mass.     Tenderness: There is no abdominal tenderness.  Musculoskeletal:        General: Normal range of motion.     Right lower leg: Edema present.     Left lower leg: Edema present.  Skin:    Comments: Pin point right scapular rash with scabs. Absence of rash on other body parts.  Neurological:     Mental Status: She is alert and oriented to person, place, and time.  Psychiatric:        Mood and Affect: Mood normal.     CMP Latest Ref Rng & Units 12/27/2019 06/21/2019 06/21/2019  Glucose 65 - 99 mg/dL - - -  BUN 8 - 27 mg/dL - - -  Creatinine 0.44 - 1.00 mg/dL 0.80 - -  Sodium 135 - 145 mmol/L - 135 140  Potassium 3.5 - 5.1 mmol/L - 4.2 4.2  Chloride 96 - 106 mmol/L - - -  CO2 20 - 29 mmol/L - - -  Calcium 8.7 - 10.3 mg/dL - - -  Total Protein 6.1 - 8.1 g/dL - - -  Total Bilirubin 0.2 - 1.2 mg/dL - - -  AST 10 - 35 U/L - - -  ALT 6 - 29 U/L - - -    Lipid Panel     Component Value Date/Time   CHOL 139 06/25/2019 0934   TRIG 49 06/25/2019 0934   HDL 70 06/25/2019 0934   CHOLHDL 2.0 06/25/2019 0934   LDLCALC 55 06/25/2019 0934    CBC    Component Value Date/Time   WBC 4.4 06/17/2019 1152   WBC 5.1 04/01/2019 1458   RBC 3.69 (L) 06/17/2019 1152   RBC 3.64 (L) 04/01/2019 1458   HGB 10.9 (L) 06/21/2019 0850   HGB 10.2 (L)  06/21/2019 0850   HGB 10.8 (L) 06/17/2019 1152   HCT 32.0 (L) 06/21/2019 0850   HCT 30.0 (L) 06/21/2019 0850   HCT 32.8 (L) 06/17/2019 1152  PLT 140 (L) 06/17/2019 1152   MCV 89 06/17/2019 1152   MCH 29.3 06/17/2019 1152   MCH 29.1 04/01/2019 1458   MCHC 32.9 06/17/2019 1152   MCHC 31.8 (L) 04/01/2019 1458   RDW 12.3 06/17/2019 1152   LYMPHSABS 1,433 04/01/2019 1458   EOSABS 92 04/01/2019 1458   BASOSABS 31 04/01/2019 1458    No results found for: HGBA1C  Assessment & Plan:  1. Atrial fibrillation, unspecified type ([place]) In sinus rhythm Continue anticoagulation with Xarelto Not on rate control as she is bradycardic Followed by Cardiology  2. Nonrheumatic aortic insufficiency with aortic stenosis Asymptomatic Followed by Cardiology  3. Rash Dermatitis Unknown trigger - triamcinolone cream (KENALOG) 0.1 %; Apply 1 application topically 2 (two) times daily.  Dispense: 45 g; Refill: 1  4. History of lung cancer S/p radiation Currently on surveillance Follow up with Pulmonary  5. Chronic respiratory failure with hypoxia (HCC) On 2L oxygen at night  6. Hypertensive heart disease with heart failure ([place]) EF 50% from 01/2020 Euvolemic She does have pedal edema due to increased sodium intake Advised against adding sodium but to use substituted like Mrs DASH Elevate feet, use compression stockings - atorvastatin (LIPITOR) 20 MG tablet; Take 1 tablet (20 mg total) by mouth daily.  Dispense: 90 tablet; Refill: 1 - furosemide (LASIX) 20 MG tablet; Take 1 tablet (20 mg total) by mouth daily.  Dispense: 90 tablet; Refill: 1   Health Care Maintenance: Mammogram and Colonoscopy not indicated due to age Meds ordered this encounter  Medications  . atorvastatin (LIPITOR) 20 MG tablet    Sig: Take 1 tablet (20 mg total) by mouth daily.    Dispense:  90 tablet    Refill:  1  . furosemide (LASIX) 20 MG tablet    Sig: Take 1 tablet (20 mg total) by mouth daily.    Dispense:  90  tablet    Refill:  1    Follow-up: No follow-ups on file.       Charlott Rakes, MD, FAAFP. [facility], [place]   06/23/2020, 4:21 PM

## 2020-06-24 DIAGNOSIS — J9611 Chronic respiratory failure with hypoxia: Secondary | ICD-10-CM | POA: Diagnosis not present

## 2020-06-24 DIAGNOSIS — C3490 Malignant neoplasm of unspecified part of unspecified bronchus or lung: Secondary | ICD-10-CM | POA: Diagnosis not present

## 2020-06-26 ENCOUNTER — Other Ambulatory Visit: Payer: Self-pay

## 2020-06-26 ENCOUNTER — Telehealth: Payer: Self-pay | Admitting: Internal Medicine

## 2020-06-26 MED ORDER — RIVAROXABAN 20 MG PO TABS: 20.0000 mg | ORAL_TABLET | Freq: Every day | ORAL | 1 refills | Status: DC

## 2020-06-26 NOTE — Telephone Encounter (Signed)
°*  STAT* If patient is at the pharmacy, call can be transferred to refill team.   1. Which medications need to be refilled? (please list name of each medication and dose if known) rivaroxaban (XARELTO) 20 MG TABS tablet  2. Which pharmacy/location (including street and city if local pharmacy) is medication to be sent to? [place] ([place] - [address]  3. Do they need a 30 day or 90 day supply? [place]

## 2020-06-26 NOTE — Telephone Encounter (Signed)
Prescription refill request for Xarelto received.  Indication: Atrial fibrillation Last office visit: 02/14/2020 Valerie [name] Weight: 74.8 kg Age: 79 Scr: 0.8 12/27/2019 CrCl: 67.33 ml/min  Prescription refilled

## 2020-07-24 DIAGNOSIS — J9611 Chronic respiratory failure with hypoxia: Secondary | ICD-10-CM | POA: Diagnosis not present

## 2020-07-24 DIAGNOSIS — C3490 Malignant neoplasm of unspecified part of unspecified bronchus or lung: Secondary | ICD-10-CM | POA: Diagnosis not present

## 2020-08-13 ENCOUNTER — Other Ambulatory Visit: Payer: Self-pay

## 2020-08-13 ENCOUNTER — Encounter: Payer: Self-pay | Admitting: Internal Medicine

## 2020-08-13 ENCOUNTER — Ambulatory Visit: Payer: Medicare HMO | Admitting: Internal Medicine

## 2020-08-13 VITALS — BP 152/70 | HR 47 | Ht 65.0 in | Wt 159.0 lb

## 2020-08-13 DIAGNOSIS — E781 Pure hyperglyceridemia: Secondary | ICD-10-CM

## 2020-08-13 DIAGNOSIS — I350 Nonrheumatic aortic (valve) stenosis: Secondary | ICD-10-CM

## 2020-08-13 DIAGNOSIS — I4891 Unspecified atrial fibrillation: Secondary | ICD-10-CM

## 2020-08-13 DIAGNOSIS — Z7901 Long term (current) use of anticoagulants: Secondary | ICD-10-CM

## 2020-08-13 DIAGNOSIS — I351 Nonrheumatic aortic (valve) insufficiency: Secondary | ICD-10-CM | POA: Diagnosis not present

## 2020-08-13 DIAGNOSIS — I5032 Chronic diastolic (congestive) heart failure: Secondary | ICD-10-CM

## 2020-08-13 DIAGNOSIS — G4733 Obstructive sleep apnea (adult) (pediatric): Secondary | ICD-10-CM

## 2020-08-13 DIAGNOSIS — R079 Chest pain, unspecified: Secondary | ICD-10-CM

## 2020-08-13 DIAGNOSIS — I352 Nonrheumatic aortic (valve) stenosis with insufficiency: Secondary | ICD-10-CM

## 2020-08-13 DIAGNOSIS — I25119 Atherosclerotic heart disease of native coronary artery with unspecified angina pectoris: Secondary | ICD-10-CM

## 2020-08-13 NOTE — Progress Notes (Addendum)
Cardiology Office Note:    Date:  08/13/2020   ID:  Valerie [name], DOB 1941-07-29, MRN [id number]  PCP:  Valerie Perna, NP  Cardiologist:  Valerie Munroe, MD  Electrophysiologist:  None   Referring MD: Valerie Perna, NP   Chief Complaint/Reason for Referral: Mixed AV disease, PHTN, Afib slow SVR  History of Present Illness:    Valerie [name] is a 79 y.o. female with a history of atrial fibrillation with slow ventricular response on Xarelto, hyperlipidemia, CHF with multiple exacerbations,, history of lung cancer in remission status post radiation, and dementia.  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  Severe pulmonary hypertension in the setting of untreated sleep apnea.  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  I followed with Valerie [name] for some time now, and she continues to have chronic chest pain.  She is not inclined to have medication therapy for angina.  We also discussed that beta-blockade is contraindicated in the setting of A. fib with slow ventricular response and heart rate usually in the 40s.  We have briefly discussed indications for pacing if we want to medically manage her chronic CAD, she has expressed to me that she is not interested in procedures on several occasions.  She has been diagnosed with sleep apnea and requires a CPAP but she forgets to use it and is intolerant of the CPAP mask.  She does wear nocturnal oxygen.  Her daughter Valerie [name] is present for the visit today and serves as the interpreter per her preference and provides much of the collaborative history.   Past Medical History:  Diagnosis Date  . Alzheimer disease [facility])    Per new patient packet- [facility]   . Atrial fibrillation ([place]) 2006   Per Records received from [facility]  . Dementia [facility])    Per new patient packet- [facility]   . Low oxygen saturation    Per new patient packet- [facility]   . Lung cancer [facility])    Per new patient packet- [facility]   . On home oxygen therapy    Per Records received from [facility]  . Pneumonia    Per new patient packet- [facility]     Past Surgical History:  Procedure Laterality Date  . PARTIAL HYSTERECTOMY     Still with ovaries, Per new patient packet- [facility]   . RIGHT/LEFT HEART CATH AND CORONARY ANGIOGRAPHY N/A 06/21/2019   Procedure: RIGHT/LEFT HEART CATH AND CORONARY ANGIOGRAPHY;  Surgeon: [name], [name], MD;  Location: [place] [facility];  Service: Cardiovascular;  Laterality: N/A;    Current Medications: Current Meds  Medication Sig  . atorvastatin (LIPITOR) 20 MG tablet Take 1 tablet (20 mg total) by mouth daily.  Marland Kitchen azithromycin (ZITHROMAX) 250 MG tablet Take 250 mg by mouth daily. Pt takes 3 times per week  . Cholecalciferol (VITAMIN D3 SUPER STRENGTH) 50 MCG (2000 UT) TABS Take 2,000 Units by mouth daily at 8 pm.   . furosemide (LASIX) 20 MG tablet Take 1 tablet (20 mg total) by mouth daily.  . OXYGEN Inhale 3 L into the lungs at bedtime.   . rivaroxaban (XARELTO) 20 MG TABS tablet Take 1 tablet (20 mg total) by mouth daily with supper.  Marland Kitchen spironolactone (ALDACTONE) 25 MG tablet Take 1 tablet (25  mg total) by mouth daily.  Marland Kitchen triamcinolone cream (KENALOG) 0.1 % Apply 1 application topically 2 (two) times daily.  . vitamin B-12 (CYANOCOBALAMIN) 1000 MCG tablet Take 1,000 mcg by mouth daily.     Allergies:   Patient has no known allergies.   Social History   Tobacco Use  . Smoking status: Never Smoker  . Smokeless tobacco: Never Used  Vaping Use  . Vaping Use: Never used  Substance Use Topics  . Alcohol use: Never  . Drug use: Never     Family History: The patient's family history includes Alzheimer's disease in her father and mother.  ROS:   Please see the history of present illness.    All other systems  reviewed and are negative.  EKGs/Labs/Other Studies Reviewed:    The following studies were reviewed today:  EKG:  afib , slow ventricular rate, 47 bpm. LAFB, anterior infarct.  Recent Labs: 12/27/2019: Creatinine, Ser 0.80  Recent Lipid Panel    Component Value Date/Time   CHOL 139 06/25/2019 0934   TRIG 49 06/25/2019 0934   HDL 70 06/25/2019 0934   CHOLHDL 2.0 06/25/2019 0934   LDLCALC 55 06/25/2019 0934    Physical Exam:    VS:  BP (!) 152/70   Pulse (!) 47   Ht 5\' 5"  (1.651 m)   Wt 159 lb (72.1 kg)   BMI 26.46 kg/m     Wt Readings from Last 5 Encounters:  08/13/20 159 lb (72.1 kg)  06/23/20 165 lb (74.8 kg)  02/14/20 164 lb 9.6 oz (74.7 kg)  12/31/19 161 lb 12.8 oz (73.4 kg)  09/30/19 162 lb (73.5 kg)    Constitutional: No acute distress Eyes: sclera non-icteric, normal conjunctiva and lids ENMT: normal dentition, moist mucous membranes Cardiovascular: Irregular rhythm, bradycardic, 3/6 systolic ejection murmur and 3/6 holodiastolic murmur.   Respiratory: clear to auscultation bilaterally GI : normal bowel sounds, soft and nontender. No distention.   MSK: extremities warm, well perfused. No edema.  NEURO: grossly nonfocal exam, moves all extremities. PSYCH: alert and oriented x 3, normal mood and affect.   ASSESSMENT:    1. Nonrheumatic aortic insufficiency with aortic stenosis   2. Severe aortic regurgitation   3. Aortic valve stenosis, etiology of cardiac valve disease unspecified   4. Atrial fibrillation, unspecified type ([place])   5. Chronic diastolic heart failure (HCC)   6. Chest pain, unspecified type   7. Chronic anticoagulation   8. OSA (obstructive sleep apnea)   9. Pure hypertriglyceridemia   10. Coronary artery disease involving native coronary artery of native heart with angina pectoris ([place])    PLAN:    I discussed with the patient and her daughter that her aortic valve stenosis will likely progress with time and will likely contribute to  symptoms with progression.  It is also likely that her LV function is low normal with an systolic dimension of 48 mm in the setting of severe aortic valve regurgitation, which would warrant a conversation about valve replacement if she were interested. We have discussed TAVR workup in the past and she has told me she is not interested in procedures.  We also discussed that if she were to undergo TAVR the likelihood she would need a pacemaker is quite high given probable conduction system disease.  I have also encouraged her to use her CPAP given severe pulmonary hypertension which may be multifactorial due to untreated sleep apnea as well as severe aortic valve regurgitation and elevated LVEDP.  At  this time they choose to make no changes to therapy.  I had be happy to see her anytime she is symptomatic for management.  I have encouraged the family to have discussions about what her goals of care are to avoid unnecessary hospitalizations if the patient is not wishing for advanced therapies.  Total time of encounter: 30 minutes total time of encounter, including 20 minutes spent in face-to-face patient care on the date of this encounter. This time includes coordination of care and counseling regarding above mentioned problem list. Remainder of non-face-to-face time involved reviewing chart documents/testing relevant to the patient encounter and documentation in the medical record. I have independently reviewed documentation from referring provider.   Cherlynn Kaiser, MD [place]  [facility]    Medication Adjustments/Labs and Tests Ordered: Current medicines are reviewed at length with the patient today.  Concerns regarding medicines are outlined above.   Orders Placed This Encounter  Procedures  . EKG 12-Lead    No orders of the defined types were placed in this encounter.   Patient Instructions  Medication Instructions:  No Changes In Medications at this time.  *If you need a refill  on your cardiac medications before your next appointment, please call your pharmacy*  Lab Work: None Ordered At This Time.  If you have labs (blood work) drawn today and your tests are completely normal, you will receive your results only by: Marland Kitchen MyChart Message (if you have MyChart) OR . A paper copy in the mail If you have any lab test that is abnormal or we need to change your treatment, we will call you to review the results.  Testing/Procedures: None Ordered At This Time.   Follow-Up: At [facility], you and your health needs are our priority.  As part of our continuing mission to provide you with exceptional heart care, we have created designated Provider Care Teams.  These Care Teams include your primary Cardiologist (physician) and Advanced Practice Providers (APPs -  Physician Assistants and Nurse Practitioners) who all work together to provide you with the care you need, when you need it.  We recommend signing up for the patient portal called "MyChart".  Sign up information is provided on this After Visit Summary.  MyChart is used to connect with patients for Virtual Visits (Telemedicine).  Patients are able to view lab/test results, encounter notes, upcoming appointments, etc.  Non-urgent messages can be sent to your provider as well.   To learn more about what you can do with MyChart, go to [email or web address].    Your next appointment:   1 year(s)  The format for your next appointment:   In Person  Provider:   Cherlynn Kaiser, MD  Other Instructions  Dr. Margaretann Loveless would family to refer to the following.   Per Dr. Margaretann Loveless, Patients aortic valve is severely tight and severely leaking. Patients heart rate is also very slow, with high pressure inside of the lungs. Since patient is not interested in treatment for these conditions, family should consider whether patient would want to be hospitalized if patient were to become sick.

## 2020-08-13 NOTE — Patient Instructions (Signed)
Medication Instructions:  No Changes In Medications at this time.  *If you need a refill on your cardiac medications before your next appointment, please call your pharmacy*  Lab Work: None Ordered At This Time.  If you have labs (blood work) drawn today and your tests are completely normal, you will receive your results only by: Marland Kitchen MyChart Message (if you have MyChart) OR . A paper copy in the mail If you have any lab test that is abnormal or we need to change your treatment, we will call you to review the results.  Testing/Procedures: None Ordered At This Time.   Follow-Up: At [facility], you and your health needs are our priority.  As part of our continuing mission to provide you with exceptional heart care, we have created designated Provider Care Teams.  These Care Teams include your primary Cardiologist (physician) and Advanced Practice Providers (APPs -  Physician Assistants and Nurse Practitioners) who all work together to provide you with the care you need, when you need it.  We recommend signing up for the patient portal called "MyChart".  Sign up information is provided on this After Visit Summary.  MyChart is used to connect with patients for Virtual Visits (Telemedicine).  Patients are able to view lab/test results, encounter notes, upcoming appointments, etc.  Non-urgent messages can be sent to your provider as well.   To learn more about what you can do with MyChart, go to [email or web address].    Your next appointment:   1 year(s)  The format for your next appointment:   In Person  Provider:   Cherlynn Kaiser, MD  Other Instructions  Dr. Margaretann Loveless would family to refer to the following.   Per Dr. Margaretann Loveless, Patients aortic valve is severely tight and severely leaking. Patients heart rate is also very slow, with high pressure inside of the lungs. Since patient is not interested in treatment for these conditions, family should consider whether patient would want to be  hospitalized if patient were to become sick.

## 2020-08-14 ENCOUNTER — Other Ambulatory Visit: Payer: Self-pay

## 2020-08-14 MED ORDER — SPIRONOLACTONE 25 MG PO TABS: 25.0000 mg | ORAL_TABLET | Freq: Every day | ORAL | 11 refills | Status: DC

## 2020-08-17 ENCOUNTER — Telehealth: Payer: Self-pay | Admitting: Internal Medicine

## 2020-08-17 ENCOUNTER — Telehealth: Payer: Self-pay

## 2020-08-17 NOTE — Telephone Encounter (Signed)
Pt daughter called and state they were to talk with family about pt getting a pacemaker.  They have come up with a decision and would like to speak with nurse to tell her what they have decided   Best number [phone]

## 2020-08-17 NOTE — Telephone Encounter (Signed)
Returned pt's daughter, Jill's call regarding the family's desire to move forward with pt getting a pacemaker. She says this was discussed last week at her office visit with MD and the siblings have decided to proceed. They have a few questions. I have routed this to MD's nurse to call them back.

## 2020-08-17 NOTE — Telephone Encounter (Signed)
Pt's daughter is calling back stating she is returning someone's call from our office that reached out with a [place] interpreter, but I was unable to find documentation of who it was. Valerie [name] states a spanish interpreter is not needed when calling back. Please advise.

## 2020-08-19 NOTE — Telephone Encounter (Signed)
Spoke with patients daughter Valerie [name](ok per DPR) who states that patient has spoken with family and would like to proceed and get information regarding a possible Psychologist, forensic. Valerie [name] states that she would like to know the pros and cons regarding this and speak with Dr. Margaretann Loveless. Valerie [name] states that patient reports she feels fine and has no symptoms at this time. Valerie [name] I would forward message to Dr. Margaretann Loveless for review and advice.

## 2020-08-19 NOTE — Telephone Encounter (Signed)
Spoke with Dr. Margaretann Loveless in regards to patient and families questions. Dr. Margaretann Loveless advised setting patient up with appointment to discuss this further.   Spoke with patients daughter Alisah(okay per DPR) and let her know that Dr. Margaretann Loveless suggested setting up appointment to discuss. Appointment made for Friday 10/29 at 10:40am with Dr. Margaretann Loveless. Patients daughter verbalized understanding.

## 2020-08-21 ENCOUNTER — Other Ambulatory Visit: Payer: Self-pay

## 2020-08-21 ENCOUNTER — Ambulatory Visit: Payer: Medicare HMO | Admitting: Internal Medicine

## 2020-08-21 ENCOUNTER — Encounter: Payer: Self-pay | Admitting: Internal Medicine

## 2020-08-21 VITALS — BP 150/70 | HR 51 | Ht 64.0 in | Wt 159.0 lb

## 2020-08-21 DIAGNOSIS — R079 Chest pain, unspecified: Secondary | ICD-10-CM | POA: Diagnosis not present

## 2020-08-21 DIAGNOSIS — I25119 Atherosclerotic heart disease of native coronary artery with unspecified angina pectoris: Secondary | ICD-10-CM

## 2020-08-21 DIAGNOSIS — G4733 Obstructive sleep apnea (adult) (pediatric): Secondary | ICD-10-CM

## 2020-08-21 DIAGNOSIS — I352 Nonrheumatic aortic (valve) stenosis with insufficiency: Secondary | ICD-10-CM | POA: Diagnosis not present

## 2020-08-21 DIAGNOSIS — I4891 Unspecified atrial fibrillation: Secondary | ICD-10-CM

## 2020-08-21 DIAGNOSIS — E781 Pure hyperglyceridemia: Secondary | ICD-10-CM | POA: Diagnosis not present

## 2020-08-21 DIAGNOSIS — I5032 Chronic diastolic (congestive) heart failure: Secondary | ICD-10-CM | POA: Diagnosis not present

## 2020-08-21 DIAGNOSIS — I350 Nonrheumatic aortic (valve) stenosis: Secondary | ICD-10-CM | POA: Diagnosis not present

## 2020-08-21 DIAGNOSIS — Z7901 Long term (current) use of anticoagulants: Secondary | ICD-10-CM | POA: Diagnosis not present

## 2020-08-21 DIAGNOSIS — I351 Nonrheumatic aortic (valve) insufficiency: Secondary | ICD-10-CM

## 2020-08-21 NOTE — Progress Notes (Signed)
Cardiology Office Note:    Date:  08/21/2020   ID:  Valerie [name], DOB 1941/08/25, MRN [id number]  PCP:  Valerie Perna, NP  Cardiologist:  [name], MD  Electrophysiologist:  None   Referring MD: Valerie Perna, NP   Chief Complaint/Reason for Referral: Mixed AV disease, PHTN, Afib slow SVR    History of Present Illness:    Valerie [name] is a 79 y.o. female with a history of atrial fibrillation with slow ventricular response on Xarelto, hyperlipidemia, CHF with multiple exacerbations,, history of lung cancer in remission status post radiation, and dementia.  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  Severe pulmonary hypertension in the setting of untreated sleep apnea.  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   I saw [name] last week and we had discussed aortic valve replacement for mixed aortic valve disease as well as likely need for permanent pacemaker implant if TAVR were pursued.  Our discussion last week resulted in the patient confirming that she was not inclined to have procedures including valve replacement or permanent pacemaker.  Patient's daughter Valerie [name] went home and discussed this with family by phone.  Her family has spoken in favor of her undergoing a TAVR evaluation and any associated testing so that she can have a longer life span and manage her symptoms better.  We discussed this in great detail today.  I have explained that the TAVR evaluation involves CT scans, visits with surgeons and interventional cardiologist, and other testing which could also include coronary angiography.  I discussed that with valve implantation it is likely she will require permanent pacemaker implant afterward.  Based on discussions with family they are  inclined to pursue this at this time.  She continues to have chronic left axillary chest pain which is likely chronic angina.  Not currently on any antianginal therapy per her request.  Past Medical History:  Diagnosis Date  . Alzheimer disease [facility])    Per new patient packet- [facility]   . Atrial fibrillation ([place]) 2006   Per [facility] received from [facility]  . Dementia [facility])    Per new patient packet- [facility]   . Low oxygen saturation    Per new patient packet- [facility]   . Lung cancer [facility])    Per new patient packet- [facility]   . On home oxygen therapy    Per Records received from [facility]  . Pneumonia    Per new patient packet- [facility]     Past Surgical History:  Procedure Laterality Date  . PARTIAL HYSTERECTOMY     Still with ovaries, Per new patient packet- [facility]   . RIGHT/LEFT HEART CATH AND CORONARY ANGIOGRAPHY N/A 06/21/2019   Procedure: RIGHT/LEFT HEART CATH AND CORONARY ANGIOGRAPHY;  Surgeon: [name], [name], MD;  Location: [facility];  Service: Cardiovascular;  Laterality: N/A;    Current Medications: Current Meds  Medication Sig  . atorvastatin (LIPITOR) 20 MG tablet Take 1 tablet (20 mg total) by mouth daily.  Marland Kitchen azithromycin (ZITHROMAX) 250 MG tablet Take 250 mg by mouth daily. Pt takes 3 times per week  . Cholecalciferol (VITAMIN D3 SUPER STRENGTH) 50 MCG (2000 UT) TABS Take 2,000 Units by mouth daily at 8 pm.   . furosemide (LASIX) 20 MG tablet  Take 1 tablet (20 mg total) by mouth daily.  . OXYGEN Inhale 3 L into the lungs at bedtime.   . rivaroxaban (XARELTO) 20 MG TABS tablet Take 1 tablet (20 mg total) by mouth daily with supper.  Marland Kitchen spironolactone (ALDACTONE) 25 MG tablet Take 1 tablet (25 mg total) by mouth daily.  Marland Kitchen triamcinolone cream (KENALOG) 0.1 % Apply 1 application topically 2 (two) times daily.  . vitamin B-12 (CYANOCOBALAMIN) 1000 MCG tablet Take 1,000 mcg by mouth daily.     Allergies:   Patient has no known allergies.   Social History   Tobacco Use  .  Smoking status: Never Smoker  . Smokeless tobacco: Never Used  Vaping Use  . Vaping Use: Never used  Substance Use Topics  . Alcohol use: Never  . Drug use: Never     Family History: The patient's family history includes Alzheimer's disease in her father and mother.  ROS:   Please see the history of present illness.    All other systems reviewed and are negative.  EKGs/Labs/Other Studies Reviewed:    The following studies were reviewed today:  EKG:   Not performed today.   Recent Labs: 12/27/2019: Creatinine, Ser 0.80  Recent Lipid Panel    Component Value Date/Time   CHOL 139 06/25/2019 0934   TRIG 49 06/25/2019 0934   HDL 70 06/25/2019 0934   CHOLHDL 2.0 06/25/2019 0934   LDLCALC 55 06/25/2019 0934    Physical Exam:    VS:  BP (!) 150/70   Pulse (!) 51   Ht 5\' 4"  (1.626 m)   Wt 159 lb (72.1 kg)   SpO2 98%   BMI 27.29 kg/m     Wt Readings from Last 5 Encounters:  08/21/20 159 lb (72.1 kg)  08/13/20 159 lb (72.1 kg)  06/23/20 165 lb (74.8 kg)  02/14/20 164 lb 9.6 oz (74.7 kg)  12/31/19 161 lb 12.8 oz (73.4 kg)    Constitutional: No acute distress Eyes: sclera non-icteric, normal conjunctiva and lids ENMT: Mask in place Cardiovascular: Irregular rhythm and bradycardic, 3/6 systolic ejection murmur and 3/6 holodiastolic murmur. S1 and S2 normal. Radial pulses normal bilaterally. No jugular venous distention.  Respiratory: clear to auscultation bilaterally GI : normal bowel sounds, soft and nontender. No distention.   MSK: extremities warm, well perfused.  Diffuse lower extremity edema.  NEURO: grossly nonfocal exam, moves all extremities. PSYCH: alert and oriented x 3, normal mood and affect.   ASSESSMENT:    1. Severe aortic regurgitation   2. Aortic valve stenosis, etiology of cardiac valve disease unspecified   3. Nonrheumatic aortic insufficiency with aortic stenosis   4. Atrial fibrillation, unspecified type ([place])   5. Chronic diastolic heart  failure (HCC)   6. Chest pain, unspecified type   7. Chronic anticoagulation   8. OSA (obstructive sleep apnea)   9. Pure hypertriglyceridemia   10. Coronary artery disease involving native coronary artery of native heart with angina pectoris [facility])    PLAN:    Patient and her daughter return today after discussing with family her current cardiovascular issues.  They would like to pursue initial steps of treatment for her cardiovascular conditions.  I have explained that this is multi fold.  Reasonable place to start would be repeat an echocardiogram and pursue structural heart consultation.  I suspect that if she undergoes transcatheter aortic valve replacement, she will likely need a pacemaker due to underlying conduction system disease with A. fib slow ventricular response.  In addition she may need coronary angiography as part of the procedural planning, and she is felt to have an occluded RCA with prior infarct.  Finally I have strongly encouraged her to use her CPAP as she has pulmonary hypertension.  Pulmonary hypertension is likely multifactorial in the setting of increased LV pressures, but may have a significant contribution from obstructive sleep apnea that has gone untreated aside from nocturnal oxygen on occasion.  I have discussed the risks and benefits of all of these potential treatment plans.  She can be evaluated for SAVR but I suspect she will be a better TAVR candidate.  I have informed her that she will likely meet with a surgeon as part of her aortic valve replacement planning.   Total time of encounter: 30 minutes total time of encounter, including 25 minutes spent in face-to-face patient care on the date of this encounter. This time includes coordination of care and counseling regarding above mentioned problem list. Remainder of non-face-to-face time involved reviewing chart documents/testing relevant to the patient encounter and documentation in the medical record. I have  independently reviewed documentation from referring provider.   Cherlynn Kaiser, MD [place]  [facility]    Medication Adjustments/Labs and Tests Ordered: Current medicines are reviewed at length with the patient today.  Concerns regarding medicines are outlined above.   Orders Placed This Encounter  Procedures  . ECHOCARDIOGRAM COMPLETE    No orders of the defined types were placed in this encounter.   Patient Instructions  Medication Instructions:  No Changes In Medications at this time.  *If you need a refill on your cardiac medications before your next appointment, please call your pharmacy*  Lab Work: None Ordered At This Time.  If you have labs (blood work) drawn today and your tests are completely normal, you will receive your results only by: Marland Kitchen MyChart Message (if you have MyChart) OR . A paper copy in the mail If you have any lab test that is abnormal or we need to change your treatment, we will call you to review the results.  Testing/Procedures: Your physician has requested that you have an echocardiogram. Echocardiography is a painless test that uses sound waves to create images of your heart. It provides your doctor with information about the size and shape of your heart and how well your heart's chambers and valves are working. You may receive an ultrasound enhancing agent through an IV if needed to better visualize your heart during the echo.This procedure takes approximately one hour. There are no restrictions for this procedure. This will take place at the [address].   Follow-Up: At [facility], you and your health needs are our priority.  As part of our continuing mission to provide you with exceptional heart care, we have created designated Provider Care Teams.  These Care Teams include your primary Cardiologist (physician) and Advanced Practice Providers (APPs -  Physician Assistants and Nurse Practitioners) who all work together to  provide you with the care you need, when you need it.  We recommend signing up for the patient portal called "MyChart".  Sign up information is provided on this After Visit Summary.  MyChart is used to connect with patients for Virtual Visits (Telemedicine).  Patients are able to view lab/test results, encounter notes, upcoming appointments, etc.  Non-urgent messages can be sent to your provider as well.   To learn more about what you can do with MyChart, go to [email or web address].  Your next appointment:   6 week(s)  The format for your next appointment:   In Person  Provider:   Cherlynn Kaiser, MD  Other Instructions  PER DR. Eyvette Cordon- IF VALVE DISEASE HAS WORSENED WILL DISCUSS REFERRAL TO STRUCTURAL HEART TEAM.

## 2020-08-21 NOTE — Patient Instructions (Signed)
Medication Instructions:  No Changes In Medications at this time.  *If you need a refill on your cardiac medications before your next appointment, please call your pharmacy*  Lab Work: None Ordered At This Time.  If you have labs (blood work) drawn today and your tests are completely normal, you will receive your results only by: Marland Kitchen MyChart Message (if you have MyChart) OR . A paper copy in the mail If you have any lab test that is abnormal or we need to change your treatment, we will call you to review the results.  Testing/Procedures: Your physician has requested that you have an echocardiogram. Echocardiography is a painless test that uses sound waves to create images of your heart. It provides your doctor with information about the size and shape of your heart and how well your heart's chambers and valves are working. You may receive an ultrasound enhancing agent through an IV if needed to better visualize your heart during the echo.This procedure takes approximately one hour. There are no restrictions for this procedure. This will take place at the [address].   Follow-Up: At [facility], you and your health needs are our priority.  As part of our continuing mission to provide you with exceptional heart care, we have created designated Provider Care Teams.  These Care Teams include your primary Cardiologist (physician) and Advanced Practice Providers (APPs -  Physician Assistants and Nurse Practitioners) who all work together to provide you with the care you need, when you need it.  We recommend signing up for the patient portal called "MyChart".  Sign up information is provided on this After Visit Summary.  MyChart is used to connect with patients for Virtual Visits (Telemedicine).  Patients are able to view lab/test results, encounter notes, upcoming appointments, etc.  Non-urgent messages can be sent to your provider as well.   To learn more about what you can do with  MyChart, go to [email or web address].    Your next appointment:   6 week(s)  The format for your next appointment:   In Person  Provider:   Cherlynn Kaiser, MD  Other Instructions  PER DR. ACHARYA- IF VALVE DISEASE HAS WORSENED WILL DISCUSS REFERRAL TO STRUCTURAL HEART TEAM.

## 2020-08-24 DIAGNOSIS — J9611 Chronic respiratory failure with hypoxia: Secondary | ICD-10-CM | POA: Diagnosis not present

## 2020-08-24 DIAGNOSIS — C3490 Malignant neoplasm of unspecified part of unspecified bronchus or lung: Secondary | ICD-10-CM | POA: Diagnosis not present

## 2020-09-11 ENCOUNTER — Other Ambulatory Visit: Payer: Self-pay

## 2020-09-11 ENCOUNTER — Encounter (INDEPENDENT_AMBULATORY_CARE_PROVIDER_SITE_OTHER): Payer: Self-pay

## 2020-09-11 ENCOUNTER — Ambulatory Visit (HOSPITAL_COMMUNITY): Payer: Medicare HMO | Attending: Internal Medicine

## 2020-09-11 DIAGNOSIS — I351 Nonrheumatic aortic (valve) insufficiency: Secondary | ICD-10-CM | POA: Diagnosis not present

## 2020-09-11 DIAGNOSIS — I350 Nonrheumatic aortic (valve) stenosis: Secondary | ICD-10-CM | POA: Insufficient documentation

## 2020-09-11 LAB — ECHOCARDIOGRAM COMPLETE
AR max vel: 0.97 cm2
AV Area VTI: 1 cm2
AV Area mean vel: 0.95 cm2
AV Mean grad: 29 mmHg
AV Peak grad: 54.6 mmHg
Ao pk vel: 3.69 m/s
Area-P 1/2: 4.42 cm2
P 1/2 time: 282 msec
S' Lateral: 4.8 cm

## 2020-09-23 DIAGNOSIS — J9611 Chronic respiratory failure with hypoxia: Secondary | ICD-10-CM | POA: Diagnosis not present

## 2020-09-23 DIAGNOSIS — C3490 Malignant neoplasm of unspecified part of unspecified bronchus or lung: Secondary | ICD-10-CM | POA: Diagnosis not present

## 2020-10-02 ENCOUNTER — Telehealth: Payer: Self-pay | Admitting: Internal Medicine

## 2020-10-02 DIAGNOSIS — I351 Nonrheumatic aortic (valve) insufficiency: Secondary | ICD-10-CM

## 2020-10-02 DIAGNOSIS — I350 Nonrheumatic aortic (valve) stenosis: Secondary | ICD-10-CM

## 2020-10-02 NOTE — Telephone Encounter (Signed)
Patient's daughter is returning call to discuss echo results.

## 2020-10-02 NOTE — Telephone Encounter (Signed)
Order placed for referral to Structural Heart Valve Clinic.

## 2020-10-02 NOTE — Telephone Encounter (Signed)
Spoke to patient's daughter Syesha mother's echo results given.Stated she does want mother to have a TAVR.Advised I will send message to Dr.Acharya's RN.

## 2020-10-06 DIAGNOSIS — C3491 Malignant neoplasm of unspecified part of right bronchus or lung: Secondary | ICD-10-CM | POA: Diagnosis not present

## 2020-10-13 ENCOUNTER — Ambulatory Visit: Payer: Medicare HMO | Admitting: Cardiovascular Disease

## 2020-10-13 ENCOUNTER — Encounter: Payer: Self-pay | Admitting: Cardiovascular Disease

## 2020-10-13 ENCOUNTER — Other Ambulatory Visit: Payer: Self-pay

## 2020-10-13 VITALS — BP 130/60 | HR 44 | Ht 64.0 in | Wt 159.0 lb

## 2020-10-13 DIAGNOSIS — I350 Nonrheumatic aortic (valve) stenosis: Secondary | ICD-10-CM

## 2020-10-13 LAB — BASIC METABOLIC PANEL
BUN/Creatinine Ratio: 21 (ref 12–28)
BUN: 16 mg/dL (ref 8–27)
CO2: 32 mmol/L — ABNORMAL HIGH (ref 20–29)
Calcium: 9.2 mg/dL (ref 8.7–10.3)
Chloride: 98 mmol/L (ref 96–106)
Creatinine, Ser: 0.76 mg/dL (ref 0.57–1.00)
GFR calc Af Amer: 86 mL/min/{1.73_m2} (ref >59)
GFR calc non Af Amer: 74 mL/min/{1.73_m2} (ref >59)
Glucose: 89 mg/dL (ref 65–99)
Potassium: 5.1 mmol/L (ref 3.5–5.2)
Sodium: 140 mmol/L (ref 134–144)

## 2020-10-13 NOTE — Patient Instructions (Addendum)
Your cardiac CT is scheduled at [facility] on Thursday, October 15, 2020: [address] [place], [place] [address] [phone]  Please arrive at the [facility] main entrance of [facility] between 10:00AM and 10:15AM for your CT scans at 10:45AM. Proceed to the [facility] Radiology Department (first floor) to check-in and test prep.  Please follow these instructions carefully:  On the Night Before the Test: . Be sure to Drink plenty of water. . Do not consume any caffeinated/decaffeinated beverages or chocolate 12 hours prior to your test. . Do not take any antihistamines 12 hours prior to your test.  On the Day of the Test: . Drink plenty of water. Do not drink any water within one hour of the test. . Do not eat any food 4 hours prior to the test. . You may take your regular medications prior to the test EXCEPT:  o HOLD Furosemide (Lasix) the morning of the test. . FEMALES- please wear underwire-free bra if available      After the Test: . Drink plenty of water. . After receiving IV contrast, you may experience a mild flushed feeling. This is normal. . On occasion, you may experience a mild rash up to 24 hours after the test. This is not dangerous. If this occurs, you can take Benadryl 25 mg and increase your fluid intake. . If you experience trouble breathing, this can be serious. If it is severe call 911 IMMEDIATELY. If it is mild, please call our office.  Once we have confirmed authorization from your insurance company, we will call you to set up a date and time for your test. Based on how quickly your insurance processes prior authorizations requests, please allow up to 4 weeks to be contacted for scheduling your Cardiac CT appointment. Be advised that routine Cardiac CT appointments could be scheduled as many as 8 weeks after your provider has ordered it.  For non-scheduling related questions, please contact the cardiac imaging nurse navigator should you  have any questions/concerns: Marchia Bond, Cardiac Imaging Nurse Navigator Burley Saver, Interim Cardiac Imaging Nurse [place] [facility] Direct Office Dial: [phone]   For scheduling needs, including cancellations and rescheduling, please call [place], [phone].

## 2020-10-13 NOTE — Progress Notes (Signed)
[facility]   MULTIDISCIPLINARY HEART VALVE TEAM  Date:  10/13/2020   ID:  Valerie [name], DOB Nov 01, 1940, MRN [id number]  PCP:  [name], NP   Chief Complaint  Patient presents with  . Shortness of Breath     HISTORY OF PRESENT ILLNESS: Valerie [name] is a 79 y.o. female who presents for evaluation of severe aortic insufficiency and stenosis, referred by Dr Margaretann Loveless.  The patient has been followed for longstanding persistent atrial fibrillation with slow ventricular response anticoagulated with rivaroxaban, congestive heart failure, history of lung cancer status post radiation therapy, and dementia.  She has been noted to have severe pulmonary hypertension with a background of untreated sleep apnea.  The patient was hospitalized in 2019 with respiratory failure and CHF, improved with diuresis over a 10 day hospitalization. She has been followed closely by Dr. Margaretann Loveless and has strongly favored conservative treatment, but has more recently expressed interest in exploring treatment options of her aortic stenosis and other cardiac problems. She established care with Dr Margaretann Loveless in 2020 and she had an echo study in July 2020 demonstrating severe AI, moderate AS, and LVEF 50-55% with normal RV function.   The patient is here with 2 of her daughters today. She lives with her daughter in [place]. She moved to this area from [place], [place] approximately 2 years ago. She is married and her husband is in good health. The patient was diagnosed with right lung CA in 2013 and was treated with radiation therapy at that time. She has established with Dr Dustin Flock at [facility] for oncology follow-up. She was recently seen but those notes are not yet available.  The patient reports exertional dyspnea with low-level activity.  She also has orthopnea but no PND.  She sleeps on 2-3 pillows chronically.  She has occasional chest pain but this is fleeting in nature.  Also reports occasional  lightheadedness but no presyncope or frank syncope.  The patient has not had regular dental care and reports poor dentition.  Past Medical History:  Diagnosis Date  . Alzheimer disease [facility])    Per new patient packet- [facility]   . Atrial fibrillation ([place]) 2006   Per Records received from [facility]  . Dementia [facility])    Per new patient packet- [facility]   . Low oxygen saturation    Per new patient packet- [facility]   . Lung cancer [facility])    Per new patient packet- [facility]   . On home oxygen therapy    Per Records received from [facility]  . Pneumonia    Per new patient packet- [facility]     Current Outpatient Medications  Medication Sig Dispense Refill  . Albuterol Sulfate (PROAIR RESPICLICK) 948 (90 Base) MCG/ACT AEPB Inhale into the lungs.    Marland Kitchen atorvastatin (LIPITOR) 20 MG tablet Take 1 tablet (20 mg total) by mouth daily. 90 tablet 1  . azithromycin (ZITHROMAX) 250 MG tablet Take 250 mg by mouth daily. Pt takes 3 times per week    . Cholecalciferol 50 MCG (2000 UT) TABS Take 2,000 Units by mouth daily at 8 pm.     . furosemide (LASIX) 20 MG tablet Take 1 tablet (20 mg total) by mouth daily. 90 tablet 1  . OXYGEN Inhale 3 L into the lungs at bedtime.     Marland Kitchen PROAIR RESPICLICK 546 (90 Base) MCG/ACT AEPB Inhale into the lungs.    . rivaroxaban (XARELTO) 20 MG TABS tablet Take 1 tablet (20 mg total) by mouth  daily with supper. 90 tablet 1  . spironolactone (ALDACTONE) 25 MG tablet Take 1 tablet (25 mg total) by mouth daily. 30 tablet 11  . triamcinolone cream (KENALOG) 0.1 % Apply 1 application topically 2 (two) times daily. 45 g 1  . vitamin B-12 (CYANOCOBALAMIN) 1000 MCG tablet Take 1,000 mcg by mouth daily.     No current facility-administered medications for this visit.    ALLERGIES:   Patient has no known allergies.   SOCIAL HISTORY:  The patient  reports that she has never smoked. She has never used smokeless tobacco. She reports that she does not drink alcohol and does not use drugs.   FAMILY  HISTORY:  The patient's family history includes Alzheimer's disease in her father and mother.   REVIEW OF SYSTEMS:  Positive for cough, fatigue, memory loss.   All other systems are reviewed and negative.   PHYSICAL EXAM: VS:  BP 130/60   Pulse (!) 44   Ht 5\' 4"  (1.626 m)   Wt 159 lb (72.1 kg)   SpO2 90%   BMI 27.29 kg/m  , BMI Body mass index is 27.29 kg/m. GEN: Well nourished, well developed, pleasant elderly woman in no acute distress HEENT: normal Neck: No JVD. carotids 2+ with bilateral bruits Cardiac: The heart is bradycardic and regular with a mid peaking harsh crescendo decrescendo murmur at the right upper sternal border grade 3/6 and a soft diastolic decrescendo murmur at the left lower sternal border.  Trace bilateral pretibial edema. Respiratory: Diminished breath sounds and crackles in the right lung field, mild crackles on the left GI: soft, nontender, nondistended, + BS MS: no deformity or atrophy Skin: warm and dry, no rash Neuro:  Strength and sensation are intact Psych: euthymic mood, full affect  EKG:  EKG from 08/13/2020 reviewed and demonstrates slow atrial fibrillation 47 bpm, left anterior fascicular block  RECENT LABS: 12/27/2019: Creatinine, Ser 0.80  No results found for requested labs within last 8760 hours.   CrCl cannot be calculated (Patient's most recent lab result is older than the maximum 21 days allowed.).   Wt Readings from Last 3 Encounters:  10/13/20 159 lb (72.1 kg)  08/21/20 159 lb (72.1 kg)  08/13/20 159 lb (72.1 kg)     CARDIAC STUDIES:  Echo:  IMPRESSIONS    1. Left ventricular ejection fraction, by estimation, is 50 to 55%. The  left ventricle has low normal function. The left ventricle has no regional  wall motion abnormalities. There is moderate left ventricular hypertrophy.  Left ventricular diastolic  function could not be evaluated. There is the interventricular septum is  flattened in systole and diastole, consistent with  right ventricular  pressure and volume overload.  2. Left atrial size was Massively dilated (>100 ml/m2).  3. The mitral valve is abnormal. Mild mitral valve regurgitation.  4. The aortic valve is calcified. Aortic valve regurgitation is severe.  Moderate to severe aortic valve stenosis. Aortic regurgitation PHT  measures 282 msec. Aortic valve area, by VTI measures 1.00 cm. Aortic  valve mean gradient measures 29.0 mmHg.  Aortic valve Vmax measures 3.69 m/s. DI is 0.26.  5. Aortic dilatation noted. There is mild dilatation of the ascending  aorta, measuring 39 mm.  6. Right atrial size was severely dilated.  7. Evidence of atrial level shunting detected by color flow Doppler.  There is a small patent foramen ovale with predominantly left to right  shunting across the atrial septum.  8. The inferior vena cava is  dilated in size with <50% respiratory  variability, suggesting right atrial pressure of 15 mmHg.  9. Right ventricular systolic function is normal. The right ventricular  size is normal. There is moderately elevated pulmonary artery systolic  pressure. The estimated right ventricular systolic pressure is 14.4 mmHg.   Comparison(s): No significant change from prior study. 02/06/2020: LVEF  50%, RVSP 60 mmHg, severe biatrial enlargement, severe AI, moderate to  severe AS - mean gradient 30 mmHg.   LEFT VENTRICLE  PLAX 2D  LVIDd:     6.50 cm  LVIDs:     4.80 cm  LV PW:     1.10 cm  LV IVS:    1.40 cm  LVOT diam:   2.20 cm  LV SV:     96  LV SV Index:  54  LVOT Area:   3.80 cm     RIGHT VENTRICLE      IVC  RVSP:      43.4 mmHg IVC diam: 2.20 cm   LEFT ATRIUM       Index    RIGHT ATRIUM      Index  LA diam:    7.00 cm 3.94 cm/m  RA Pressure: 3.00 mmHg  LA Vol (A2C):  186.0 ml 104.81 ml/m RA Area:   28.90 cm  LA Vol (A4C):  177.0 ml 99.74 ml/m RA Volume:  100.00 ml 56.35 ml/m  LA Biplane Vol:  184.0 ml 103.68 ml/m  AORTIC VALVE  AV Area (Vmax):  0.97 cm  AV Area (Vmean):  0.95 cm  AV Area (VTI):   1.00 cm  AV Vmax:      369.40 cm/s  AV Vmean:     246.000 cm/s  AV VTI:      0.958 m  AV Peak Grad:   54.6 mmHg  AV Mean Grad:   29.0 mmHg  LVOT Vmax:     94.58 cm/s  LVOT Vmean:    61.440 cm/s  LVOT VTI:     0.251 m  LVOT/AV VTI ratio: 0.26  AI PHT:      282 msec    AORTA  Ao Root diam: 3.20 cm  Ao Asc diam: 3.90 cm   MV E velocity: 173.80 cm/s TRICUSPID VALVE               TR Peak grad:  40.4 mmHg               TR Vmax:    318.00 cm/s               Estimated RAP: 3.00 mmHg               RVSP:      43.4 mmHg                 SHUNTS               Systemic VTI: 0.25 m               Systemic Diam: 2.20 cm    Cardiac Cath 06-21-2019: Conclusion    The left ventricular systolic function is normal.  LV end diastolic pressure is normal.  The left ventricular ejection fraction is 55-65% by visual estimate.  There is mild aortic valve stenosis.  Hemodynamic findings consistent with mild pulmonary hypertension.   1. Normal left coronary artery- very large dominant vessel. RCA could not be visualized. 2. Normal LV function 3. Mild aortic stenosis. Mean gradient 17 mm Hg. Valve area  1.85 cm squared with index 1.02.  4. Moderate to severe AI 5. Mild pulmonary HTN.  6. Normal LV filling pressures but prominent V wave on wedge tracing. 7. Normal cardiac output.  Plan; per primary cardiology team.   STS RISK CALCULATOR: Isolated AVR: Risk of Mortality: 4.193% Renal Failure: 2.796% Permanent Stroke: 1.677% Prolonged Ventilation: 13.509% DSW Infection: 0.100% Reoperation: 4.120% Morbidity or Mortality: 19.809% Short Length of Stay: 14.689% Long Length of Stay: 14.118%   ASSESSMENT  AND PLAN: 47.  79 year old woman with severe, symptomatic mixed with aortic valve stenosis and insufficiency and chronic combined systolic and diastolic heart failure with New York Heart Association functional class III symptoms.  I have reviewed the natural history of aortic stenosis and insufficiency with the patient and their family members who are present today. We have discussed the limitations of medical therapy and the poor prognosis associated with symptomatic aortic stenosis. We have reviewed potential treatment options, including palliative medical therapy, conventional surgical aortic valve replacement, and transcatheter aortic valve replacement. We discussed treatment options in the context of the patient's specific comorbid medical conditions.    The patient's echo images are personally reviewed.  The echocardiogram is dated 09/11/2020.  She has low normal LV systolic function.  The patient has moderate aortic stenosis with mean gradient 29 mmHg and peak gradient 58 mmHg.  She has moderately severe aortic regurgitation with pressure half-time of 282 ms.  The right coronary cusp of the aortic valve is severely calcified and restricted.  The other leaflets appear to have normal mobility on 2D imaging. Cardiac cath was performed in August 2020, demonstrating no coronary artery disease in the left main, LAD, or large, dominant left circumflex.  The right coronary artery was not able to be cannulated and might be occluded although there is no collateral to this vessel identified.  CTA imaging may help identify the course of the RCA and demonstrate whether it is patent or occluded.   I have specifically discussed considerations around TAVR in this patient with multiple comorbid conditions who exhibits progressive dyspnea and New York Heart Association functional class III symptoms at present.  The patient has been reluctant to undergo any procedures based on review of past notes.  The family members who  were present today (2 daughters) report that she tells her family members that she would like to proceed with treatment so that she could have the best possible quality of life and longevity, but when she goes to see her physicians, she generally declines treatment.  She does have mild dementia with short-term memory problems noted.  I think there are several issues that need to be sorted out as we consider TAVR as a potential treatment option.  First, in reviewing her notes there is some question whether she might have recurrence of her lung cancer based on CT scan from March of this year.  I think it would be reasonable to proceed with TAVR CTA studies of both the heart and the chest, abdomen, and pelvis.  This would also give [place] radiographic assessment of her lung cancer.  Second, in addition to the standard procedural risks of TAVR, I think the patient is at high risk of needing a permanent pacemaker.  She has slow atrial fibrillation with a heart rate in the low 40's. She understands that she might have an indication for permanent pacemaker with or without TAVR over the next year or 2.  However, proceeding with TAVR would certainly increase her risk of heart block  in the short-term and I think there is a high likelihood that she would require permanent pacing during her TAVR hospitalization.  Third, the patient has poor dentition and will require dental evaluation with possible dental extraction.  I will refer her to Dr. Benson Norway for further evaluation.  I have recommended that the patient return for follow-up evaluation once her CTA imaging is completed and she has undergone dental evaluation.  This will also give her time to discuss things further with her family and determine whether she would like to proceed with further evaluation for TAVR.   If she is felt to be an appropriate candidate for TAVR, she will be ultimately be referred for formal cardiac surgical evaluation as part of a multidisciplinary heart  team approach to her care.  Deatra James 10/13/2020 3:28 PM     [place] [id number]  [phone] (office) [phone] (fax)

## 2020-10-15 ENCOUNTER — Other Ambulatory Visit: Payer: Self-pay

## 2020-10-15 ENCOUNTER — Ambulatory Visit (HOSPITAL_COMMUNITY)
Admission: RE | Admit: 2020-10-15 | Discharge: 2020-10-15 | Disposition: A | Discharge status: Home / Self Care | Payer: Medicare HMO | Source: Ambulatory Visit | Attending: Cardiovascular Disease | Admitting: Cardiovascular Disease

## 2020-10-15 DIAGNOSIS — I700 Atherosclerosis of aorta: Secondary | ICD-10-CM | POA: Diagnosis not present

## 2020-10-15 DIAGNOSIS — I350 Nonrheumatic aortic (valve) stenosis: Secondary | ICD-10-CM | POA: Diagnosis not present

## 2020-10-15 DIAGNOSIS — N2889 Other specified disorders of kidney and ureter: Secondary | ICD-10-CM | POA: Diagnosis not present

## 2020-10-15 DIAGNOSIS — R918 Other nonspecific abnormal finding of lung field: Secondary | ICD-10-CM | POA: Diagnosis not present

## 2020-10-15 DIAGNOSIS — I2510 Atherosclerotic heart disease of native coronary artery without angina pectoris: Secondary | ICD-10-CM | POA: Diagnosis not present

## 2020-10-15 MED ORDER — IOHEXOL 350 MG/ML SOLN
80.0000 mL | Freq: Once | INTRAVENOUS | Status: AC | PRN
  Administered 2020-10-15: 80 mL via INTRAVENOUS

## 2020-10-19 ENCOUNTER — Encounter: Payer: Self-pay | Admitting: Physician Assistant

## 2020-10-19 ENCOUNTER — Telehealth: Payer: Self-pay | Admitting: Cardiovascular Disease

## 2020-10-19 DIAGNOSIS — I513 Intracardiac thrombosis, not elsewhere classified: Secondary | ICD-10-CM

## 2020-10-19 MED ORDER — APIXABAN 5 MG PO TABS: ORAL_TABLET | ORAL | 0 refills | Status: DC

## 2020-10-19 NOTE — Telephone Encounter (Addendum)
1. Large filling defect in the tip of the left atrial appendage, indicative of left atrial appendage thrombus.  2. Multiple pulmonary nodules are noted throughout the lungs bilaterally Of note: The patient was diagnosed with right lung CA in 2013 and was treated with radiation therapy at that time.  Took information to DOD Dr. Radford Pax. Awaiting advisement.  Spoke with the patients daughter Seara (patients caregiver) . Asked about Xarelto and if her mom had missed any doses. Stated she didn't want to pay the cost of the Xarelto and that she has only been taking aspirin for the past month.  DOD Dr. Radford Pax to get started on a clot regimen asap, while working with Gerald Stabs, in house pharmacy to find the best option. Patient sent in Eliquis regimen.  Daughter Flordia in agreement with plan. Gave ED precautions for stroke and bleeding. Verbalized understanding.

## 2020-10-19 NOTE — Telephone Encounter (Signed)
Patient with left atrial thrombus and atrial fibrillation who has self discontinued Xarelto due to cost.  Question whether to reinitiate Xarelto/Eliquis on treatment dosing or A Fib dosing.  Decided on Eliquis treatment dosing since it is a high dose for 1 week, rather than 3 weeks and if she develops any form of bleed, we can easily adjust dose down to 5 mg BID.  Spoke with daughter and explained change in meds and she voiced understanding.

## 2020-10-19 NOTE — Telephone Encounter (Signed)
[facility] calling with a call report for the patient

## 2020-10-20 NOTE — Telephone Encounter (Signed)
Spoke with daughter.  Reiterated that dose is 2 tablets (10mg ) twice a day for 7 days then 1 tablet twice a day.  Daughter voiced understanding.

## 2020-10-20 NOTE — Telephone Encounter (Signed)
° ° °  Pt's daughter calling back, she wanted to verify dosage and of medications since it is different on what it says on the botte

## 2020-10-20 NOTE — Telephone Encounter (Signed)
Of note, had recommended to daughter option of starting warfarin with Lovenox bridge however daughter said her mother would not be agreeable to that form of treatment

## 2020-10-21 ENCOUNTER — Telehealth (INDEPENDENT_AMBULATORY_CARE_PROVIDER_SITE_OTHER): Payer: Self-pay

## 2020-10-21 DIAGNOSIS — C3491 Malignant neoplasm of unspecified part of right bronchus or lung: Secondary | ICD-10-CM | POA: Diagnosis not present

## 2020-10-21 NOTE — Telephone Encounter (Signed)
Copied from [place] [id number]. Topic: Quick Communication - See Telephone Encounter >> Oct 21, 2020  4:05 PM Loma Boston wrote: CRM for notification. See Telephone encounter for: 10/21/20.cb [phone] Roland Earl, with [facility] was wanting top know if Sharyn Lull would be attending  for hospice care for pt. She also would need to know who she reports to. CB   Please advise

## 2020-10-22 NOTE — Telephone Encounter (Signed)
Please advise 

## 2020-10-24 DIAGNOSIS — C3490 Malignant neoplasm of unspecified part of unspecified bronchus or lung: Secondary | ICD-10-CM | POA: Diagnosis not present

## 2020-10-24 DIAGNOSIS — J9611 Chronic respiratory failure with hypoxia: Secondary | ICD-10-CM | POA: Diagnosis not present

## 2020-10-26 ENCOUNTER — Telehealth (HOSPITAL_COMMUNITY): Payer: Self-pay

## 2020-10-26 ENCOUNTER — Ambulatory Visit: Payer: Medicare HMO | Admitting: Internal Medicine

## 2020-10-26 NOTE — Telephone Encounter (Signed)
I called and spoke with daughter Valerie [name] about scheduling her mom for a Dental Consultation with Dr. Benson Norway. Valerie [name] stated that they were going out of town and would call to schdule appt when they return.

## 2020-10-27 ENCOUNTER — Telehealth: Payer: Self-pay

## 2020-10-27 NOTE — Telephone Encounter (Signed)
Spoke with patient's daughter Candie and scheduled an in-person Palliative Consult for 11/03/20 @ 9AM  COVID screening was negative. Fully vaccinated. 2 dogs in the home, will put away before NP arrives. Patient lives with daughter Quantisha.  Consent obtained; updated Outlook/Netsmart/Team List and Epic.

## 2020-10-27 NOTE — Telephone Encounter (Signed)
Attempted to contact patient's daughter to schedule a Palliative Care consult appointment. No answer left a message to return call.

## 2020-11-03 ENCOUNTER — Other Ambulatory Visit: Payer: Medicare HMO | Admitting: Hospice

## 2020-11-03 ENCOUNTER — Other Ambulatory Visit: Payer: Self-pay

## 2020-11-03 DIAGNOSIS — Z515 Encounter for palliative care: Secondary | ICD-10-CM

## 2020-11-03 DIAGNOSIS — I352 Nonrheumatic aortic (valve) stenosis with insufficiency: Secondary | ICD-10-CM

## 2020-11-03 NOTE — Progress Notes (Signed)
PATIENT NAME: Valerie [name] [place] [address] [phone] (home)  DOB: 11-25-1940 MRN: [id number]  PRIMARY CARE PROVIDER:    [name], NP,  [address] [place] [address] [phone]  REFERRING PROVIDER:   [name], NP [address] [place],  [place] [address] [phone]  RESPONSIBLE PARTY:   Extended Emergency Contact Information Primary Emergency Contact: Gerardo, Territo Mobile Phone: [phone] Relation: Daughter Secondary Emergency Contact: [facility] Mobile Phone: [phone] Relation: Daughter  I met face to face with patient and family in home/facility.  ADVANCE CARE PLANNING/RECOMMENDATIONS/PLAN:    Visit at the request of Pulmonologist - Dr. Arrie Senate   for palliative consult. Visit consisted of building trust and discussions on Palliative Medicine as specialized medical care for people living with serious illness, aimed at facilitating better quality of life through symptoms relief, assisting with advance care plan and establishing complex decision making. Patient was referred for hospice but service not needed at this time; content with palliative care service. Hammond present during service.  Discussion on the difference between Palliative and Hospice care. Palliative care and hospice have similar goals of managing symptoms, promoting comfort, improving quality of life, and maintaining a person's dignity. However, palliative care may be offered during any phase of a serious illness, while hospice care is usually offered when a person is expected to live for 6 months or less. NP addressed criteria and philosophy of hospice  Visit consisted of counseling and education dealing with the complex and emotionally intense issues of symptom management and palliative care in the setting of serious and potentially life-threatening illness. Patient and family are strong [employer] and this is a source of hope,  support and anchor. Palliative care team will continue to support patient, patient's family, and medical team.  Advance Care Planning: Our advance care planning conversation included a discussion about:    The value and importance of advance care planning  Exploration of goals of care in the event of a sudden injury or illness  Identification and preparation of a healthcare agent          Review and updating or creation of an advance directive document   CODE STATUS: Patient is a DO NOT RESUSCITATE. Signed DNR at home; same document uploaded to Epic.   GOALS OF CARE: Goals of care include to maximize quality of life and symptom management. Patient is not interested in any treatment for her cancer. MOST selections include no feeding tube, IV fluids/antibiotics as indicated, limited additional intervention.  Follow up Palliative Care Visit: Palliative care will continue to follow for goals of care clarification and symptom management.   Symptom Management:  Patient in no acute distress, No pain/distress. She has good appetite, ambulatory without rolling walker. Shortness of breath. Denies shortness of breath; only on moderate/severe exertion; has oxygen and Albuterol at home prn.  Continue medications as ordered: Eliquis, Lipitor, Furosemide, Aldactone,  Memory loss/confusion related to Dementia, continent of bowel and bladder, FAST 4- decreased ability to perform complex tasks.  . She denies chest pain, palpitations, dizziness. Patient is followed by Cardiologist for aortic insufficiency/stenosis. Palliative will continue to monitor for symptom management/decline and make recommendations as needed. Family /Caregiver/Community Supports: Patient lives at home with her husband and daughter Rangely. Swarm is health care agent. Strong family support system identified.    I spent one hour and 30 minutes  providing this initial consultation; time includes time spent with patient/family, chart review,  provider coordination,  and documentation. More than 50% of the time in this consultation was spent on counseling patient and coordinating communication.   CHIEF COMPLAIN/HISTORY OF PRESENT ILLNESS:  Valerie [name] is a 80 y.o. female with multiple medical problems including Lung CA s/p radiation; patient currently refuses any treatment for the CA, history of Afib,  Aortic insufficiency/stenosis- followed by Cardiologist. Patient with history of Alzheimer's Dementia.  History obtained from review of EMR, discussion with [facility] and patient.   Palliative Care was asked to follow this patient by consultation request of Dr. Arrie Senate to help address advance care planning and complex decision making. Thank you for the opportunity to participate in the care of [place]: DNR  PPS: 60%  HOSPICE ELIGIBILITY/DIAGNOSIS: TBD  PAST MEDICAL HISTORY:  Past Medical History:  Diagnosis Date  . Alzheimer disease [facility])    Per new patient packet- [facility]   . Atrial fibrillation ([place]) 2006   Per [facility] received from [facility]  . Dementia [facility])    Per new patient packet- [facility]   . Low oxygen saturation    Per new patient packet- [facility]   . Lung cancer [facility])    Per new patient packet- [facility]   . On home oxygen therapy    Per [facility] received from [facility]  . Pneumonia    Per new patient packet- [facility]     SOCIAL HX:  Social History   Tobacco Use  . Smoking status: Never Smoker  . Smokeless tobacco: Never Used  Substance Use Topics  . Alcohol use: Never   FAMILY HX:  Family History  Problem Relation Age of Onset  . Alzheimer's disease Mother   . Alzheimer's disease Father      Labs:   No results for input(s): WBC, HGB, HCT, PLT, MCV in the last 168 hours. No results for input(s): NA, K, CL, CO2, BUN, CREATININE, GLUCOSE in the last 168 hours.  ALLERGIES: No Known Allergies   PERTINENT MEDICATIONS:  Outpatient Encounter Medications as of 11/03/2020  Medication Sig  . Albuterol  Sulfate (PROAIR RESPICLICK) 056 (90 Base) MCG/ACT AEPB Inhale into the lungs.  Marland Kitchen apixaban (ELIQUIS) 5 MG TABS tablet Take 2 tablets (38m) twice daily for 7 days, then 1 tablet (535m twice daily  . atorvastatin (LIPITOR) 20 MG tablet Take 1 tablet (20 mg total) by mouth daily.  . Marland Kitchenzithromycin (ZITHROMAX) 250 MG tablet Take 250 mg by mouth daily. Pt takes 3 times per week  . Cholecalciferol 50 MCG (2000 UT) TABS Take 2,000 Units by mouth daily at 8 pm.   . furosemide (LASIX) 20 MG tablet Take 1 tablet (20 mg total) by mouth daily.  . OXYGEN Inhale 3 L into the lungs at bedtime.   . Marland KitchenROAIR RESPICLICK 1097990 Base) MCG/ACT AEPB Inhale into the lungs.  . Marland Kitchenpironolactone (ALDACTONE) 25 MG tablet Take 1 tablet (25 mg total) by mouth daily.  . Marland Kitchenriamcinolone cream (KENALOG) 0.1 % Apply 1 application topically 2 (two) times daily.  . vitamin B-12 (CYANOCOBALAMIN) 1000 MCG tablet Take 1,000 mcg by mouth daily.  . [DISCONTINUED] rivaroxaban (XARELTO) 20 MG TABS tablet Take 1 tablet (20 mg total) by mouth daily with supper.   No facility-administered encounter medications on file as of 11/03/2020.    PHYSICAL EXAM/ROS: Height 5 feet 5 inches  Weight 165 Ibs General: NAD, cooperative Cardiovascular: regular rate and rhythm; denies chest pain Pulmonary: no respiratory distress; normal respiratory effort on room air. Abdomen: soft, nontender, +  bowel sounds GU: no suprapubic tenderness Extremities: no edema, no joint deformities Skin: no rashes to visible skin Neurological: Weakness but otherwise nonfocal; mildly forgetful  Note: Portions of this note were generated with Lobbyist. Dictation errors may occur despite best attempts at proofreading.  Teodoro Spray, NP

## 2020-11-12 ENCOUNTER — Other Ambulatory Visit: Payer: Self-pay | Admitting: Cardiology

## 2020-11-12 DIAGNOSIS — I513 Intracardiac thrombosis, not elsewhere classified: Secondary | ICD-10-CM

## 2020-11-12 NOTE — Telephone Encounter (Signed)
Eliquis 5mg  refill request received (recently changed from Xarelto to Eliquis). Patient is 80 years old, weight-72.1kg, Crea-0.76 on 10/13/2020, Diagnosis-Afib and Left Atrial Thrombus per 10/19/2020 telephone message, and last seen by Dr. Burt Knack on 10/13/20, primary Cardiologist is Dr. Margaretann Loveless. Dose is appropriate based on dosing criteria. Will send in refill to requested pharmacy.

## 2020-11-14 ENCOUNTER — Other Ambulatory Visit: Payer: Self-pay | Admitting: Cardiology

## 2020-11-14 DIAGNOSIS — I513 Intracardiac thrombosis, not elsewhere classified: Secondary | ICD-10-CM

## 2020-11-16 ENCOUNTER — Telehealth: Payer: Self-pay | Admitting: Internal Medicine

## 2020-11-16 NOTE — Telephone Encounter (Signed)
Spoke with patients daughter (okay per DPR) who states that patients Eliquis is a little too expensive for patient. Advised patients daughter that 3 boxes of samples have been left at the front desk for pick up along with an application for patient assistance for Eliquis. Advised patients daughter to fill out forms and return to office and that I will fax for them. Patients daughter verbalized understanding and is going to come pick up samples and assistance forms today.   Samples of Eliquis 5mg  drug were given to the patient, quantity 3 bixes, Exp: April 2023

## 2020-11-16 NOTE — Telephone Encounter (Signed)
Pt c/o medication issue:  1. Name of Medication: apixaban (ELIQUIS) 5 MG TABS tablet  2. How are you currently taking this medication (dosage and times per day)? As prescribed   3. Are you having a reaction (difficulty breathing--STAT)? No   4. What is your medication issue? [facility]ia is calling requesting a callback from a nurse to discuss assistance with the price of this medication before picking up the prescription due to the price of it last time. Please advise.

## 2020-11-16 NOTE — Telephone Encounter (Addendum)
Eliquis 5mg  refill request received. Patient is 80 years old, weight-72.1kg, Crea-0.76 on 10/13/2020, Diagnosis-AFIB and left atrial thrombus (see 10/19/20 note), and last seen by Dr. Burt Knack on 10/13/20 (Dr. Alveda Reasons primary) . Dose is appropriate based on dosing criteria. Will send in refill to requested pharmacy.   Refill was sent in 11/12/20-60tabs/5refills to [facility]. Will call to ensure received.   Called the pharmacy and they stated that the refill is ready for pickup as they received it 4 days ago, which is when it was sent from here. Advised I would deny this one, she stated yes that is fine cause the pt has called in the old prescription and the Eliquis refill is here.  Since the electronic refill system is down will deny once able to capture.

## 2020-11-18 NOTE — Telephone Encounter (Signed)
Patient assistance forms have been faxed.

## 2020-11-24 DIAGNOSIS — C3490 Malignant neoplasm of unspecified part of unspecified bronchus or lung: Secondary | ICD-10-CM | POA: Diagnosis not present

## 2020-11-24 DIAGNOSIS — J9611 Chronic respiratory failure with hypoxia: Secondary | ICD-10-CM | POA: Diagnosis not present

## 2020-11-24 NOTE — Telephone Encounter (Signed)
Received call from Patient Assistance, Verdis Frederickson  Form received and form not listed patient receiving Eliquis as outpatient, advised is outpatient She stated she would get form processed

## 2020-11-25 ENCOUNTER — Other Ambulatory Visit: Payer: Self-pay

## 2020-11-25 ENCOUNTER — Ambulatory Visit (HOSPITAL_COMMUNITY): Payer: Medicare HMO | Admitting: Dentistry

## 2020-11-25 VITALS — BP 138/54 | HR 46 | Temp 98.2°F

## 2020-11-25 DIAGNOSIS — K0889 Other specified disorders of teeth and supporting structures: Secondary | ICD-10-CM

## 2020-11-25 DIAGNOSIS — K029 Dental caries, unspecified: Secondary | ICD-10-CM

## 2020-11-25 DIAGNOSIS — K045 Chronic apical periodontitis: Secondary | ICD-10-CM

## 2020-11-25 DIAGNOSIS — K047 Periapical abscess without sinus: Secondary | ICD-10-CM

## 2020-11-25 DIAGNOSIS — K083 Retained dental root: Secondary | ICD-10-CM

## 2020-11-25 DIAGNOSIS — K08409 Partial loss of teeth, unspecified cause, unspecified class: Secondary | ICD-10-CM

## 2020-11-25 DIAGNOSIS — K036 Deposits [accretions] on teeth: Secondary | ICD-10-CM

## 2020-11-25 DIAGNOSIS — I352 Nonrheumatic aortic (valve) stenosis with insufficiency: Secondary | ICD-10-CM

## 2020-11-25 DIAGNOSIS — K0530 Chronic periodontitis, unspecified: Secondary | ICD-10-CM

## 2020-11-25 DIAGNOSIS — K0850 Unsatisfactory restoration of tooth, unspecified: Secondary | ICD-10-CM

## 2020-11-25 DIAGNOSIS — Z7901 Long term (current) use of anticoagulants: Secondary | ICD-10-CM

## 2020-11-25 MED ORDER — AMOXICILLIN 500 MG PO CAPS: 500.0000 mg | ORAL_CAPSULE | Freq: Three times a day (TID) | ORAL | 0 refills | Status: AC

## 2020-11-25 NOTE — Patient Instructions (Signed)
[facility] of [facility] Medicine Dr. Debe Coder B. Benson Norway, DMD Phone: [phone] Fax: [phone]    MOUTH CARE AFTER SURGERY   FACTS:  Ice used in ice bag helps keep the swelling down, and can help lessen the pain.  It is easier to treat pain BEFORE it happens.  Spitting disturbs the clot and may cause bleeding to start again, or to get worse.  Smoking delays healing and can cause complications.  Sharing prescriptions can be dangerous.  Do not take medications not recently prescribed for you.  Antibiotics may stop birth control pills from working.  Use other means of birth control while on antibiotics.  Warm salt water rinses after the first 24 hours will help lessen the swelling:  Use 1/2 teaspoonful of table salt per oz.of water.  DO NOT:  Do not spit.    Do not drink through a straw.  Strongly advised not to smoke, dip snuff or chew tobacco at least for 3 days.  Do not eat sharp or crunchy foods.  Avoid the area of surgery when chewing.  Do not stop your antibiotics before your instructions say to do so.  Do not eat hot foods until bleeding has stopped.  If you need to, let your food cool down to room temperature.  EXPECT:  Some swelling, especially first 2-3 days.  Soreness or discomfort in varying degrees.  Follow your dentist's instructions about how to handle pain before it starts.  Pinkish saliva or light blood in saliva, or on your pillow in the morning.  This can last around 24 hours.  Bruising inside or outside the mouth.  This may not show up until 2-3 days after surgery.  Don't worry, it will go away in time.  Pieces of "bone" may work themselves loose.  It's OK.  If they bother you, let us know.  WHAT TO DO IMMEDIATELY AFTER SURGERY:  Bite on gauze with steady pressure for 30-45 minutes at a time.  Switch out the gauze after 30-45 minutes for clean gauze, and continue this for 1-2 hours or until bleeding subsides.  Do not chew on the  gauze.  Do not lie down flat.  Raise your head support especially for the first 24 hours.  Apply ice to your face on the side of the surgery.  You may apply it 20 minutes on and a few minutes off.  Ice for 8-12 hours.  You may use ice up to 24 hours.  Before the numbness wears off, take a pain pill as instructed.  Prescription pain medication is not always required.  SWELLING:  Expect swelling for the first couple of days.  It should get better after that.  If swelling increases 3 days or so after surgery, let us know as soon as possible.  FEVER:  Take Tylenol every 4 hours if needed to lower your temperature, especially if it is at 100F or higher.  Drink lots of fluids.  If the fever does not go away, let us know.  BREATHING TROUBLE:  Any unusual difficulty breathing means you have to have someone bring you to the emergency room ASAP.  BLEEDING:  Light oozing is expected for 24 hours or so.  Prop head up with pillows.  Do not spit.  Do not confuse bright red fresh flowing blood with lots of saliva colored with a little bit of blood.  If you notice some bleeding, place gauze or a tea bag where it is bleeding and apply CONSTANT pressure by biting  down for 1 hour.  Avoid talking during this time.  Do not remove the gauze or tea bag during this hour to "check" the bleeding.  If you notice bright RED bleeding FLOWING out of particular area, and filling the floor of your mouth, put a wad of gauze on that area, bite down firmly and constantly.  Call us immediately.  If we're closed, have someone bring you to the emergency room.  ORAL HYGIENE:  Brush your teeth as usual after meals and before bedtime.  Use a soft toothbrush around the area of surgery.  DO NOT AVOID BRUSHING.  Otherwise bacteria(germs) will grow and may delay healing or encourage infection.  Since you cannot spit, just gently rinse and let the water flow out of your mouth.  DO NOT SWISH  HARD.  EATING:  Cool liquids are a good point to start.  Increase to soft foods as tolerated.  PRESCRIPTIONS:  Follow the directions for your prescriptions exactly as written.  If your doctor gave you a narcotic pain medication, do not drive, operate machinery or drink alcohol when on that medication.  QUESTIONS:  Call our office during office hours [phone] or call the Emergency Room at [phone].

## 2020-11-25 NOTE — Progress Notes (Signed)
DENTAL VISIT LIMITED EXAM  Service Date:   11/25/2020 Referring Provider:                  Sherren Mocha, MD  Patient Name:   Valerie [name] Date of Birth:   04-Mar-1941 Medical Record Number: [id number]   COVID 19 SCREENING: The patient denies symptoms concerning for COVID-19 infection including fever, chills, cough, or newly developed shortness of breath.  HPI: Valerie [name] is a very pleasant 80 y.o. female with h/o sleep apnea, lung cancer s/p radiation therapy with recent diagnosis of metastatic disease and under palliative care treatment only, CHF, dementia and atrial fibrillation on Eliquis who was recently diagnosed with severe aortic stenosis.   She was originally referred to our clinic for a Pre-TAVR Dental Consultation, however has since been diagnosed with metastatic lung cancer and so heart valve surgery is no longer an option.  She presents today with her daughter for a limited exam for severe dental pain/ ongoing toothache.   Dental History: The patient states that she has not been to a dentist in over 2 years.  Per the patient's daughter, they were living in [place] prior to moving to the area, and since they have moved they have not yet found a dentist.  The patient reports having pain in the upper left quadrant that comes and goes, but is exacerbated when she is chewing/eating.  She says that the pain does not wake her up at night.  She avoids chewing on the left side due to the pain, and describes the pain as sharp. Patient able to manage oral secretions.  Patient denies dysphagia, odynophagia, dysphonia, SOB and neck pain.  Patient denies fever, rigors and malaise.   CHIEF COMPLAINT: "It hurts here when I eat or chew," points to tooth #15.   Patient Active Problem List   Diagnosis Date Noted  . Aortic insufficiency with aortic stenosis 06/21/2019  . Precordial pain 06/21/2019  . Atrial fibrillation ([place]) 06/21/2019  . Hyperlipidemia 06/21/2019   Past Medical History:   Diagnosis Date  . Alzheimer disease [facility])    Per new patient packet- [facility]   . Atrial fibrillation ([place]) 2006   Per Records received from [facility]  . Dementia [facility])    Per new patient packet- [facility]   . Low oxygen saturation    Per new patient packet- [facility]   . Lung cancer [facility])    Per new patient packet- [facility]   . On home oxygen therapy    Per [facility]  . Pneumonia    Per new patient packet- [facility]    Past Surgical History:  Procedure Laterality Date  . PARTIAL HYSTERECTOMY     Still with ovaries, Per new patient packet- [facility]   . RIGHT/LEFT HEART CATH AND CORONARY ANGIOGRAPHY N/A 06/21/2019   Procedure: RIGHT/LEFT HEART CATH AND CORONARY ANGIOGRAPHY;  Surgeon: [name], [name], MD;  Location: [facility];  Service: Cardiovascular;  Laterality: N/A;   No Known Allergies Current Outpatient Medications  Medication Sig Dispense Refill  . Albuterol Sulfate (PROAIR RESPICLICK) 676 (90 Base) MCG/ACT AEPB Inhale into the lungs.    Marland Kitchen apixaban (ELIQUIS) 5 MG TABS tablet Take 1 tablet (5 mg total) by mouth 2 (two) times daily. 60 tablet 5  . atorvastatin (LIPITOR) 20 MG tablet Take 1 tablet (20 mg total) by mouth daily. 90 tablet 1  . azithromycin (ZITHROMAX) 250 MG tablet Take 250 mg by mouth daily. Pt takes 3 times per  week    . Cholecalciferol 50 MCG (2000 UT) TABS Take 2,000 Units by mouth daily at 8 pm.     . furosemide (LASIX) 20 MG tablet Take 1 tablet (20 mg total) by mouth daily. 90 tablet 1  . OXYGEN Inhale 3 L into the lungs at bedtime.     Marland Kitchen PROAIR RESPICLICK 710 (90 Base) MCG/ACT AEPB Inhale into the lungs.    Marland Kitchen spironolactone (ALDACTONE) 25 MG tablet Take 1 tablet (25 mg total) by mouth daily. 30 tablet 11  . triamcinolone cream (KENALOG) 0.1 % Apply 1 application topically 2 (two) times daily. 45 g 1  . vitamin B-12 (CYANOCOBALAMIN) 1000 MCG tablet Take 1,000 mcg by mouth daily.     No current facility-administered medications for this visit.     LABS: Lab Results  Component Value Date   WBC 4.4 06/17/2019   HGB 10.9 (L) 06/21/2019   HGB 10.2 (L) 06/21/2019   HCT 32.0 (L) 06/21/2019   HCT 30.0 (L) 06/21/2019   MCV 89 06/17/2019   PLT 140 (L) 06/17/2019      Component Value Date/Time   NA 140 10/13/2020 1225   K 5.1 10/13/2020 1225   CL 98 10/13/2020 1225   CO2 32 (H) 10/13/2020 1225   GLUCOSE 89 10/13/2020 1225   GLUCOSE 87 04/01/2019 1458   BUN 16 10/13/2020 1225   CREATININE 0.76 10/13/2020 1225   CREATININE 0.65 04/01/2019 1458   CALCIUM 9.2 10/13/2020 1225   GFRNONAA 74 10/13/2020 1225   GFRNONAA 86 04/01/2019 1458   GFRAA 86 10/13/2020 1225   GFRAA 99 04/01/2019 1458   No results found for: INR, PROTIME No results found for: PTT  Social History   Socioeconomic History  . Marital status: Married    Spouse name: Not on file  . Number of children: Not on file  . Years of education: Not on file  . Highest education level: Not on file  Occupational History  . Not on file  Tobacco Use  . Smoking status: Never Smoker  . Smokeless tobacco: Never Used  Vaping Use  . Vaping Use: Never used  Substance and Sexual Activity  . Alcohol use: Never  . Drug use: Never  . Sexual activity: Not on file  Other Topics Concern  . Not on file  Social History Narrative   Per [facility] new patient packet abstracted 03/28/2019      Diet: Vegetarian       Caffeine: None      Married, if yes what year: Yes, 1959      Do you live in a house, apartment, assisted living, condo, trailer, ect: House, 2 stories, 5 persons      Pets: Dog      Highest level of education: 6th grade       Current/Past profession: N/A      Exercise:No         Living Will: No   DNR: No   POA/HPOA: No      Functional Status:   Do you have difficulty bathing or dressing yourself? No   Do you have difficulty preparing food or eating? Yes   Do you have difficulty managing your medications? Yes   Do you have difficulty managing your  finances? Yes   Do you have difficulty affording your medications? Nes   Social Determinants of Health   Financial Resource Strain: Not on file  Food Insecurity: Not on file  Transportation Needs: Not on file  Physical Activity:  Not on file  Stress: Not on file  Social Connections: Not on file  Intimate Partner Violence: Not on file   Family History  Problem Relation Age of Onset  . Alzheimer's disease Mother   . Alzheimer's disease Father     REVIEW OF SYSTEMS: Reviewed with the patient as per HPI. PSYCH: Patient denies having dental phobia.   VITAL SIGNS: BP (!) 138/54 (BP Location: Right Arm)   Pulse (!) 46   Temp 98.2 F (36.8 C)    PHYSICAL EXAMINATION: GENERAL: Well-developed, comfortable and in no apparent distress. NEUROLOGICAL: Alert and oriented to person, place and time. EXTRAORAL:  Facial symmetry present without any edema or erythema.  No swelling or lymphadenopathy.  TMJ asymptomatic without clicks or crepitations. INTRAORAL: Soft tissues appear well-perfused and mucous membranes moist.  FOM and vestibules soft and not raised. Oral cavity without mass or lesion. No signs of infection, parulis, sinus tract, edema or erythema evident upon exam.  DENTAL EXAMINATION: DENTITION: Overall fair remaining dentition.  Missing teeth, caries, moderately restored dentition.  PERIODONTAL: Inflamed, erythematous gingival tissue.  Tooth #15 Class III mobility with severe bone loss on buccal extending to the apex of the root.  Tooth #15 (++) percussion and palpation. DENTAL CARIES/DEFECTIVE RESTORATIONS: Multiple teeth with clinical caries and fractured/broken existing restorations.  #12 retained root tip. CROWN/BRIDGE: #13 existing PFM crown with open mesial margin (poor marginal integrity) PROSTHODONTIC: Patient has an upper RPD/flipper replacing tooth #7. OCCLUSION: Unable to assess molar occlusion.  Multiple teeth that are non-functional and #15 has shifted  palatally.  RADIOGRAPHIC EXAMINATION:  PAN exposed and interpreted Condyles seated bilaterally in fossas.  No evidence of abnormal pathology.  All visualized osseous structures appear WNL.  Patient had a mask with metal sequins that appeared on image. Multiple existing restorations, missing teeth and caries.  #6, #9 and #10 fractured/broken teeth.  #12 retained root tip without evidence of periapical radiolucency. #8, #12 and #13 previously RCT teeth.  #12 full-coverage crown with open mesial margin and inadequate gutta percha fill. #15 periapical radiolucency and severe horizontal and mesial bone loss.  #1, #15, #16 and #31 supra-erupted and mesially drifted.   ASSESSMENT 1. Severe aortic stenosis on Eliquis 2. Metastatic lung cancer 3. Missing teeth 4. Caries 5. Accretions on teeth 6. Loose teeth 7. Chronic periodontitis 8. 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 #15 periapical abscess without sinus 10. Retained dental root 11. Suboptimal dental restoration 12. Postoperative bleeding risk   PLAN/RECOMMENDATIONS  . I discussed the risks, benefits, and complications of various treatment options with the patient in relationship to her medical and dental conditions. I discussed the findings and explained that her pain is likely coming from tooth #15 which is chronically infected and very loose.  The only treatment option for this tooth is to remove the source of infection to decrease her risk of systemic infection and ongoing/worsening dental pain. . We discussed various treatment options to include no treatment, Extraction of tooth #15 under local anesthesia or referral to an outside office with the option of sedation.  The patient verbalized understanding of all options, and currently wishes to proceed with Extraction of tooth #15 today under local anesthesia. . Explained to the patient that although she is taking Eliquis, it is not necessary to stop taking this for only one tooth  extraction.  I explained that she may bleed a little more, however not significantly.  I also explained that I will prescribe her a course of postoperative antibiotics due to  her current medical conditions and to prevent any postoperative infection or complications.  She verbalized understanding and was agreeable to this plan.   PROCEDURE  CONSENT: Surgical consent form was reviewed with the patient, signed by the patient and witness by Leta Speller. ASSISTANT: Molli Posey, DAII ANESTHESIA: Local anesthesia only  SPECIMENS: 1 tooth that was extracted and discarded. DRAINS/CULTURES: None COMPLICATIONS: None INDICATIONS: Tooth #15 non-restorable tooth with hopeless prognosis due to severe periodontal disease and chronic periapical infection  ROUTINE EXTRACTION TOOTH #15: Topical anesthesia was placed on soft tissue adjacent to tooth #15.  Local anesthesia was administered sequentially with a total utilization of 1 cartridge each containing 34 mg of lidocaine with 0.018 mg of epinephrine.  Location of anesthesia included #15 maxillary infiltration and palatal. The Maxillary right quadrant was approached.  Periosteal elevator used to separate gingival tissue. Tooth #15 was then removed with a 150 forcep without complications. Alveoloplasty was then performed utilizing a ronguers. The surgical site was then irrigated with copious amounts of sterile saline and curetted. The tissues were approximated and trimmed appropriately.   END OF PROCEDURE: Thorough oral irrigation with sterile saline was performed. The patient was examined for complications, and none were observed.  Postoperative instructions were given to the patient in both verbal and written format.   POSTOPERATIVE VITALS: BP: 153/56 mm/Hg PRESCRIPTIONS: Amoxicillin 500 mg TID PO sent to patient's pharmacy.  Recommend OTC pain relievers for any moderate pain.   . Discussion of findings with medical team and coordination of future  medical and dental care as needed.  The patient tolerated today's visit well.  All questions and concerns were addressed and the patient departed in stable condition.  Postoperative appointment scheduled for 1 week to evaluate healing course.  I spent in excess of 60 minutes during the conduct of this consultation and >50% of this time involved direct face-to-face encounter for counseling and/or coordination of the patient's care.   London Benson Norway, DMD

## 2020-12-02 ENCOUNTER — Ambulatory Visit (HOSPITAL_COMMUNITY): Payer: Dental | Admitting: Dentistry

## 2020-12-02 ENCOUNTER — Other Ambulatory Visit: Payer: Self-pay

## 2020-12-02 VITALS — BP 140/47 | HR 46 | Temp 98.5°F

## 2020-12-02 DIAGNOSIS — K08199 Complete loss of teeth due to other specified cause, unspecified class: Secondary | ICD-10-CM

## 2020-12-02 NOTE — Progress Notes (Signed)
DENTAL VISIT  POSTOPERATIVE NOTE   Date:   12/02/2020 Patient Name:  Valerie [name] MRN:   [id number]   COVID 19 SCREENING: The patient does not symptoms concerning for COVID-19 infection (Including fever, chills, cough, or new SHORTNESS OF BREATH).     VITALS: BP (!) 140/47 (BP Location: Right Arm)   Pulse (!) 46   Temp 98.5 F (36.9 C)    . Patient presents today with her daughter for a post-op visit s/p extractions of tooth #15 on 2/2. Marland Kitchen Medical and dental history reviewed with the patient.  No changes reported.   CHIEF COMPLAINT: Patient's daughter states that the patient was bleeding quite a bit the first couple of days.  It has since subsided, but she is still noticing some oozing.   Patient Active Problem List   Diagnosis Date Noted  . Aortic insufficiency with aortic stenosis 06/21/2019  . Precordial pain 06/21/2019  . Atrial fibrillation ([place]) 06/21/2019  . Hyperlipidemia 06/21/2019   Past Medical History:  Diagnosis Date  . Alzheimer disease [facility])    Per [facility] patient packet- [facility]   . Atrial fibrillation ([place]) 2006   Per [facility]  . Dementia [facility])    Per new patient packet- [facility]   . Low oxygen saturation    Per new patient packet- [facility]   . Lung cancer [facility])    Per new patient packet- [facility]   . On home oxygen therapy    Per Records received from [facility]  . Pneumonia    Per new patient packet- [facility]    Past Surgical History:  Procedure Laterality Date  . PARTIAL HYSTERECTOMY     Still with ovaries, Per new patient packet- [facility]   . RIGHT/LEFT HEART CATH AND CORONARY ANGIOGRAPHY N/A 06/21/2019   Procedure: RIGHT/LEFT HEART CATH AND CORONARY ANGIOGRAPHY;  Surgeon: [name], [name], MD;  Location: [facility];  Service: Cardiovascular;  Laterality: N/A;   Current Outpatient Medications  Medication Sig Dispense Refill  . Albuterol Sulfate (PROAIR RESPICLICK) 976 (90 Base) MCG/ACT AEPB Inhale into the lungs.    Marland Kitchen amoxicillin (AMOXIL) 500  MG capsule Take 1 capsule (500 mg total) by mouth 3 (three) times daily for 7 days. Take two capsules now, then one capsule by mouth every 8 hours until all gone. 21 capsule 0  . apixaban (ELIQUIS) 5 MG TABS tablet Take 1 tablet (5 mg total) by mouth 2 (two) times daily. 60 tablet 5  . atorvastatin (LIPITOR) 20 MG tablet Take 1 tablet (20 mg total) by mouth daily. 90 tablet 1  . azithromycin (ZITHROMAX) 250 MG tablet Take 250 mg by mouth daily. Pt takes 3 times per week    . Cholecalciferol 50 MCG (2000 UT) TABS Take 2,000 Units by mouth daily at 8 pm.     . furosemide (LASIX) 20 MG tablet Take 1 tablet (20 mg total) by mouth daily. 90 tablet 1  . OXYGEN Inhale 3 L into the lungs at bedtime.     Marland Kitchen PROAIR RESPICLICK 734 (90 Base) MCG/ACT AEPB Inhale into the lungs.    Marland Kitchen spironolactone (ALDACTONE) 25 MG tablet Take 1 tablet (25 mg total) by mouth daily. 30 tablet 11  . triamcinolone cream (KENALOG) 0.1 % Apply 1 application topically 2 (two) times daily. 45 g 1  . vitamin B-12 (CYANOCOBALAMIN) 1000 MCG tablet Take 1,000 mcg by mouth daily.     No current facility-administered medications for this visit.   No Known Allergies  LABS: Lab Results  Component Value Date   WBC 4.4 06/17/2019   HGB 10.9 (L) 06/21/2019   HGB 10.2 (L) 06/21/2019   HCT 32.0 (L) 06/21/2019   HCT 30.0 (L) 06/21/2019   MCV 89 06/17/2019   PLT 140 (L) 06/17/2019   BMET    Component Value Date/Time   NA 140 10/13/2020 1225   K 5.1 10/13/2020 1225   CL 98 10/13/2020 1225   CO2 32 (H) 10/13/2020 1225   GLUCOSE 89 10/13/2020 1225   GLUCOSE 87 04/01/2019 1458   BUN 16 10/13/2020 1225   CREATININE 0.76 10/13/2020 1225   CREATININE 0.65 04/01/2019 1458   CALCIUM 9.2 10/13/2020 1225   GFRNONAA 74 10/13/2020 1225   GFRNONAA 86 04/01/2019 1458   GFRAA 86 10/13/2020 1225   GFRAA 99 04/01/2019 1458    No results found for: INR, PROTIME No results found for: PTT   EXAM: Extraction sites appear to be healing WNL.   No signs of wound dehiscence or infection evident upon examination.  Small liver clot evident in #15 extraction site.   PROCEDURE: The patient was given a chlorhexidine gluconate rinse for 30 seconds. #15 extraction site was irrigated with sterile saline and curetted to remove liver clot and dead tissue.  Site was irrigated again with sterile saline.  Patient tolerated the procedure well.   ASSESSMENT: Postoperative course is consistent with dental procedures performed.   PLAN: 1. Patient is to return to our clinic as needed.  Patient is to call should any questions or issues arise, or if bleeding starts again. 2. Patient to return to her regular dentist for routine dental care including cleanings and exams.  . All questions and concerns were addressed, and patient and patient's daughter verbalized understanding of discussion and plan.   . Patient tolerated today's visit well and departed in stable condition.   Barclay Benson Norway, DMD

## 2020-12-10 NOTE — Telephone Encounter (Signed)
Spoke with patients daughter Pancake) okay per DPR. Shann states that they had received the letter stating that the patient assistance was not approved.  Norabelle states that they were how ever able to afford the Eliquis now as it is 45$ Monthly. Almyra Birman that some samples of Eliquis 5mg  will be at the front desk for pick up if needed.   Letesha Klecker to call back to office with any issues, questions, or concerns. Mia verbalized understanding.

## 2020-12-21 ENCOUNTER — Other Ambulatory Visit: Payer: Medicare HMO | Admitting: Hospice

## 2020-12-21 ENCOUNTER — Other Ambulatory Visit: Payer: Self-pay

## 2020-12-21 DIAGNOSIS — Z515 Encounter for palliative care: Secondary | ICD-10-CM | POA: Diagnosis not present

## 2020-12-21 DIAGNOSIS — C3480 Malignant neoplasm of overlapping sites of unspecified bronchus and lung: Secondary | ICD-10-CM | POA: Diagnosis not present

## 2020-12-21 DIAGNOSIS — M545 Low back pain, unspecified: Secondary | ICD-10-CM | POA: Diagnosis not present

## 2020-12-21 NOTE — Progress Notes (Signed)
[facility] Consult Note Telephone: [phone]  Fax: [phone]  PATIENT NAME: Valerie [name] DOB: Apr 10, 1941 MRN: [id number]  PRIMARY CARE PROVIDER:   [name], NP [name], NP [address] [place],  [place] [address]  REFERRING PROVIDER: [name], NP [name], NP [address],  [place] [address]   RESPONSIBLE PARTY:   Extended Emergency Contact Information Primary Emergency Contact: Patience, Nuzzo Mobile Phone: [phone] Relation: Daughter Secondary Emergency Contact: [facility] Mobile Phone: [phone] Relation: Daughter  Visit is to build trust and highlight Palliative Medicine as specialized medical care for people living with serious illness, aimed at facilitating better quality of life through symptoms relief, assisting with advance care plan and establishing goals of care.   CHIEF COMPLAINT: Palliative focused  Visit/low back pain  RECOMMENDATIONS/PLAN:   1. Advance Care Planning/Code Status: Code status reviewed today.  Patient is a DO NOT RESUSCITATE  2. GOALS OF CARE: Goals of care include to maximize quality of life and symptom management. Patient is not interested in any treatment for her cancer. MOST selections include no feeding tube, IV fluids/antibiotics as indicated, limited additional intervention.  Visit consisted of counseling and education dealing with the complex and emotionally intense issues of symptom management and palliative care in the setting of serious and potentially life-threatening illness. Palliative care team will continue to support patient, patient's family, and medical team.  I spent 20 minutes providing this consultation. More than 50% of the time in this consultation was spent on coordinating communication.   -------------------------------------------------------------------------------------------------------------------------------------------------- 3. Symptom management/Plan:  Low back pain: Use heating pad.  Activity as tolerated.  Balance of rest and performance activity.  Tylenol 500 mg 3 times daily prn pain.  Palliative will continue to monitor for symptom management/decline and make recommendations as needed. Return 2 months or prn. Encouraged to call provider sooner with any concerns.   HISTORY OF PRESENT ILLNESS:  [name] is a 80 y.o. female with multiple medical problems including low back pain which is chronic, intermittent; pain 4 out of 10 on a pain scale, helped when she changes her position and worse when she sits for too long. When pain comes it causes her discomfort and impairs her independence. History of Lung CA s/p radiation; patient currently refuses any treatment for the CA, history of Afib,  Aortic insufficiency/stenosis- followed by Cardiologist. Patient with history of Alzheimer's Dementia. History obtained from review of EMR, discussion with patient/family. Review and summarization of Epic records shows history from other than patient. Rest of 10 point ROS asked and negative. Palliative Care was asked to follow this patient by consultation request of Pulmonologist - Dr. Arrie Senate to help address complex decision making in the context of goals of care.   CODE STATUS: DNR  PPS: 60%  HOSPICE ELIGIBILITY/DIAGNOSIS: TBD  PAST MEDICAL HISTORY:  Past Medical History:  Diagnosis Date  . Alzheimer disease [facility])    Per [facility]- [facility]   . Atrial fibrillation ([place]) 2006   Per [facility] received from [facility]  . Dementia [facility])    Per new patient packet- [facility]   . Low oxygen saturation    Per new patient packet- [facility]   . Lung cancer [facility])    Per new patient packet- [facility]   . On home oxygen therapy    Per Records received from [facility]  . Pneumonia    Per  new patient packet- [place]  HX: @SOCX  Patient at home  for ongoing care   FAMILY HX:  Family History  Problem Relation Age of Onset  . Alzheimer's disease Mother   . Alzheimer's disease Father     Review lab tests/diagnostics No results for input(s): WBC, HGB, HCT, PLT, MCV in the last 168 hours. No results for input(s): NA, K, CL, CO2, BUN, CREATININE, GLUCOSE in the last 168 hours. Latest GFR by Cockcroft Gault (not valid in AKI or ESRD) CrCl cannot be calculated (Patient's most recent lab result is older than the maximum 21 days allowed.). No results for input(s): AST, ALT, ALKPHOS, GGT in the last 168 hours.  Invalid input(s): TBILI, CONJBILI, ALB, TOTALPROTEIN No components found for: ALB No results for input(s): APTT, INR in the last 168 hours.  Invalid input(s): PTPATIENT No results for input(s): BNP, PROBNP in the last 168 hours.  ALLERGIES: No Known Allergies    PERTINENT MEDICATIONS:  Outpatient Encounter Medications as of 12/21/2020  Medication Sig  . Albuterol Sulfate (PROAIR RESPICLICK) 161 (90 Base) MCG/ACT AEPB Inhale into the lungs.  Marland Kitchen apixaban (ELIQUIS) 5 MG TABS tablet Take 1 tablet (5 mg total) by mouth 2 (two) times daily.  Marland Kitchen atorvastatin (LIPITOR) 20 MG tablet Take 1 tablet (20 mg total) by mouth daily.  Marland Kitchen azithromycin (ZITHROMAX) 250 MG tablet Take 250 mg by mouth daily. Pt takes 3 times per week  . Cholecalciferol 50 MCG (2000 UT) TABS Take 2,000 Units by mouth daily at 8 pm.   . furosemide (LASIX) 20 MG tablet Take 1 tablet (20 mg total) by mouth daily.  . OXYGEN Inhale 3 L into the lungs at bedtime.   Marland Kitchen PROAIR RESPICLICK 096 (90 Base) MCG/ACT AEPB Inhale into the lungs.  Marland Kitchen spironolactone (ALDACTONE) 25 MG tablet Take 1 tablet (25 mg total) by mouth daily.  Marland Kitchen triamcinolone cream (KENALOG) 0.1 % Apply 1 application topically 2 (two) times daily.  . vitamin B-12 (CYANOCOBALAMIN) 1000 MCG tablet Take 1,000 mcg by mouth daily.  . [DISCONTINUED]  rivaroxaban (XARELTO) 20 MG TABS tablet Take 1 tablet (20 mg total) by mouth daily with supper.   No facility-administered encounter medications on file as of 12/21/2020.    ROS  General: NAD EYES: denies vision changes ENMT: denies dysphagia no xerostomia Cardiovascular: denies chest pain Pulmonary: denies  cough, denies SOB  Abdomen: endorses fair appetite, no constipation or diarrhea GU: denies dysuria or urinary frquency MSK:  endorses ROM limitations, no falls reported; Occasional low back pain Skin: denies rashes or wounds Neurological: endorses weakness, denies insomnia Psych: Endorses positive mood Heme/lymph/immuno: denies bruises, abnormal bleeding   PHYSICAL EXAM  General: In no acute distress,  Cardiovascular: regular rate and rhythm Pulmonary: no cough, no increased work of breathing, normal respiratory effort Abdomen: soft, non tender, positive bowel sounds in all quadrants GU:  no suprapubic tenderness Eyes: Normal lids, no discharge, sclera anicteric ENMT: Moist mucous membranes Musculoskeletal:  no edema in BLE Skin: no rash to visible skin, warm without cyanosis Psych: non-anxious affect Neurological: Weakness but otherwise non focal Heme/lymph/immuno: no bruises, no bleeding  Thank you for the opportunity to participate in the care of Ayanna Gheen Please call our office at [phone] if we can be of additional assistance.  Note: Portions of this note were generated with Lobbyist. Dictation errors may occur despite best attempts at proofreading.  Teodoro Spray, NP

## 2020-12-22 DIAGNOSIS — J9611 Chronic respiratory failure with hypoxia: Secondary | ICD-10-CM | POA: Diagnosis not present

## 2020-12-22 DIAGNOSIS — C3490 Malignant neoplasm of unspecified part of unspecified bronchus or lung: Secondary | ICD-10-CM | POA: Diagnosis not present

## 2020-12-30 ENCOUNTER — Ambulatory Visit: Payer: Medicare HMO | Admitting: Internal Medicine

## 2021-01-07 ENCOUNTER — Other Ambulatory Visit: Payer: Self-pay | Admitting: Internal Medicine

## 2021-01-07 ENCOUNTER — Other Ambulatory Visit (INDEPENDENT_AMBULATORY_CARE_PROVIDER_SITE_OTHER): Payer: Self-pay | Admitting: Family Medicine

## 2021-01-07 DIAGNOSIS — I110 Hypertensive heart disease with heart failure: Secondary | ICD-10-CM

## 2021-01-07 NOTE — Telephone Encounter (Signed)
Requested medications are due for refill today yes  Requested medications are on the active medication list yes  Last refill 10/14/20  Last visit 05/2020  Future visit scheduled no  Notes to clinic Failed protocol  due to last valid labs were 06/2019, more than 360 days ago.

## 2021-01-14 ENCOUNTER — Other Ambulatory Visit: Payer: Self-pay

## 2021-01-14 MED ORDER — SPIRONOLACTONE 25 MG PO TABS: 25.0000 mg | ORAL_TABLET | Freq: Every day | ORAL | 11 refills | Status: DC

## 2021-01-20 ENCOUNTER — Other Ambulatory Visit: Payer: Self-pay

## 2021-01-20 ENCOUNTER — Other Ambulatory Visit: Payer: Medicare HMO | Admitting: Hospice

## 2021-01-20 DIAGNOSIS — M545 Low back pain, unspecified: Secondary | ICD-10-CM

## 2021-01-20 DIAGNOSIS — Z515 Encounter for palliative care: Secondary | ICD-10-CM | POA: Diagnosis not present

## 2021-01-20 MED ORDER — SPIRONOLACTONE 25 MG PO TABS: 25.0000 mg | ORAL_TABLET | Freq: Every day | ORAL | 3 refills | Status: DC

## 2021-01-20 NOTE — Progress Notes (Signed)
[place] Consult Note Telephone: [phone]  Fax: [phone]  PATIENT NAME: Valerie [name] DOB: January 08, 1941 MRN: [id number]  PRIMARY CARE PROVIDER:   [name], NP Valerie Perna, NP [address] [place],  [place] [address]  REFERRING PROVIDER: [name], NP Valerie Perna, NP [address] [place],  [place] [address]  RESPONSIBLE PARTY:Extended Emergency Contact Information Primary Emergency Contact: Valerie, [name] Mobile Phone: [phone] Relation: Daughter Secondary Emergency Contact: Valerie [name],Valerie [name] Mobile Phone: [phone] Relation: Daughter  TELEHEALTH VISIT STATEMENT Due to the COVID-19 crisis, this visit was done via telemedicine and it was initiated and consent by this patient and or family. Video-audio (telehealth) contact was unable to be done due to technical barriers from the patient's side.  Visit is to build trust and highlight Palliative Medicine as specialized medical care for people living with serious illness, aimed at facilitating better quality of life through symptoms relief, assisting with advance care plan and establishing goals of care.  Valerie [name]  is home with patient during visit.  CHIEF COMPLAINT: Palliative follow up visit/low back pain  RECOMMENDATIONS/PLAN:   1. Advance Care Planning/Code Status: Patient affirmed she is a DO NOT RESUSCITATE.  2. Goals of Care:Goals of care include to maximize quality of life and symptom management.Patient is not interested in any treatment for her lung cancer. MOST selections include no feeding tube, IV fluids/antibiotics as indicated, limited additional intervention.  Visit consisted of counseling and education dealing with the complex and emotionally intense issues of symptom management and palliative care in the setting of serious and potentially life-threatening illness.  Love and support from family help patient to cope.   Palliative care team will continue to support patient, patient's family, and medical team.  I spent 20 minutes providing this consultation. More than 50% of the time in this consultation was spent on coordinating communication.  --------------------------------------------------------------------------------------------------------------------------------------------------  3. Symptom management/Plan:  Low back pain: Improved.  Continue use of heating pad and activity as tolerated.  Balance of rest and performance activity. Tylenol 500mg  TID prn pain.  Weakness: Continue daily walks as tolerated.  Ensure adequate oral intake Palliative will continue to monitor for symptom management/decline and make recommendations as needed. Return 2 months or prn. Encouraged to call provider sooner with any concerns.   HISTORY OF PRESENT ILLNESS: Follow-up visit for [facility],  a 80 y.o. female with multiple medical problems including with multiple medical problems including low back pain which is chronic, intermittent; last visit patient reported pain 4 out of 10 on a pain scale, helped when she changes her position and worse when she sits for too long which impairs her independence.  Today she denied pain/discomfort. History of Lung CA s/p radiation; patientcurrentlyrefuses any treatment for the CA, history of Afib, Aortic insufficiency/stenosis- followed by Cardiologist. Patient with history of Alzheimer's Dementia. History obtained from review of EMR, discussion with patient/family. Review and summarization of Epic records shows history from other than patient.  All 10 point systems reviewed and are negative except as documented in history of present illness above  Review and summarization of Epic records shows history from other than patient.   Palliative Care was asked to follow this patient by consultation request of Valerie Perna, NP to help address complex decision making in the context of advance  care planning and goals of care clarification.   CODE STATUS: DNR  PPS: 60%  HOSPICE ELIGIBILITY/DIAGNOSIS: TBD  PAST MEDICAL HISTORY:  Past Medical History:  Diagnosis  Date  . Alzheimer disease [facility])    Per new patient packet- [place]   . Atrial fibrillation ([place]) 2006   [facility]  . Dementia [facility])    Per [facility]- [facility]   . Low oxygen saturation    Per new patient packet- [facility]   . Lung cancer [facility])    Per [facility] patient packet- [facility]   . On home oxygen therapy    [facility]  . Pneumonia    Per new patient packet- [facility]      SOCIAL HX: @SOCX  Patient lives at home with her daughter Valerie [name]  for ongoing care   FAMILY HX:  Family History  Problem Relation Age of Onset  . Alzheimer's disease Mother   . Alzheimer's disease Father     Review lab tests/diagnostics No results for input(s): WBC, HGB, HCT, PLT, MCV in the last 168 hours. No results for input(s): NA, K, CL, CO2, BUN, CREATININE, GLUCOSE in the last 168 hours. Latest GFR by Cockcroft Gault (not valid in AKI or ESRD) CrCl cannot be calculated (Patient's most recent lab result is older than the maximum 21 days allowed.).  ALLERGIES: No Known Allergies    PERTINENT MEDICATIONS:  Outpatient Encounter Medications as of 01/20/2021  Medication Sig  . Albuterol Sulfate (PROAIR RESPICLICK) 557 (90 Base) MCG/ACT AEPB Inhale into the lungs.  Marland Kitchen apixaban (ELIQUIS) 5 MG TABS tablet Take 1 tablet (5 mg total) by mouth 2 (two) times daily.  Marland Kitchen atorvastatin (LIPITOR) 20 MG tablet Take 1 tablet (20 mg total) by mouth daily.  Marland Kitchen azithromycin (ZITHROMAX) 250 MG tablet Take 250 mg by mouth daily. Pt takes 3 times per week  . Cholecalciferol 50 MCG (2000 UT) TABS Take 2,000 Units by mouth daily at 8 pm.   . furosemide (LASIX) 20 MG tablet Take 1 tablet (20 mg total) by mouth daily.  . OXYGEN Inhale 3 L into the lungs at bedtime.   Marland Kitchen PROAIR RESPICLICK 322 (90 Base) MCG/ACT AEPB Inhale  into the lungs.  Marland Kitchen spironolactone (ALDACTONE) 25 MG tablet Take 1 tablet (25 mg total) by mouth daily.  Marland Kitchen triamcinolone cream (KENALOG) 0.1 % Apply 1 application topically 2 (two) times daily.  . vitamin B-12 (CYANOCOBALAMIN) 1000 MCG tablet Take 1,000 mcg by mouth daily.  . [DISCONTINUED] rivaroxaban (XARELTO) 20 MG TABS tablet Take 1 tablet (20 mg total) by mouth daily with supper.   No facility-administered encounter medications on file as of 01/20/2021.    Thank you for the opportunity to participate in the care of Kaleen Rochette Please call our office at [phone] if we can be of additional assistance.  Note: Portions of this note were generated with Lobbyist. Dictation errors may occur despite best attempts at proofreading.  Teodoro Spray, NP

## 2021-01-22 ENCOUNTER — Ambulatory Visit: Payer: Medicare HMO | Admitting: Internal Medicine

## 2021-01-22 DIAGNOSIS — J9611 Chronic respiratory failure with hypoxia: Secondary | ICD-10-CM | POA: Diagnosis not present

## 2021-01-22 DIAGNOSIS — C3490 Malignant neoplasm of unspecified part of unspecified bronchus or lung: Secondary | ICD-10-CM | POA: Diagnosis not present

## 2021-01-31 ENCOUNTER — Other Ambulatory Visit (INDEPENDENT_AMBULATORY_CARE_PROVIDER_SITE_OTHER): Payer: Self-pay | Admitting: Family Medicine

## 2021-01-31 DIAGNOSIS — I110 Hypertensive heart disease with heart failure: Secondary | ICD-10-CM

## 2021-01-31 NOTE — Telephone Encounter (Signed)
Requested medication (s) are due for refill today: yes  Requested medication (s) are on the active medication list: yes  Last refill:  06/23/20  Future visit scheduled: no  Notes to clinic:  overdue lab work   Requested Prescriptions  Pending Prescriptions Disp Refills   atorvastatin (LIPITOR) 20 MG tablet [Pharmacy Med Name: Atorvastatin Calcium 20 MG Oral Tablet] 90 tablet 0    Sig: Take 1 tablet by mouth once daily      Cardiovascular:  Antilipid - Statins Failed - 01/31/2021  1:38 PM      Failed - Total Cholesterol in normal range and within 360 days    Cholesterol  Date Value Ref Range Status  06/25/2019 139 <200 mg/dL Final          Failed - LDL in normal range and within 360 days    LDL Cholesterol (Calc)  Date Value Ref Range Status  06/25/2019 55 mg/dL (calc) Final    Comment:    Reference range: <100 . Desirable range <100 mg/dL for primary prevention;   <70 mg/dL for patients with CHD or diabetic patients  with > or = 2 CHD risk factors. Marland Kitchen LDL-C is now calculated using the Martin-Hopkins  calculation, which is a validated novel method providing  better accuracy than the Friedewald equation in the  estimation of LDL-C.  Cresenciano Genre et al. Annamaria Helling. 6067;703(40): 2061-2068  (http://education.[employer].com/faq/FAQ164)           Failed - HDL in normal range and within 360 days    HDL  Date Value Ref Range Status  06/25/2019 70 > OR = 50 mg/dL Final          Failed - Triglycerides in normal range and within 360 days    Triglycerides  Date Value Ref Range Status  06/25/2019 49 <150 mg/dL Final          Passed - Patient is not pregnant      Passed - Valid encounter within last 12 months    Recent Outpatient Visits           7 months ago Atrial fibrillation, unspecified type ([place])   [facility] Charlott Rakes, MD   1 year ago Encounter for medical examination to establish care   [place] [name], NP

## 2021-02-15 IMAGING — CT CT CHEST W/ CM
2 of 3 series · 15 of 36 positions shown, 18 images · IV contrast (omnipaque)
Comparison: None.

CLINICAL DATA: Shortness of breath and fatigue for 1 year. History
of lung cancer 5-6 years ago treated with radiation therapy per
patient.

EXAM:
CT CHEST WITH CONTRAST
TECHNIQUE: Multidetector CT imaging of the chest was performed during
intravenous contrast administration.
CONTRAST:  75mL OMNIPAQUE IOHEXOL 300 MG/ML  SOLN

[Series 2: axial st · axial · 0.64mm/px · z∈[-618,-366]mm · 12 of 148 slices shown, 15 images]
[im 11/148  mediastinal]
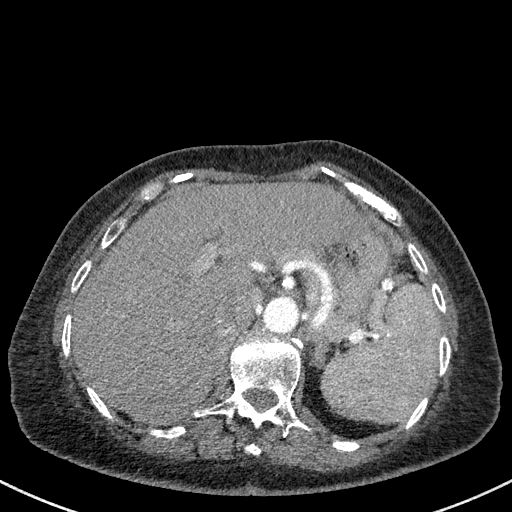
[im 11/148  lung]
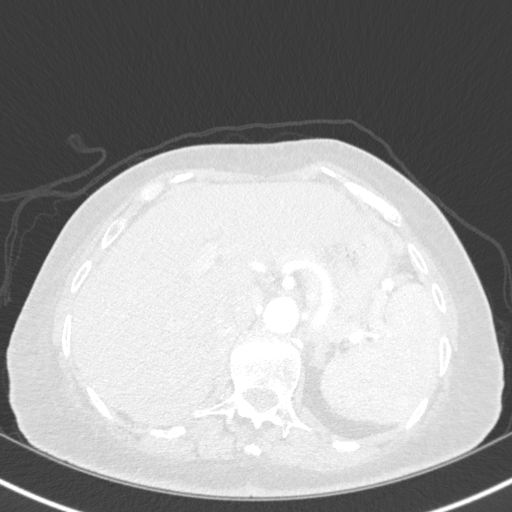
[im 22/148  lung]
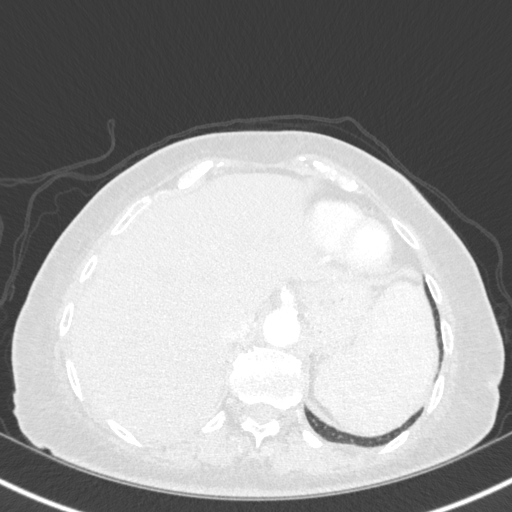
[im 33/148  lung]
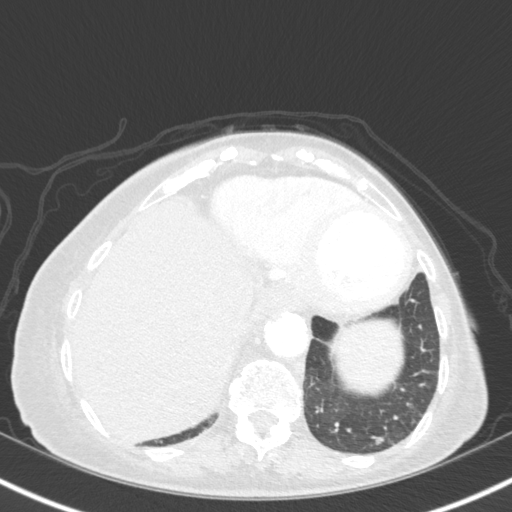
[im 44/148  lung]
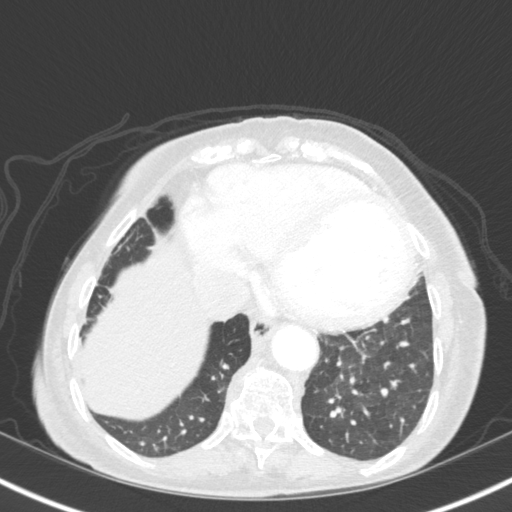
[im 55/148  mediastinal]
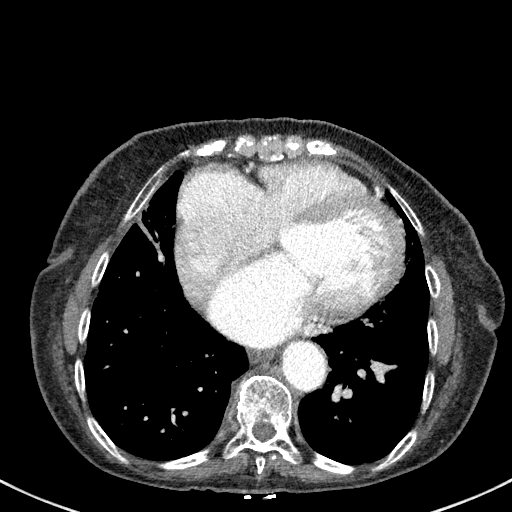
[im 55/148  lung]
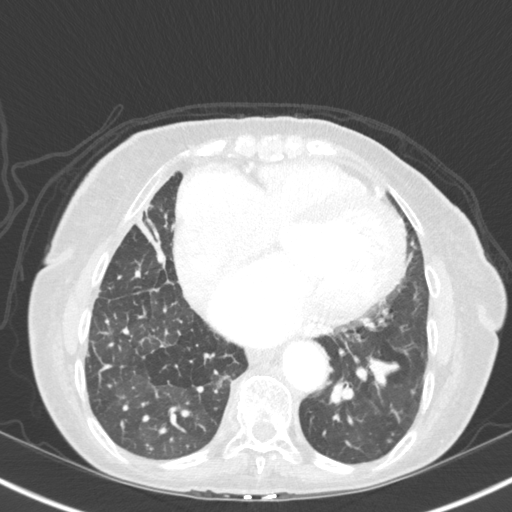
[im 66/148  lung]
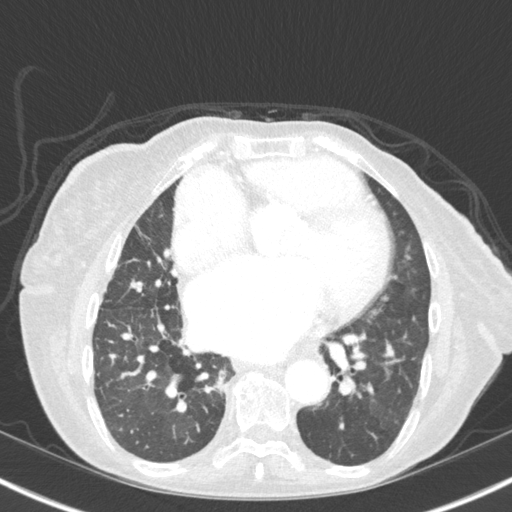
[im 82/148  lung]
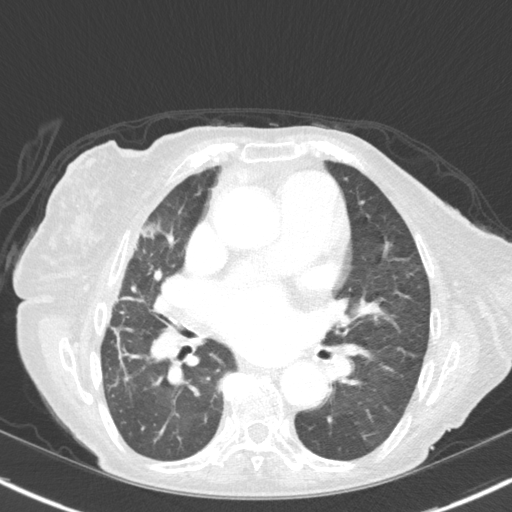
[im 93/148  lung]
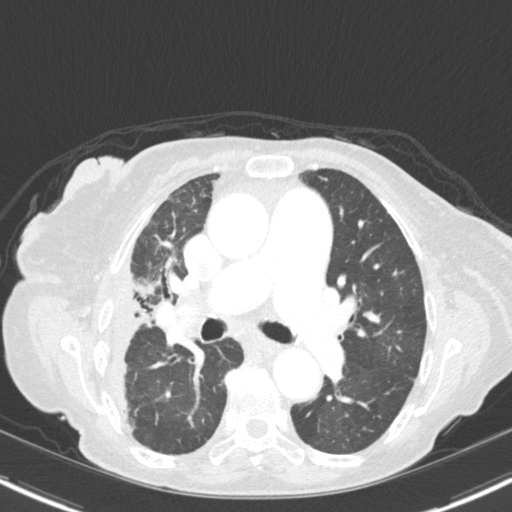
[im 104/148  mediastinal]
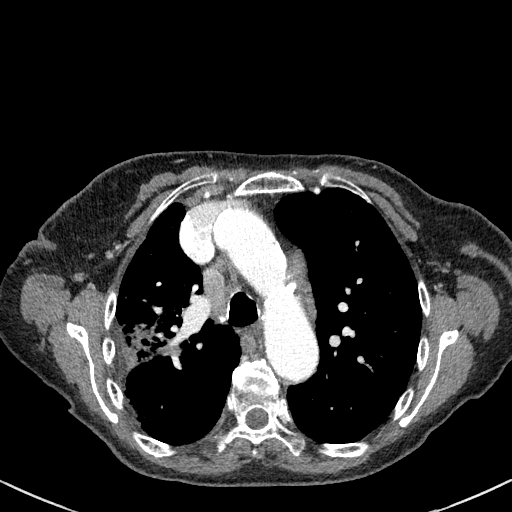
[im 104/148  lung]
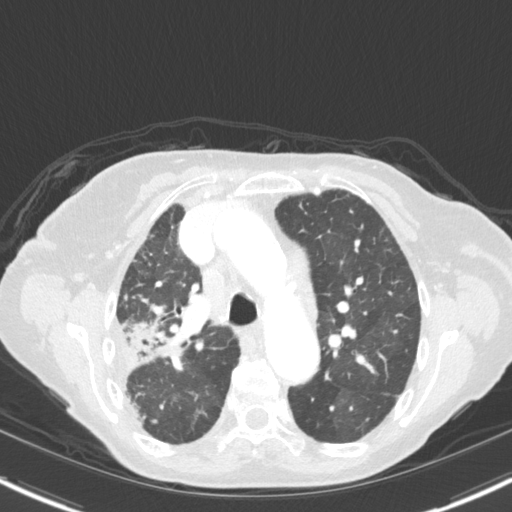
[im 115/148  lung]
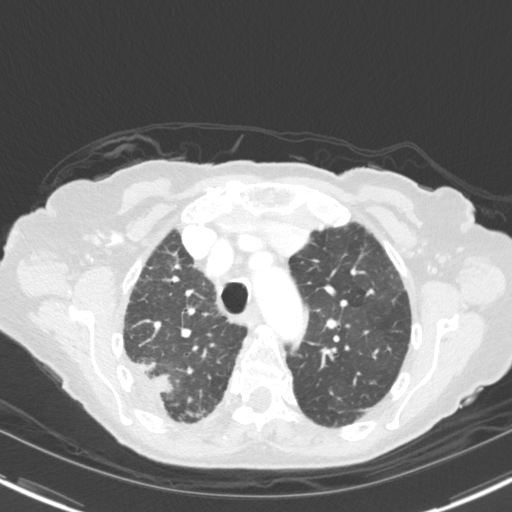
[im 126/148  lung]
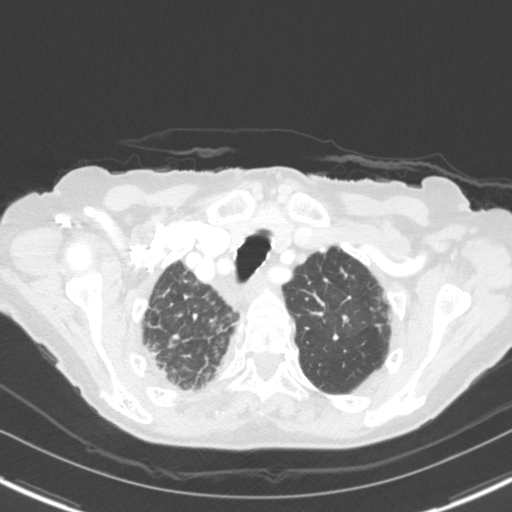
[im 137/148  lung]
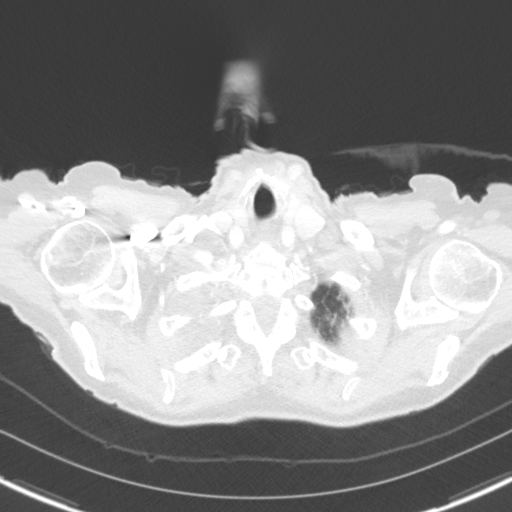

[Series 5: coronal · coronal · 0.66mm/px · 3 of 124 slices shown]
[im 25/124  lung]
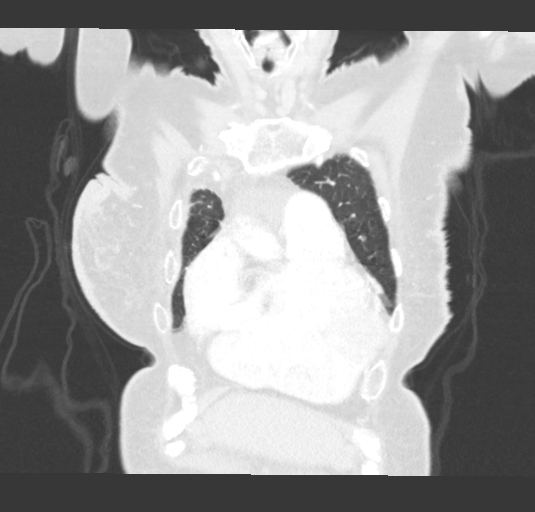
[im 50/124  lung]
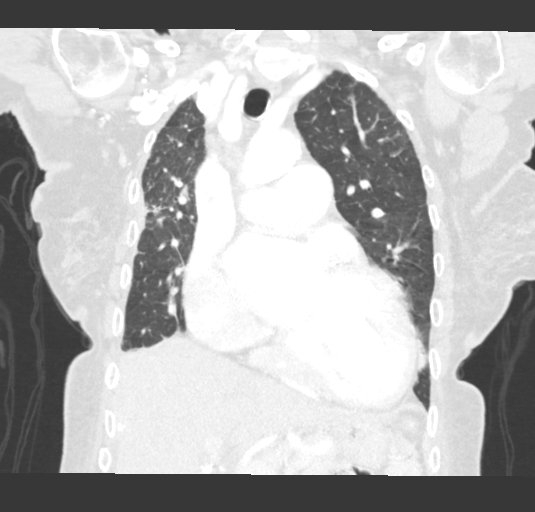
[im 74/124  lung]
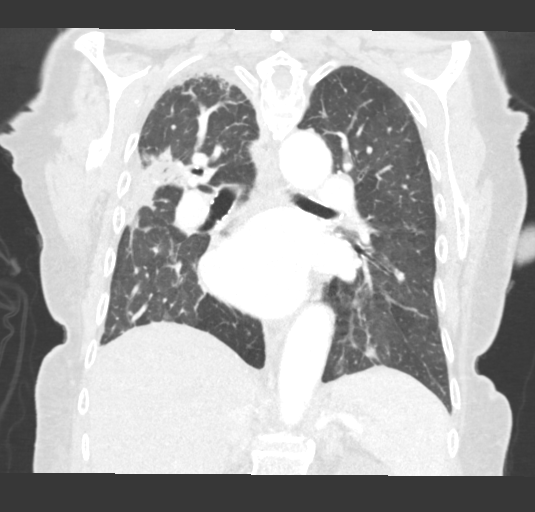

[15 of 36 positions shown; findings below may reference images not displayed]

FINDINGS: Cardiovascular: Atherosclerosis of the aorta, great vessels and
coronary arteries. There is central enlargement of the pulmonary
arteries consistent with pulmonary arterial hypertension. No
evidence of pulmonary embolism. There are calcifications of the
aortic valve. The heart is enlarged. No significant pericardial
effusion.

Mediastinum/Nodes: There is a single mildly enlarged high right
paratracheal node measuring 14 mm short axis on image [DATE]. No other
enlarged mediastinal or hilar lymph nodes. The thyroid gland,
trachea and esophagus demonstrate no significant findings.

Lungs/Pleura: No pleural effusion or pneumothorax. Mild to moderate
centrilobular and paraseptal emphysema. There is peripheral
subpleural density, architectural distortion and central airway
thickening in the mid right lung which has a bandlike orientation on
the reformatted images, suggesting sequela of prior radiation
therapy. This has a nodular component in the upper lobe measuring up
to 3.5 x 2.7 cm on image 39/3. Scattered pulmonary nodules are
present in both lungs. These are most numerous in the left lower
lobe, largest measuring 13 mm on image 110/3. The largest right
lower lobe nodule measures 6 mm on image 91/3.

Upper abdomen: No acute or significant findings are seen within the
visualized upper abdomen. There is no adrenal mass. There is a
cm cyst in the upper pole of the left kidney, incompletely
visualized.

Musculoskeletal/Chest wall: There are fractures of the right 4th and
5th ribs laterally adjacent to the subpleural parenchymal opacities
described above, suspicious for pathologic fractures related to
prior radiation therapy. No osseous metastases identified.
IMPRESSION: 1. Findings in the right hemithorax suspicious for previous
radiation therapy with subpleural density, architectural distortion
and probable pathologic fractures of the right 4th and 5th ribs.
2. Indeterminate pulmonary nodularity bilaterally, most conspicuous
in the left lower lobe. Findings are suspicious for possible
metastatic disease.
3. Single mildly enlarged high right paratracheal lymph node, also
potentially a metastasis.
4. Correlation with prior thoracic imaging recommended. If
unavailable, recommend either short-term (3 month) CT follow-up or
PET-CT for further evaluation.
5. Additional incidental findings including cardiomegaly, aortic and
coronary artery atherosclerosis, central enlargement of the
pulmonary arteries consistent with pulmonary arterial hypertension
and underlying emphysema.
6. These results will be called to the ordering clinician or
representative by the Radiologist Assistant, and communication
documented in the PACS or zVision Dashboard.

## 2021-02-18 ENCOUNTER — Encounter (HOSPITAL_COMMUNITY): Payer: Self-pay | Admitting: Emergency Medicine

## 2021-02-18 ENCOUNTER — Emergency Department (HOSPITAL_COMMUNITY): Payer: Medicare HMO

## 2021-02-18 ENCOUNTER — Emergency Department (HOSPITAL_COMMUNITY)
Admission: EM | Admit: 2021-02-18 | Discharge: 2021-02-18 | Disposition: A | Discharge status: Home / Self Care | Payer: Medicare HMO | Attending: Emergency Medicine | Admitting: Emergency Medicine

## 2021-02-18 ENCOUNTER — Other Ambulatory Visit: Payer: Self-pay

## 2021-02-18 DIAGNOSIS — Z7901 Long term (current) use of anticoagulants: Secondary | ICD-10-CM | POA: Diagnosis not present

## 2021-02-18 DIAGNOSIS — H6121 Impacted cerumen, right ear: Secondary | ICD-10-CM | POA: Diagnosis not present

## 2021-02-18 DIAGNOSIS — R9402 Abnormal brain scan: Secondary | ICD-10-CM | POA: Diagnosis not present

## 2021-02-18 DIAGNOSIS — D329 Benign neoplasm of meninges, unspecified: Secondary | ICD-10-CM

## 2021-02-18 DIAGNOSIS — I509 Heart failure, unspecified: Secondary | ICD-10-CM | POA: Diagnosis not present

## 2021-02-18 DIAGNOSIS — G309 Alzheimer's disease, unspecified: Secondary | ICD-10-CM | POA: Diagnosis not present

## 2021-02-18 DIAGNOSIS — I4891 Unspecified atrial fibrillation: Secondary | ICD-10-CM | POA: Diagnosis not present

## 2021-02-18 DIAGNOSIS — I639 Cerebral infarction, unspecified: Secondary | ICD-10-CM | POA: Diagnosis not present

## 2021-02-18 DIAGNOSIS — R42 Dizziness and giddiness: Secondary | ICD-10-CM | POA: Diagnosis not present

## 2021-02-18 DIAGNOSIS — R001 Bradycardia, unspecified: Secondary | ICD-10-CM | POA: Diagnosis not present

## 2021-02-18 DIAGNOSIS — Z8673 Personal history of transient ischemic attack (TIA), and cerebral infarction without residual deficits: Secondary | ICD-10-CM

## 2021-02-18 DIAGNOSIS — C3490 Malignant neoplasm of unspecified part of unspecified bronchus or lung: Secondary | ICD-10-CM | POA: Diagnosis not present

## 2021-02-18 DIAGNOSIS — G935 Compression of brain: Secondary | ICD-10-CM

## 2021-02-18 DIAGNOSIS — G9389 Other specified disorders of brain: Secondary | ICD-10-CM | POA: Diagnosis not present

## 2021-02-18 LAB — BASIC METABOLIC PANEL
Anion gap: 5 (ref 5–15)
BUN: 16 mg/dL (ref 8–23)
CO2: 33 mmol/L — ABNORMAL HIGH (ref 22–32)
Calcium: 9 mg/dL (ref 8.9–10.3)
Chloride: 99 mmol/L (ref 98–111)
Creatinine, Ser: 0.67 mg/dL (ref 0.44–1.00)
GFR, Estimated: >60 mL/min (ref >60)
Glucose, Bld: 87 mg/dL (ref 70–99)
Potassium: 4.7 mmol/L (ref 3.5–5.1)
Sodium: 137 mmol/L (ref 135–145)

## 2021-02-18 LAB — CBG MONITORING, ED: Glucose-Capillary: 75 mg/dL (ref 70–99)

## 2021-02-18 LAB — URINALYSIS, ROUTINE W REFLEX MICROSCOPIC
Bilirubin Urine: NEGATIVE
Glucose, UA: NEGATIVE mg/dL
Hgb urine dipstick: NEGATIVE
Ketones, ur: NEGATIVE mg/dL
Leukocytes,Ua: NEGATIVE
Nitrite: NEGATIVE
Protein, ur: NEGATIVE mg/dL
Specific Gravity, Urine: 1.013 (ref 1.005–1.030)
pH: 9 — ABNORMAL HIGH (ref 5.0–8.0)

## 2021-02-18 LAB — CBC
HCT: 33.9 % — ABNORMAL LOW (ref 36.0–46.0)
Hemoglobin: 10.2 g/dL — ABNORMAL LOW (ref 12.0–15.0)
MCH: 28.7 pg (ref 26.0–34.0)
MCHC: 30.1 g/dL (ref 30.0–36.0)
MCV: 95.5 fL (ref 80.0–100.0)
Platelets: 129 10*3/uL — ABNORMAL LOW (ref 150–400)
RBC: 3.55 MIL/uL — ABNORMAL LOW (ref 3.87–5.11)
RDW: 13.4 % (ref 11.5–15.5)
WBC: 4 10*3/uL (ref 4.0–10.5)
nRBC: 0 % (ref 0.0–0.2)

## 2021-02-18 MED ORDER — GADOBUTROL 1 MMOL/ML IV SOLN
7.5000 mL | Freq: Once | INTRAVENOUS | Status: AC | PRN
  Administered 2021-02-18: 7.5 mL via INTRAVENOUS

## 2021-02-18 MED ORDER — MECLIZINE HCL 25 MG PO TABS: 25.0000 mg | ORAL_TABLET | Freq: Three times a day (TID) | ORAL | 0 refills | Status: DC | PRN

## 2021-02-18 MED ORDER — LORAZEPAM 2 MG/ML IJ SOLN: 1.0000 mg | Freq: Once | INTRAMUSCULAR | Status: DC | PRN

## 2021-02-18 NOTE — ED Notes (Signed)
PT returned from MRI.

## 2021-02-18 NOTE — ED Notes (Signed)
Pt returned from MRI. Pt was unable to finish MRi due to SOB.

## 2021-02-18 NOTE — ED Notes (Signed)
Patient transported to MRI 

## 2021-02-18 NOTE — ED Provider Notes (Signed)
[facility] EMERGENCY DEPARTMENT Provider Note   CSN: [id number] Arrival date & time: 02/18/21  0901     History Chief Complaint  Patient presents with  . Dizziness    Valerie [name] is a 80 y.o. female.  HPI Has had dizziness for 2-3 nights.  Symptoms are most pronounced during the night.  There seems to be a feeling of spinning or movement.  Patient repot at baseline she cannot lie flat because it makes her very short of breath.  She typically stays in a semirecumbent position.  The farther she lays back, the more she is, the more dizzy she feels no associated headache.  No visual changes.  At baseline patient ambulates independently without a walker.  Reportedly she is continue to ambulate with steady gait.  She has history of lung cancer that was in remission and now has recurred.  She is choosing to pursue palliative care for this but her daughter is getting her reestablished with oncology in this area in order to understand the extent of disease and the progression to be anticipated.  She also has severe aortic stenosis.  Patient's daughter reports that she does assist her some in the home but at baseline she is still functioning quite well independently.  She is dressing and undressing herself and going to the bathroom without assistance.    Past Medical History:  Diagnosis Date  . Alzheimer disease [facility])    Per new patient packet- [facility]   . Atrial fibrillation ([place]) 2006   Per Records received from [facility]  . Dementia [facility])    Per new patient packet- [facility]   . Low oxygen saturation    Per new patient packet- [facility]   . Lung cancer [facility])    Per new patient packet- [facility]   . On home oxygen therapy    Per [facility] received from [facility]  . Pneumonia    Per new patient packet- [place]     Patient Active Problem List   Diagnosis Date Noted  . Aortic insufficiency with aortic stenosis 06/21/2019  . Precordial pain 06/21/2019  . Atrial fibrillation ([place]) 06/21/2019   . Hyperlipidemia 06/21/2019    Past Surgical History:  Procedure Laterality Date  . PARTIAL HYSTERECTOMY     Still with ovaries, Per new patient packet- [facility]   . RIGHT/LEFT HEART CATH AND CORONARY ANGIOGRAPHY N/A 06/21/2019   Procedure: RIGHT/LEFT HEART CATH AND CORONARY ANGIOGRAPHY;  Surgeon: [name], [name], MD;  Location: [place] CV LAB;  Service: Cardiovascular;  Laterality: N/A;     OB History   No obstetric history on file.     Family History  Problem Relation Age of Onset  . Alzheimer's disease Mother   . Alzheimer's disease Father     Social History   Tobacco Use  . Smoking status: Never Smoker  . Smokeless tobacco: Never Used  Vaping Use  . Vaping Use: Never used  Substance Use Topics  . Alcohol use: Never  . Drug use: Never    Home Medications Prior to Admission medications   Medication Sig Start Date End Date Taking? Authorizing Provider  Albuterol Sulfate (PROAIR RESPICLICK) 160 (90 Base) MCG/ACT AEPB Inhale 1 puff into the lungs daily as needed (For shortness of breath). 10/06/20 10/06/21 Yes [provider]  apixaban (ELIQUIS) 5 MG TABS tablet Take 1 tablet (5 mg total) by mouth 2 (two) times daily. 11/12/20  Yes [name], MD  atorvastatin (LIPITOR) 20 MG tablet Take 1  tablet by mouth once daily 02/03/21  Yes [name], NP  Cholecalciferol 50 MCG (2000 UT) TABS Take 2,000 Units by mouth daily at 8 pm.    Yes [provider]  furosemide (LASIX) 20 MG tablet Take 1 tablet (20 mg total) by mouth daily. 06/23/20  Yes Newlin, Charlane Ferretti, MD  OXYGEN Inhale 3 L into the lungs at bedtime.    Yes [provider]  spironolactone (ALDACTONE) 25 MG tablet Take 1 tablet (25 mg total) by mouth daily. 01/20/21  Yes [name], MD  vitamin B-12 (CYANOCOBALAMIN) 1000 MCG tablet Take 1,000 mcg by mouth daily.   Yes [provider]  rivaroxaban (XARELTO) 20 MG TABS tablet Take 1 tablet (20 mg total) by mouth daily  with supper. 06/26/20 10/19/20  [name], MD    Allergies    Patient has no known allergies.  Review of Systems   Review of Systems 10 systems reviewed and negative except as per HPI Physical Exam Updated Vital Signs BP 134/61   Pulse (!) 52   Temp 97.8 F (36.6 C) (Oral)   Resp 17   Ht 5\' 6"  (1.676 m)   Wt 73.5 kg   SpO2 93%   BMI 26.15 kg/m   Physical Exam Constitutional:      Comments: Alert and nontoxic.  Mental status clear.  No respiratory distress  HENT:     Head: Normocephalic and atraumatic.     Ears:     Comments: Incomplete cerumen impaction in the right ear canal.  Normal TM and clear canal on the left.    Mouth/Throat:     Mouth: Mucous membranes are moist.     Pharynx: Oropharynx is clear.  Eyes:     Extraocular Movements: Extraocular movements intact.     Conjunctiva/sclera: Conjunctivae normal.     Pupils: Pupils are equal, round, and reactive to light.  Cardiovascular:     Comments: Bradycardia, irregularly irregular, 3\6 systolic ejection murmur Pulmonary:     Comments: No respiratory distress.  Crackles in bilateral lung fields Abdominal:     General: There is no distension.     Palpations: Abdomen is soft.     Tenderness: There is no abdominal tenderness. There is no guarding.  Musculoskeletal:        General: No swelling or tenderness. Normal range of motion.     Right lower leg: No edema.     Left lower leg: No edema.  Skin:    General: Skin is warm and dry.  Neurological:     General: No focal deficit present.     Mental Status: She is oriented to person, place, and time.     Cranial Nerves: No cranial nerve deficit.     Motor: No weakness.     Coordination: Coordination normal.  Psychiatric:        Mood and Affect: Mood normal.     ED Results / Procedures / Treatments   Labs (all labs ordered are listed, but only abnormal results are displayed) Labs Reviewed  BASIC METABOLIC PANEL - Abnormal; Notable for the following  components:      Result Value   CO2 33 (*)    All other components within normal limits  CBC - Abnormal; Notable for the following components:   RBC 3.55 (*)    Hemoglobin 10.2 (*)    HCT 33.9 (*)    Platelets 129 (*)    All other components within normal limits  URINALYSIS, ROUTINE  W REFLEX MICROSCOPIC  CBG MONITORING, ED    EKG EKG Interpretation  Date/Time:  Thursday February 18 2021 09:09:22 EDT Ventricular Rate:  44 PR Interval:    QRS Duration: 102 QT Interval:  462 QTC Calculation: 395 R Axis:   -30 Text Interpretation: Atrial fibrillation with slow ventricular response Left axis deviation Anterior infarct , age undetermined Abnormal ECG no acute ischemic appearance. no old comparsion Confirmed by Charlesetta Shanks [phone]) on 02/18/2021 11:03:48 AM   Radiology CT Head Wo Contrast  Result Date: 02/18/2021 CLINICAL DATA:  Metastatic disease evaluation. Dizziness. History of lung cancer. EXAM: CT HEAD WITHOUT CONTRAST TECHNIQUE: Contiguous axial images were obtained from the base of the skull through the vertex without intravenous contrast. COMPARISON:  None. FINDINGS: Brain: Ventricle size and cerebral volume normal. Small hypodensity in the right head of caudate most likely chronic infarct. Few small subcortical white matter hyperintensities, nonspecific in appearance. Negative for acute cortical infarct or hemorrhage. Empty sella, likely an incidental finding. 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 Vascular: Negative for hyperdense vessel. Skull: Negative Sinuses/Orbits: Paranasal sinuses clear. Bilateral cataract extraction Other: None IMPRESSION: Scattered small subcortical white matter hyperintensities bilaterally. These are nonspecific and could be seen with chronic ischemia however metastatic disease could present this way. Recommend MRI head without and with contrast. Chiari malformation. Electronically Signed   By:  Franchot Gallo M.D.   On: 02/18/2021 13:48    Procedures Procedures   Medications Ordered in ED Medications  LORazepam (ATIVAN) injection 1 mg (has no administration in time range)    ED Course  I have reviewed the triage vital signs and the nursing notes.  Pertinent labs & imaging results that were available during my care of the patient were reviewed by me and considered in my medical decision making (see chart for details).    MDM Rules/Calculators/A&P                           Patient presents with a quality of dizziness that is new over the past 2 days.  Mental status is alert.  At baseline she does have good independent function for ambulating and basic self-care.  Patient recently diagnosed with recurrence of lung cancer.  CT head obtained for neurologic symptoms in setting of active lung cancer.  Abnormalities identified by radiology with suggestion for MRI with and with out contrast.  I have reviewed this with the patient and her daughter, they do wish to proceed with MRIs.  If findings on MRI chronic or stable, I anticipate the patient will be appropriate for discharge.  Her mental status is clear.  She is still ambulatory.  Final disposition pending MRI results Final Clinical Impression(s) / ED Diagnoses Final diagnoses:  Vertigo  Malignant neoplasm of lung, unspecified laterality, unspecified part of lung ([place])  Abnormal brain CT    Rx / DC Orders ED Discharge Orders    None       Charlesetta Shanks, MD 02/18/21 1450

## 2021-02-18 NOTE — ED Notes (Signed)
Pt ambulated to bathroom 

## 2021-02-18 NOTE — ED Notes (Signed)
Patient transported to CT via stretcher in stable condition 

## 2021-02-18 NOTE — ED Provider Notes (Signed)
  Physical Exam  BP (!) 143/55   Pulse (!) 37   Temp 97.8 F (36.6 C) (Oral)   Resp 20   Ht 5\' 6"  (1.676 m)   Wt 73.5 kg   SpO2 93%   BMI 26.15 kg/m   Physical Exam  ED Course/Procedures     Procedures  MDM    80 year old female with history of longstanding persistent atrial fibrillation with slow ventricular response, anticoagulated with rivaroxaban, congestive heart failure, history of lung cancer post radiation therapy, dementia, pulmonary hypertension who presents with concern for dizziness.  Reports chronic orthopnea, use of O2, when given O2 able to lay flat for MRI.   Has had chronic atrial fibrillation with slow response and do not feel this is contributing to dizziness today which worsens with movements such as laying down.  CT head with question of abnormality.  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 Given rx for meclizine and recommend PCP follow up.      Gareth Morgan, MD 02/19/21 [phone]

## 2021-02-18 NOTE — ED Notes (Signed)
Pt in CT.

## 2021-02-18 NOTE — ED Notes (Signed)
Pt still in MRI, needed replacement IV because first infiltrated when contrast was attempted.

## 2021-02-18 NOTE — ED Triage Notes (Signed)
Pt states she has felt dizzy for two days. Denies any cp and vomiting.

## 2021-02-21 DIAGNOSIS — C3490 Malignant neoplasm of unspecified part of unspecified bronchus or lung: Secondary | ICD-10-CM | POA: Diagnosis not present

## 2021-02-21 DIAGNOSIS — J9611 Chronic respiratory failure with hypoxia: Secondary | ICD-10-CM | POA: Diagnosis not present

## 2021-02-23 ENCOUNTER — Other Ambulatory Visit (INDEPENDENT_AMBULATORY_CARE_PROVIDER_SITE_OTHER): Payer: Self-pay | Admitting: Family Medicine

## 2021-02-23 DIAGNOSIS — I110 Hypertensive heart disease with heart failure: Secondary | ICD-10-CM

## 2021-02-23 NOTE — Telephone Encounter (Signed)
Requested Prescriptions  Pending Prescriptions Disp Refills  . furosemide (LASIX) 20 MG tablet [Pharmacy Med Name: Furosemide 20 MG Oral Tablet] 30 tablet 0    Sig: Take 1 tablet by mouth once daily     Cardiovascular:  Diuretics - Loop Failed - 02/23/2021  5:28 PM      Failed - Valid encounter within last 6 months    Recent Outpatient Visits          8 months ago Atrial fibrillation, unspecified type ([place])   [facility] Charlott Rakes, MD   1 year ago Encounter for medical examination to establish care   [place] [name], NP             Passed - K in normal range and within 360 days    Potassium  Date Value Ref Range Status  02/18/2021 4.7 3.5 - 5.1 mmol/L Final         Passed - Ca in normal range and within 360 days    Calcium  Date Value Ref Range Status  02/18/2021 9.0 8.9 - 10.3 mg/dL Final   Calcium, Ion  Date Value Ref Range Status  06/21/2019 1.23 1.15 - 1.40 mmol/L Final  06/21/2019 1.21 1.15 - 1.40 mmol/L Final         Passed - Na in normal range and within 360 days    Sodium  Date Value Ref Range Status  02/18/2021 137 135 - 145 mmol/L Final  10/13/2020 140 134 - 144 mmol/L Final         Passed - Cr in normal range and within 360 days    Creat  Date Value Ref Range Status  04/01/2019 0.65 0.60 - 0.93 mg/dL Final    Comment:    For patients >79 years of age, the reference limit for Creatinine is approximately 13% higher for people identified as African-American. .    Creatinine, Ser  Date Value Ref Range Status  02/18/2021 0.67 0.44 - 1.00 mg/dL Final         Passed - Last BP in normal range    BP Readings from Last 1 Encounters:  02/18/21 112/79

## 2021-03-18 ENCOUNTER — Other Ambulatory Visit: Payer: Medicare HMO | Admitting: Hospice

## 2021-03-18 ENCOUNTER — Other Ambulatory Visit: Payer: Self-pay

## 2021-03-18 DIAGNOSIS — Z515 Encounter for palliative care: Secondary | ICD-10-CM

## 2021-03-18 DIAGNOSIS — R42 Dizziness and giddiness: Secondary | ICD-10-CM | POA: Diagnosis not present

## 2021-03-18 DIAGNOSIS — M545 Low back pain, unspecified: Secondary | ICD-10-CM

## 2021-03-18 DIAGNOSIS — C3480 Malignant neoplasm of overlapping sites of unspecified bronchus and lung: Secondary | ICD-10-CM | POA: Diagnosis not present

## 2021-03-18 NOTE — Progress Notes (Signed)
[facility] Consult Note Telephone: [phone]  Fax: [phone]  PATIENT NAME: Valerie [name] DOB: 12/28/1940 MRN: [id number]  PRIMARY CARE PROVIDER:   [name], NP [name], NP [address] [place],  [place] [address]  REFERRING PROVIDER: [name], NP [name], NP [address] [place],  [place] [address]  RESPONSIBLE PARTY:  Self/Valerie [name]    Name Relation Home Work [name], Valerie Daughter   [phone]   [facility] Daughter   [phone]      Visit is to build trust and highlight Palliative Medicine as specialized medical care for people living with serious illness, aimed at facilitating better quality of life through symptoms relief, assisting with advance care planning and complex medical decision making.   RECOMMENDATIONS/PLAN:  Advance Care Planning/Code Status: Patient affirmed she is a DO NOT RESUSCITATE.  Goals of Care:Goals of care include to maximize quality of life and symptom management.Patient is not interested in any treatment for her lung cancer. MOST selections include no feeding tube, IV fluids/antibiotics as indicated, limited additional intervention.  Visit consisted of counseling and education dealing with the complex and emotionally intense issues of symptom management and palliative care in the setting of serious and potentially life-threatening illness.  Love and support from family help patient to cope.  Palliative care team will continue to support patient, patient's family, and medical team.  3. Symptom management/Plan:  Dizziness: Slow position changes. Ensure adequate hydration and nutrition.Falls precautions discussed. Take Meclizine as ordered.  Low back pain: Improved.  Continue use of heating pad and activity as tolerated.  Balance of rest and performance activity. Tylenol 500mg  TID prn pain.  Afib: Continue  Xarelto. Monitor for bleeding, palpitations, syncope Palliative will continue to monitor for symptom management/decline and make recommendations as needed. Return2 months or prn. Encouraged to call provider sooner with any concerns.  CHIEF COMPLAINT: Palliative follow up visit/dizziness HISTORY OF PRESENT ILLNESS: Follow-up visit for Valerie [name],  a 80 y.o. female with multiple medical problems including dizziness- acute, started about a month ago, worse when she is laying down and the bed would spin and this impairs her mobility and quality of life; she said Meclizine is helpful and has reduced the intensity of the dizziness. Patient was seen in the hospital 02/18/2021 for dizziness. 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 HistoryofLung CA s/p radiation; patientcurrentlyrefuses any treatment for the CA, history of Afib, Aortic insufficiency/stenosis- followed by Cardiologist. Patient with history of Alzheimer's Dementia.Historyobtained from review of EMR, discussion with patient/family. Review and summarization of Epic records shows history from other than patient.  All 10 point systems reviewed and are negative except as documented in history of present illness above  Review and summarization of Epic records shows history from other than patient.   Palliative Care was asked to follow this patient o help address complex decision making in the context of advance care planning and goals of care clarification.   PPS: 60%  PHYSICAL EXAM BP 130/70 P47 02 95% RA R  18 General: In no acute distress, appropriately dressed Cardiovascular: regular rate and rhythm; no edema in BLE Pulmonary: no cough, no increased work of breathing, normal respiratory effort Abdomen: soft, non tender, no guarding, positive bowel sounds in all  quadrants GU:  no suprapubic tenderness Eyes:  Normal lids, no discharge ENMT: Moist mucous membranes Musculoskeletal:  weakness, sarcopenia Skin: no rash to visible skin, warm without cyanosis,  Psych: non-anxious affect Neurological: Weakness but otherwise non focal Heme/lymph/immuno: no bruises, no bleeding  PERTINENT MEDICATIONS:  Outpatient Encounter Medications as of 03/18/2021  Medication Sig  . Albuterol Sulfate (PROAIR RESPICLICK) 267 (90 Base) MCG/ACT AEPB Inhale 1 puff into the lungs daily as needed (For shortness of breath).  Marland Kitchen apixaban (ELIQUIS) 5 MG TABS tablet Take 1 tablet (5 mg total) by mouth 2 (two) times daily.  Marland Kitchen atorvastatin (LIPITOR) 20 MG tablet Take 1 tablet by mouth once daily  . Cholecalciferol 50 MCG (2000 UT) TABS Take 2,000 Units by mouth daily at 8 pm.   . furosemide (LASIX) 20 MG tablet Take 1 tablet by mouth once daily  . meclizine (ANTIVERT) 25 MG tablet Take 1 tablet (25 mg total) by mouth 3 (three) times daily as needed for dizziness.  . OXYGEN Inhale 3 L into the lungs at bedtime.   Marland Kitchen spironolactone (ALDACTONE) 25 MG tablet Take 1 tablet (25 mg total) by mouth daily.  . vitamin B-12 (CYANOCOBALAMIN) 1000 MCG tablet Take 1,000 mcg by mouth daily.  . [DISCONTINUED] rivaroxaban (XARELTO) 20 MG TABS tablet Take 1 tablet (20 mg total) by mouth daily with supper.   No facility-administered encounter medications on file as of 03/18/2021.    HOSPICE ELIGIBILITY/DIAGNOSIS: TBD  PAST MEDICAL HISTORY:  Past Medical History:  Diagnosis Date  . Alzheimer disease [facility])    Per new patient packet- [facility]   . Atrial fibrillation ([place]) 2006   Per Records received from [facility]  . Dementia [facility])    Per new patient packet- [facility]   . Low oxygen saturation    Per new patient packet- [facility]   . Lung cancer [facility])    Per new patient packet- [facility]   . On home oxygen therapy    Per Records received from [facility]  . Pneumonia    Per new patient packet- [place]       Review lab tests/diagnostics Results for [name], [name] (MRN [id number]) as of 03/18/2021 11:32  Ref. Range 02/18/2021 15:45  Appearance Latest Ref Range: CLEAR  CLEAR  Bilirubin Urine Latest Ref Range: NEGATIVE  NEGATIVE  Color, Urine Latest Ref Range: YELLOW  YELLOW  Glucose, UA Latest Ref Range: NEGATIVE mg/dL NEGATIVE  Hgb urine dipstick Latest Ref Range: NEGATIVE  NEGATIVE  Ketones, ur Latest Ref Range: NEGATIVE mg/dL NEGATIVE  Leukocytes,Ua Latest Ref Range: NEGATIVE  NEGATIVE  Nitrite Latest Ref Range: NEGATIVE  NEGATIVE  pH Latest Ref Range: 5.0 - 8.0  9.0 (H)  Protein Latest Ref Range: NEGATIVE mg/dL NEGATIVE  Specific Gravity, Urine Latest Ref Range: 1.005 - 1.030  1.013   Results for ROSALYNN, SERGENT (MRN [id number]) as of 03/18/2021 11:32  Ref. Range 02/18/2021 76:73  BASIC METABOLIC PANEL Unknown Rpt (A)  Sodium Latest Ref Range: 135 - 145 mmol/L 137  Potassium Latest Ref Range: 3.5 - 5.1 mmol/L 4.7  Chloride Latest Ref Range: 98 - 111 mmol/L 99  CO2 Latest Ref Range: 22 - 32 mmol/L 33 (H)  Glucose Latest Ref Range: 70 - 99 mg/dL 87  BUN Latest Ref Range: 8 - 23 mg/dL 16  Creatinine Latest Ref Range: 0.44 - 1.00 mg/dL 0.67  Calcium Latest Ref Range: 8.9 - 10.3 mg/dL 9.0  Anion gap Latest Ref Range: 5 - 15  5  GFR, Estimated Latest Ref Range: >60 mL/min >60   ALLERGIES:  No Known Allergies    I spent 60 minutes providing this consultation; this includes time spent with patient/family, chart review and documentation. More than 50% of the time in this consultation was spent on counseling and coordinating communication   Thank you for the opportunity to participate in the care of Jaziya Obarr Please call our office at [phone] if we can be of additional assistance.  Note: Portions of this note were generated with [employer]. Dictation errors may occur despite best attempts at proofreading.  Teodoro Spray, NP

## 2021-03-24 DIAGNOSIS — J9611 Chronic respiratory failure with hypoxia: Secondary | ICD-10-CM | POA: Diagnosis not present

## 2021-03-24 DIAGNOSIS — C3490 Malignant neoplasm of unspecified part of unspecified bronchus or lung: Secondary | ICD-10-CM | POA: Diagnosis not present

## 2021-03-25 ENCOUNTER — Other Ambulatory Visit: Payer: Self-pay

## 2021-03-25 ENCOUNTER — Encounter (INDEPENDENT_AMBULATORY_CARE_PROVIDER_SITE_OTHER): Payer: Self-pay | Admitting: Primary Care

## 2021-03-25 ENCOUNTER — Ambulatory Visit (INDEPENDENT_AMBULATORY_CARE_PROVIDER_SITE_OTHER): Payer: Medicare HMO | Admitting: Primary Care

## 2021-03-25 VITALS — BP 137/54 | HR 51 | Temp 97.3°F | Ht 66.0 in | Wt 144.8 lb

## 2021-03-25 DIAGNOSIS — R42 Dizziness and giddiness: Secondary | ICD-10-CM | POA: Diagnosis not present

## 2021-03-25 DIAGNOSIS — D649 Anemia, unspecified: Secondary | ICD-10-CM | POA: Diagnosis not present

## 2021-03-25 DIAGNOSIS — E860 Dehydration: Secondary | ICD-10-CM | POA: Diagnosis not present

## 2021-03-25 DIAGNOSIS — C3490 Malignant neoplasm of unspecified part of unspecified bronchus or lung: Secondary | ICD-10-CM | POA: Diagnosis not present

## 2021-03-25 NOTE — Progress Notes (Signed)
Established Patient Office Visit  Subjective:  Patient ID: Valerie [name], female    DOB: 11-05-1940  Age: 80 y.o. MRN: [id number]  CC:  Chief Complaint  Patient presents with  . New Patient (Initial Visit)    vertigo    HPI Valerie [name] is a 80 year old Hispanic female Oddie Kuhlmann daughter gave permission to speak on her behalf) presents for DIZZINESS  Feeling dizzy for 90 days. Feels like room spins: yes  Lightheadedness when stands: yes  Palpitations or heart racing: yes  Prior dizziness: no Medications tried: meclizine Taking blood thinners: Eliquis   Symptoms Hearing Loss: no Ear Pain or fullness: no Nausea or vomiting: no Vision difficulty or double vision: no Falls: no Head trauma: no  Weakness in arm or leg: weak all over  Speaking problems:no - language barrier  Headache: no  Past Medical History:  Diagnosis Date  . Alzheimer disease [facility])    Per new patient packet- [facility]   . Atrial fibrillation ([place]) 2006   Per [facility] received from [facility]  . Dementia [facility])    Per new patient packet- [facility]   . Low oxygen saturation    Per new patient packet- [facility]   . Lung cancer [facility])    Per new patient packet- [facility]   . On home oxygen therapy    Per Records received from [facility]  . Pneumonia    Per new patient packet- [facility]     Past Surgical History:  Procedure Laterality Date  . PARTIAL HYSTERECTOMY     Still with ovaries, Per new patient packet- [facility]   . RIGHT/LEFT HEART CATH AND CORONARY ANGIOGRAPHY N/A 06/21/2019   Procedure: RIGHT/LEFT HEART CATH AND CORONARY ANGIOGRAPHY;  Surgeon: [name], [name], MD;  Location: [facility];  Service: Cardiovascular;  Laterality: N/A;    Family History  Problem Relation Age of Onset  . Alzheimer's disease Mother   . Alzheimer's disease Father     Social History   Socioeconomic History  . Marital status: Married    Spouse name: Not on file  . Number of children: Not on file  . Years of education: Not on  file  . Highest education level: Not on file  Occupational History  . Not on file  Tobacco Use  . Smoking status: Never Smoker  . Smokeless tobacco: Never Used  Vaping Use  . Vaping Use: Never used  Substance and Sexual Activity  . Alcohol use: Never  . Drug use: Never  . Sexual activity: Not on file  Other Topics Concern  . Not on file  Social History Narrative   Per [facility] new patient packet abstracted 03/28/2019      Diet: Vegetarian       Caffeine: None      Married, if yes what year: Yes, 1959      Do you live in a house, apartment, assisted living, condo, trailer, ect: House, 2 stories, 5 persons      Pets: Dog      Highest level of education: 6th grade       Current/Past profession: N/A      Exercise:No         Living Will: No   DNR: No   POA/HPOA: No      Functional Status:   Do you have difficulty bathing or dressing yourself? No   Do you have difficulty preparing food or eating? Yes   Do you have difficulty managing your medications? Yes  Do you have difficulty managing your finances? Yes   Do you have difficulty affording your medications? Nes   Social Determinants of Health   Financial Resource Strain: Not on file  Food Insecurity: Not on file  Transportation Needs: Not on file  Physical Activity: Not on file  Stress: Not on file  Social Connections: Not on file  Intimate Partner Violence: Not on file    Outpatient Medications Prior to Visit  Medication Sig Dispense Refill  . Albuterol Sulfate (PROAIR RESPICLICK) 622 (90 Base) MCG/ACT AEPB Inhale 1 puff into the lungs daily as needed (For shortness of breath).    Marland Kitchen apixaban (ELIQUIS) 5 MG TABS tablet Take 1 tablet (5 mg total) by mouth 2 (two) times daily. 60 tablet 5  . atorvastatin (LIPITOR) 20 MG tablet Take 1 tablet by mouth once daily 90 tablet 0  . Cholecalciferol 50 MCG (2000 UT) TABS Take 2,000 Units by mouth daily at 8 pm.     . furosemide (LASIX) 20 MG tablet Take 1 tablet by mouth once  daily 30 tablet 0  . meclizine (ANTIVERT) 25 MG tablet Take 1 tablet (25 mg total) by mouth 3 (three) times daily as needed for dizziness. 30 tablet 0  . OXYGEN Inhale 3 L into the lungs at bedtime.     Marland Kitchen spironolactone (ALDACTONE) 25 MG tablet Take 1 tablet (25 mg total) by mouth daily. 90 tablet 3  . vitamin B-12 (CYANOCOBALAMIN) 1000 MCG tablet Take 1,000 mcg by mouth daily.     No facility-administered medications prior to visit.    No Known Allergies  ROS Review of Systems  Neurological: Positive for dizziness and light-headedness.  All other systems reviewed and are negative.     Objective:    BP (!) 137/54 (BP Location: Right Arm, Patient Position: Sitting, Cuff Size: Large)   Pulse (!) 51   Temp (!) 97.3 F (36.3 C) (Temporal)   Ht 5\' 6"  (1.676 m)   Wt 144 lb 12.8 oz (65.7 kg)   SpO2 96%   BMI 23.37 kg/m  Wt Readings from Last 3 Encounters:  03/25/21 144 lb 12.8 oz (65.7 kg)  02/18/21 162 lb (73.5 kg)  10/13/20 159 lb (72.1 kg)   Physical Exam Constitutional:      Comments:  HENT:     Head: Normocephalic and atraumatic.     Ears:     Comments: Incomplete cerumen impaction in the right ear canal.  Normal TM and clear canal on the left.    Mouth/Throat:     Mouth: Mucous membranes are moist.     Pharynx: Oropharynx is clear.  Eyes:     Extraocular Movements: Extraocular movements intact.     Conjunctiva/sclera: Conjunctivae normal.     Pupils: Pupils are equal, round, and reactive to light.  Cardiovascular:     Comments: Bradycardia, irregularly irregular, 3\6 systolic ejection murmur Pulmonary:     Comments: No respiratory distress.  Crackles in bilateral lung fields Abdominal:     General: There is no distension.     Palpations: Abdomen is soft.     Tenderness: There is no abdominal tenderness. There is no guarding.  Musculoskeletal:        General: No swelling or tenderness. Normal range of motion.     Right lower leg: No edema.     Left lower leg:  No edema.  Skin:    General: Skin is warm and dry.  Neurological:     General: No focal deficit  present.     Mental Status: She is oriented to person, place, and time.     Cranial Nerves: No cranial nerve deficit.     Motor: No weakness.     Coordination: Coordination normal.  Psychiatric:        Mood and Affect: normal Health Maintenance Due  Topic Date Due  . Pneumococcal Vaccine 13-88 Years old (1 of 4 - PCV13) Never done  . MAMMOGRAM  Never done  . Zoster Vaccines- Shingrix (1 of 2) Never done  . DEXA SCAN  Never done  . COVID-19 Vaccine (3 - Pfizer risk 4-dose series) 06/08/2020    There are no preventive care reminders to display for this patient.  Lab Results  Component Value Date   TSH 2.38 04/01/2019   Lab Results  Component Value Date   WBC 4.0 02/18/2021   HGB 10.2 (L) 02/18/2021   HCT 33.9 (L) 02/18/2021   MCV 95.5 02/18/2021   PLT 129 (L) 02/18/2021   Lab Results  Component Value Date   NA 137 02/18/2021   K 4.7 02/18/2021   CO2 33 (H) 02/18/2021   GLUCOSE 87 02/18/2021   BUN 16 02/18/2021   CREATININE 0.67 02/18/2021   BILITOT 0.6 04/01/2019   AST 15 04/01/2019   ALT 8 04/01/2019   PROT 7.4 04/01/2019   CALCIUM 9.0 02/18/2021   ANIONGAP 5 02/18/2021   Lab Results  Component Value Date   CHOL 139 06/25/2019   Lab Results  Component Value Date   HDL 70 06/25/2019   Lab Results  Component Value Date   LDLCALC 55 06/25/2019   Lab Results  Component Value Date   TRIG 49 06/25/2019   Lab Results  Component Value Date   CHOLHDL 2.0 06/25/2019   No results found for: HGBA1C    Assessment & Plan:   Tekeya was seen today for new patient (initial visit).  Diagnoses and all orders for this visit:  Dizziness and giddiness -     Ambulatory referral to Hematology / Oncology  Anemia, unspecified type This can be a contributing factor to dizziness explain RBC carried O2 and hers was low not due to FE deficiency anemia but chronic anemia    Malignant neoplasm of lung, unspecified laterality, unspecified part of lung ([place]) Retrieved from [place] Planning/Code Status:Patient affirmed she is a DO NOT RESUSCITATE. Goals of Care:Goals of care include to maximize quality of life and symptom management.Patient is not interested in any treatment for herlungcancer. MOST selections include nofeeding tube,IV fluids/antibiotics as indicated, limited additional intervention.  Dehydration Poor skin turgor showed daughter  gently pinching a fold of skin between your thumb and forefinger. The skin you select, such as below the clavicle or on the abdomen, sternum, or forearm, should feel resilient, move easily, and quickly return to its original position when released after a few seconds. This may not be as accurate but also mouth mucous dry.    Follow-up: Return if symptoms worsen or fail to improve.    [name], NP

## 2021-03-29 ENCOUNTER — Other Ambulatory Visit (INDEPENDENT_AMBULATORY_CARE_PROVIDER_SITE_OTHER): Payer: Self-pay | Admitting: Family Medicine

## 2021-03-29 DIAGNOSIS — I110 Hypertensive heart disease with heart failure: Secondary | ICD-10-CM

## 2021-03-29 NOTE — Telephone Encounter (Signed)
Refill request for Furosemide last filled 03/17/21 at [facility]; spoke with  Myiram and she states the RX was filled and picked up on 03/17/21; refill declined; the pt is seen by Juluis Mire at [facility]; will route to office for notification of encounter.

## 2021-04-01 ENCOUNTER — Telehealth (INDEPENDENT_AMBULATORY_CARE_PROVIDER_SITE_OTHER): Payer: Self-pay

## 2021-04-01 NOTE — Telephone Encounter (Signed)
Copied from [place] [id number]. Topic: Referral - Question >> Mar 31, 2021 12:51 PM Lennox Solders wrote: Reason for CRM: Romelle Starcher with [facility] is calling and received a referral with dx dizziness and giddiness and she needs clarification

## 2021-04-01 NOTE — Telephone Encounter (Signed)
Left message- pleasing family she is palliative care to make comfortable lung cancer metastasis can be cause of hypoxia causing light headed

## 2021-04-05 ENCOUNTER — Telehealth: Payer: Self-pay | Admitting: *Deleted

## 2021-04-05 NOTE — Telephone Encounter (Signed)
I received messages regarding Valerie [name]. I called and spoke to her HIPAA approved person, her daughter. She has questions and states patient does not want treatment. I listened as she explained.  I asked that she call PCP and Palliative NP to have discussion on how to best take care of her mother.  She states she will call them to get more information.

## 2021-04-18 ENCOUNTER — Other Ambulatory Visit (INDEPENDENT_AMBULATORY_CARE_PROVIDER_SITE_OTHER): Payer: Self-pay | Admitting: Family Medicine

## 2021-04-18 ENCOUNTER — Other Ambulatory Visit (INDEPENDENT_AMBULATORY_CARE_PROVIDER_SITE_OTHER): Payer: Self-pay | Admitting: Primary Care

## 2021-04-18 DIAGNOSIS — I110 Hypertensive heart disease with heart failure: Secondary | ICD-10-CM

## 2021-04-18 NOTE — Telephone Encounter (Signed)
Requested medication (s) are due for refill today: yes  Requested medication (s) are on the active medication list: yes  Last refill:  02/23/21 #30 (courtesy RF)  Future visit scheduled: no Using Spanish interpreter # [phone], LM on VM to call office to schedule appt- call back number provided. Chart indicates Spanish speaking, VM recording is in [place].  Notes to clinic:  needs appt   Requested Prescriptions  Pending Prescriptions Disp Refills   furosemide (LASIX) 20 MG tablet [Pharmacy Med Name: Furosemide 20 MG Oral Tablet] 30 tablet 0    Sig: TAKE 1 TABLET BY MOUTH ONCE DAILY . APPOINTMENT REQUIRED FOR FUTURE REFILLS      Cardiovascular:  Diuretics - Loop Passed - 04/18/2021  5:45 PM      Passed - K in normal range and within 360 days    Potassium  Date Value Ref Range Status  02/18/2021 4.7 3.5 - 5.1 mmol/L Final          Passed - Ca in normal range and within 360 days    Calcium  Date Value Ref Range Status  02/18/2021 9.0 8.9 - 10.3 mg/dL Final   Calcium, Ion  Date Value Ref Range Status  06/21/2019 1.23 1.15 - 1.40 mmol/L Final  06/21/2019 1.21 1.15 - 1.40 mmol/L Final          Passed - Na in normal range and within 360 days    Sodium  Date Value Ref Range Status  02/18/2021 137 135 - 145 mmol/L Final  10/13/2020 140 134 - 144 mmol/L Final          Passed - Cr in normal range and within 360 days    Creat  Date Value Ref Range Status  04/01/2019 0.65 0.60 - 0.93 mg/dL Final    Comment:    For patients >53 years of age, the reference limit for Creatinine is approximately 13% higher for people identified as African-American. .    Creatinine, Ser  Date Value Ref Range Status  02/18/2021 0.67 0.44 - 1.00 mg/dL Final          Passed - Last BP in normal range    BP Readings from Last 1 Encounters:  03/25/21 (!) 137/54          Passed - Valid encounter within last 6 months    Recent Outpatient Visits           3 weeks ago Dizziness and giddiness    [facility] [name], NP   9 months ago Atrial fibrillation, unspecified type ([place])   [place], [place], MD   1 year ago Encounter for medical examination to establish care   [place], [place], NP       Future Appointments             In 4 months [name], MD [place] [facility], [facility]

## 2021-04-18 NOTE — Telephone Encounter (Signed)
last RF 02/03/21 #90

## 2021-04-20 DIAGNOSIS — Z20822 Contact with and (suspected) exposure to covid-19: Secondary | ICD-10-CM | POA: Diagnosis not present

## 2021-04-23 DIAGNOSIS — J9611 Chronic respiratory failure with hypoxia: Secondary | ICD-10-CM | POA: Diagnosis not present

## 2021-04-23 DIAGNOSIS — C3490 Malignant neoplasm of unspecified part of unspecified bronchus or lung: Secondary | ICD-10-CM | POA: Diagnosis not present

## 2021-05-05 ENCOUNTER — Other Ambulatory Visit: Payer: Medicare HMO | Admitting: Hospice

## 2021-05-05 ENCOUNTER — Other Ambulatory Visit: Payer: Self-pay

## 2021-05-05 DIAGNOSIS — E440 Moderate protein-calorie malnutrition: Secondary | ICD-10-CM

## 2021-05-05 DIAGNOSIS — R42 Dizziness and giddiness: Secondary | ICD-10-CM | POA: Diagnosis not present

## 2021-05-05 DIAGNOSIS — M545 Low back pain, unspecified: Secondary | ICD-10-CM | POA: Diagnosis not present

## 2021-05-05 DIAGNOSIS — F0390 Unspecified dementia without behavioral disturbance: Secondary | ICD-10-CM | POA: Diagnosis not present

## 2021-05-05 DIAGNOSIS — Z515 Encounter for palliative care: Secondary | ICD-10-CM | POA: Diagnosis not present

## 2021-05-05 NOTE — Progress Notes (Signed)
[facility] Consult Note Telephone: [phone]  Fax: [phone]  PATIENT NAME: Valerie [name] DOB: [birth year] MRN: [id number]  PRIMARY CARE PROVIDER:   [name], NP [name], NP [address] [place],  [place] [address]  REFERRING PROVIDER: [name], NP [name], NP [address],  [place] [address]  RESPONSIBLE PARTY:  Self/[name] Contact Information     Name Relation Home Work [place], [place] Daughter   [phone]   [facility] Daughter   [phone]      TELEHEALTH VISIT STATEMENT Due to the COVID-19 crisis, this visit was done via telemedicine and it was initiated and consent by this patient and or family. Video-audio (telehealth) contact was unable to be done due to technical barriers from the patient's side.   Visit is to build trust and highlight Palliative Medicine as specialized medical care for people living with serious illness, aimed at facilitating better quality of life through symptoms relief, assisting with advance care planning and complex medical decision making. This is a follow up visit.  RECOMMENDATIONS/PLAN:   Advance Care Planning/Code Status: Patient is a Do Not Resuscitate  Goals of Care: Goals of care include to maximize quality of life and symptom management. Patient is not interested in any treatment for her lung cancer. MOST selections include no feeding tube, IV fluids/antibiotics as indicated, limited additional intervention.  Symptom management/Plan:  Protein caloric malnutrition: Ongoing weight loss despite good appetite.  Likely related to history of lung cancer status post radiation; patient refusing treatment for the CA.  Currently 145.8 Ibs down from 162 Ibs 3 months.  Add Ensure daily and healthy snacks in between meals.  Dizziness: Improved.  Has not needed meclizine in a week.  Continue slow position changes. Ensure adequate  hydration and nutrition. Falls precautions discussed. Take Meclizine as ordered. Low back pain: Improved.  Continue use of heating pad and activity as tolerated.  Balance of rest and performance activity. Tylenol 500mg  TID prn pain.  Afib: Continue Xarelto. Monitor for bleeding, palpitations, syncope Alzheimer dementia: Continue ongoing supportive care. Follow up: Palliative care will continue to follow for complex medical decision making, advance care planning, and clarification of goals. Return 6 weeks or prn. Encouraged to call provider sooner with any concerns.  CHIEF COMPLAINT: Palliative follow up  HISTORY OF PRESENT ILLNESS:  Valerie [name] a 80 y.o. female with multiple medical problems including Dizziness, moderate protein caloric malnutrition Alzheimer dementia, lung cancer status post radiation, aortic insufficiency/stenosis, low back pain.  Patient denies pain/discomfort, no respiratory distress.  History obtained from review of EMR, discussion with primary team, family and/or patient. Records reviewed and summarized above. All 10 point systems reviewed and are negative except as documented in history of present illness above  Review and summarization of Epic records shows history from other than patient.   Palliative Care was asked to follow this patient o help address complex decision making in the context of advance care planning and goals of care clarification.   PERTINENT MEDICATIONS:  Outpatient Encounter Medications as of 05/05/2021  Medication Sig   Albuterol Sulfate (PROAIR RESPICLICK) 389 (90 Base) MCG/ACT AEPB Inhale 1 puff into the lungs daily as needed (For shortness of breath).   apixaban (ELIQUIS) 5 MG TABS tablet Take 1 tablet (5 mg total) by mouth 2 (two) times daily.   atorvastatin (LIPITOR) 20 MG tablet Take 1 tablet by mouth once daily   Cholecalciferol 50 MCG (2000 UT) TABS  Take 2,000 Units by mouth daily at 8 pm.    furosemide (LASIX) 20 MG tablet Take 1 tablet by  mouth once daily   meclizine (ANTIVERT) 25 MG tablet Take 1 tablet (25 mg total) by mouth 3 (three) times daily as needed for dizziness.   OXYGEN Inhale 3 L into the lungs at bedtime.    spironolactone (ALDACTONE) 25 MG tablet Take 1 tablet (25 mg total) by mouth daily.   vitamin B-12 (CYANOCOBALAMIN) 1000 MCG tablet Take 1,000 mcg by mouth daily.   [DISCONTINUED] rivaroxaban (XARELTO) 20 MG TABS tablet Take 1 tablet (20 mg total) by mouth daily with supper.   No facility-administered encounter medications on file as of 05/05/2021.    HOSPICE ELIGIBILITY/DIAGNOSIS: TBD  PAST MEDICAL HISTORY:  Past Medical History:  Diagnosis Date   Alzheimer disease [facility])    Per new patient packet- [place]    Atrial fibrillation ([place]) 2006   Per Records received from [facility])    Per [facility] patient packet- [facility]    Low oxygen saturation    Per new patient packet- [place]    Lung cancer ([place])    Per new patient packet- [facility]    On home oxygen therapy    Per [facility] received from [facility]   Pneumonia    Per new patient packet- [place]: No Known Allergies    I spent  40 minutes providing this consultation; this includes time spent with patient/family, chart review and documentation. More than 50% of the time in this consultation was spent on counseling and coordinating communication   Thank you for the opportunity to participate in the care of Valerie [name] Please call our office at [phone] if we can be of additional assistance.  Note: Portions of this note were generated with Lobbyist. Dictation errors may occur despite best attempts at proofreading.  Teodoro Spray, NP

## 2021-05-12 ENCOUNTER — Other Ambulatory Visit (INDEPENDENT_AMBULATORY_CARE_PROVIDER_SITE_OTHER): Payer: Self-pay | Admitting: Primary Care

## 2021-05-12 ENCOUNTER — Other Ambulatory Visit (INDEPENDENT_AMBULATORY_CARE_PROVIDER_SITE_OTHER): Payer: Self-pay | Admitting: Family Medicine

## 2021-05-12 DIAGNOSIS — I110 Hypertensive heart disease with heart failure: Secondary | ICD-10-CM

## 2021-05-12 NOTE — Telephone Encounter (Signed)
  Notes to clinic:  Patient due for labs  Had appointment on 03/25/2021 Review for refill    Requested Prescriptions  Pending Prescriptions Disp Refills   atorvastatin (LIPITOR) 20 MG tablet [Pharmacy Med Name: Atorvastatin Calcium 20 MG Oral Tablet] 90 tablet 0    Sig: Take 1 tablet by mouth once daily      Cardiovascular:  Antilipid - Statins Failed - 05/12/2021 10:06 AM      Failed - Total Cholesterol in normal range and within 360 days    Cholesterol  Date Value Ref Range Status  06/25/2019 139 <200 mg/dL Final          Failed - LDL in normal range and within 360 days    LDL Cholesterol (Calc)  Date Value Ref Range Status  06/25/2019 55 mg/dL (calc) Final    Comment:    Reference range: <100 . Desirable range <100 mg/dL for primary prevention;   <70 mg/dL for patients with CHD or diabetic patients  with > or = 2 CHD risk factors. Marland Kitchen LDL-C is now calculated using the Martin-Hopkins  calculation, which is a validated novel method providing  better accuracy than the Friedewald equation in the  estimation of LDL-C.  Cresenciano Genre et al. Annamaria Helling. 6440;347(42): 2061-2068  (http://education.[facility].com/faq/FAQ164)           Failed - HDL in normal range and within 360 days    HDL  Date Value Ref Range Status  06/25/2019 70 > OR = 50 mg/dL Final          Failed - Triglycerides in normal range and within 360 days    Triglycerides  Date Value Ref Range Status  06/25/2019 49 <150 mg/dL Final          Passed - Patient is not pregnant      Passed - Valid encounter within last 12 months    Recent Outpatient Visits           1 month ago Dizziness and giddiness   [place] Juluis Mire P, NP   10 months ago Atrial fibrillation, unspecified type ([place])   [place], Enobong, MD   1 year ago Encounter for medical examination to establish care   [place] [name], NP        Future Appointments             In 3 months [name], MD [place] [facility], [facility]

## 2021-05-14 NOTE — Telephone Encounter (Signed)
Pt was following up on her refill request for atorvastatin (LIPITOR) 20 MG tablet

## 2021-05-15 ENCOUNTER — Other Ambulatory Visit: Payer: Self-pay

## 2021-05-15 ENCOUNTER — Emergency Department
Admission: EM | Admit: 2021-05-15 | Discharge: 2021-05-15 | Disposition: A | Discharge status: Home / Self Care | Payer: Medicare HMO | Attending: Emergency Medicine | Admitting: Emergency Medicine

## 2021-05-15 DIAGNOSIS — G309 Alzheimer's disease, unspecified: Secondary | ICD-10-CM | POA: Insufficient documentation

## 2021-05-15 DIAGNOSIS — Z85118 Personal history of other malignant neoplasm of bronchus and lung: Secondary | ICD-10-CM | POA: Diagnosis not present

## 2021-05-15 DIAGNOSIS — I4891 Unspecified atrial fibrillation: Secondary | ICD-10-CM | POA: Diagnosis not present

## 2021-05-15 DIAGNOSIS — L509 Urticaria, unspecified: Secondary | ICD-10-CM | POA: Diagnosis not present

## 2021-05-15 DIAGNOSIS — Z7901 Long term (current) use of anticoagulants: Secondary | ICD-10-CM | POA: Insufficient documentation

## 2021-05-15 DIAGNOSIS — T7840XA Allergy, unspecified, initial encounter: Secondary | ICD-10-CM | POA: Diagnosis not present

## 2021-05-15 DIAGNOSIS — Z79899 Other long term (current) drug therapy: Secondary | ICD-10-CM | POA: Diagnosis not present

## 2021-05-15 DIAGNOSIS — R21 Rash and other nonspecific skin eruption: Secondary | ICD-10-CM | POA: Diagnosis present

## 2021-05-15 DIAGNOSIS — F0390 Unspecified dementia without behavioral disturbance: Secondary | ICD-10-CM | POA: Diagnosis not present

## 2021-05-15 MED ORDER — SODIUM CHLORIDE 0.9 % IV BOLUS
500.0000 mL | Freq: Once | INTRAVENOUS | Status: AC
  Administered 2021-05-15: 500 mL via INTRAVENOUS

## 2021-05-15 MED ORDER — METHYLPREDNISOLONE 4 MG PO TBPK: ORAL_TABLET | ORAL | 0 refills | Status: DC

## 2021-05-15 MED ORDER — METHYLPREDNISOLONE SODIUM SUCC 125 MG IJ SOLR
125.0000 mg | Freq: Once | INTRAMUSCULAR | Status: AC
  Administered 2021-05-15: 125 mg via INTRAVENOUS
  Filled 2021-05-15: qty 2

## 2021-05-15 MED ORDER — DIPHENHYDRAMINE HCL 50 MG/ML IJ SOLN
12.5000 mg | Freq: Once | INTRAMUSCULAR | Status: AC
  Administered 2021-05-15: 12.5 mg via INTRAVENOUS
  Filled 2021-05-15: qty 1

## 2021-05-15 MED ORDER — FAMOTIDINE IN NACL 20-0.9 MG/50ML-% IV SOLN
20.0000 mg | Freq: Once | INTRAVENOUS | Status: AC
  Administered 2021-05-15: 20 mg via INTRAVENOUS
  Filled 2021-05-15: qty 50

## 2021-05-15 NOTE — ED Notes (Signed)
Patient verbalizes understanding of discharge instructions. Opportunity for questioning and answers were provided. Armband removed by staff, pt discharged from ED. Wheeled out to lobby  

## 2021-05-15 NOTE — ED Provider Notes (Signed)
[facility] Emergency Department Provider Note  ____________________________________________   Event Date/Time   First MD Initiated Contact with Patient 05/15/21 0720     (approximate)  I have reviewed the triage vital signs and the nursing notes.   HISTORY  Chief Complaint Rash    HPI Valerie [name] is a 80 y.o. female presents to the emergency department with her daughter.  Patient started taking a over-the-counter stool softener.  She has since broken out in a rash all over her body.  Patient is itching.  No fever or chills.  No other new products or detergents.  Medications are the same.  Past Medical History:  Diagnosis Date   Alzheimer disease [facility])    Per new patient packet- [place]    Atrial fibrillation ([place]) 2006   Per Records received from [facility])    Per new patient packet- [facility]    Low oxygen saturation    Per new patient packet- [place]    Lung cancer [facility])    Per new patient packet- [facility]    On home oxygen therapy    Per Records received from [facility]   Pneumonia    Per new patient packet- [facility]     Patient Active Problem List   Diagnosis Date Noted   Aortic insufficiency with aortic stenosis 06/21/2019   Precordial pain 06/21/2019   Atrial fibrillation ([place]) 06/21/2019   Hyperlipidemia 06/21/2019    Past Surgical History:  Procedure Laterality Date   PARTIAL HYSTERECTOMY     Still with ovaries, Per new patient packet- [facility]    RIGHT/LEFT HEART CATH AND CORONARY ANGIOGRAPHY N/A 06/21/2019   Procedure: RIGHT/LEFT HEART CATH AND CORONARY ANGIOGRAPHY;  Surgeon: [name], [name], MD;  Location: [facility];  Service: Cardiovascular;  Laterality: N/A;    Prior to Admission medications   Medication Sig Start Date End Date Taking? Authorizing Provider  methylPREDNISolone (MEDROL DOSEPAK) 4 MG TBPK tablet Take 6 pills on day one then decrease by 1 pill each day 05/15/21  Yes Sylvana Bonk, [place]en Dolin, PA-C  Albuterol Sulfate  (PROAIR RESPICLICK) 683 (90 Base) MCG/ACT AEPB Inhale 1 puff into the lungs daily as needed (For shortness of breath). 10/06/20 10/06/21  [provider]  apixaban (ELIQUIS) 5 MG TABS tablet Take 1 tablet (5 mg total) by mouth 2 (two) times daily. 11/12/20   [name], MD  atorvastatin (LIPITOR) 20 MG tablet Take 1 tablet by mouth once daily 02/03/21   [name], NP  Cholecalciferol 50 MCG (2000 UT) TABS Take 2,000 Units by mouth daily at 8 pm.     [provider]  furosemide (LASIX) 20 MG tablet TAKE 1 TABLET BY MOUTH ONCE DAILY . APPOINTMENT REQUIRED FOR FUTURE REFILLS 05/12/21   [name], NP  meclizine (ANTIVERT) 25 MG tablet Take 1 tablet (25 mg total) by mouth 3 (three) times daily as needed for dizziness. 02/18/21   Gareth Morgan, MD  OXYGEN Inhale 3 L into the lungs at bedtime.     [provider]  spironolactone (ALDACTONE) 25 MG tablet Take 1 tablet (25 mg total) by mouth daily. 01/20/21   [name], MD  vitamin B-12 (CYANOCOBALAMIN) 1000 MCG tablet Take 1,000 mcg by mouth daily.    [provider]  rivaroxaban (XARELTO) 20 MG TABS tablet Take 1 tablet (20 mg total) by mouth daily with supper. 06/26/20 10/19/20  [name], MD    Allergies Patient has  no known allergies.  Family History  Problem Relation Age of Onset   Alzheimer's disease Mother    Alzheimer's disease Father     Social History Social History   Tobacco Use   Smoking status: Never   Smokeless tobacco: Never  Vaping Use   Vaping Use: Never used  Substance Use Topics   Alcohol use: Never   Drug use: Never    Review of Systems  Constitutional: No fever/chills Eyes: No visual changes. ENT: No sore throat. Respiratory: Denies cough Cardiovascular: Denies chest pain Gastrointestinal: Denies abdominal pain Genitourinary: Negative for dysuria. Musculoskeletal: Negative for back pain. Skin: Positive for rash. Psychiatric: no mood  changes,     ____________________________________________   PHYSICAL EXAM:  VITAL SIGNS: ED Triage Vitals  Enc Vitals Group     BP 05/15/21 0047 (!) 126/92     Pulse Rate 05/15/21 0047 66     Resp 05/15/21 0047 16     Temp 05/15/21 0049 99 F (37.2 C)     Temp Source 05/15/21 0049 Oral     SpO2 05/15/21 0047 93 %     Weight 05/15/21 0053 142 lb (64.4 kg)     Height 05/15/21 0047 5\' 5"  (1.651 m)     Head Circumference --      Peak Flow --      Pain Score --      Pain Loc --      Pain Edu? --      Excl. in [place]? --     Constitutional: Alert and oriented. Well appearing and in no acute distress. Eyes: Conjunctivae are normal.  Head: Atraumatic. Nose: No congestion/rhinnorhea. Mouth/Throat: Mucous membranes are moist.   Neck:  supple no lymphadenopathy noted Cardiovascular: Normal rate, regular rhythm. Heart sounds are normal Respiratory: Normal respiratory effort.  No retractions, lungs c t a  GU: deferred Musculoskeletal: FROM all extremities, warm and well perfused Neurologic:  Normal speech and language.  Skin:  Skin is warm, dry and intact.  Diffuse hives noted on the back and extremities  psychiatric: Mood and affect are normal. Speech and behavior are normal.  ____________________________________________   LABS (all labs ordered are listed, but only abnormal results are displayed)  Labs Reviewed - No data to display ____________________________________________   ____________________________________________  RADIOLOGY    ____________________________________________   PROCEDURES  Procedure(s) performed: No  Procedures    ____________________________________________   INITIAL IMPRESSION / ASSESSMENT AND PLAN / ED COURSE  Pertinent labs & imaging results that were available during my care of the patient were reviewed by me and considered in my medical decision making (see chart for details).   Patient is an 80 year old female presents with a  rash.  See HPI.  Physical exam shows patient appears stable  Exam shows diffuse hives.  We will do a allergic reaction protocol but will decrease the amount of fluids and Benadryl.  Patient was given Benadryl 12.5 mg IV, Solu-Medrol 125 mg IV, Pepcid 20 mg IV, and 500 mL of fluid  Patient feels better after medications.  She is discharged stable condition.  Prescription for a Medrol Dosepak.  Follow-up with her regular doctor if not improving in 2 to 3 days.  Valerie [name] was evaluated in Emergency Department on 05/15/2021 for the symptoms described in the history of present illness. She was evaluated in the context of the global COVID-19 pandemic, which necessitated consideration that the patient might be at risk for infection with the SARS-CoV-2 virus that causes COVID-19. Institutional  protocols and algorithms that pertain to the evaluation of patients at risk for COVID-19 are in a state of rapid change based on information released by regulatory bodies including the CDC and federal and state organizations. These policies and algorithms were followed during the patient's care in the ED.    As part of my medical decision making, I reviewed the following data within the [place] History obtained from family, Nursing notes reviewed and incorporated, Interpreter needed, Old chart reviewed, Notes from prior ED visits, and [place] Controlled Substance Database  ____________________________________________   FINAL CLINICAL IMPRESSION(S) / ED DIAGNOSES  Final diagnoses:  Hives  Allergic reaction, initial encounter      NEW MEDICATIONS STARTED DURING THIS VISIT:  New Prescriptions   METHYLPREDNISOLONE (MEDROL DOSEPAK) 4 MG TBPK TABLET    Take 6 pills on day one then decrease by 1 pill each day     Note:  This document was prepared using Dragon voice recognition software and may include unintentional dictation errors.    Versie Starks, PA-C 05/15/21 3838    Lucrezia Starch, MD 05/15/21 [phone]

## 2021-05-15 NOTE — ED Triage Notes (Signed)
Pt presents with confluent red rash noted chest, back, arms. Pt states is itchy. Rashn oted to legs. Pt without airway compromise. Assist of [place] interpreter ana # [phone].

## 2021-05-24 DIAGNOSIS — C3490 Malignant neoplasm of unspecified part of unspecified bronchus or lung: Secondary | ICD-10-CM | POA: Diagnosis not present

## 2021-05-24 DIAGNOSIS — J9611 Chronic respiratory failure with hypoxia: Secondary | ICD-10-CM | POA: Diagnosis not present

## 2021-05-30 ENCOUNTER — Other Ambulatory Visit (INDEPENDENT_AMBULATORY_CARE_PROVIDER_SITE_OTHER): Payer: Self-pay | Admitting: Primary Care

## 2021-05-30 DIAGNOSIS — I110 Hypertensive heart disease with heart failure: Secondary | ICD-10-CM

## 2021-05-30 NOTE — Telephone Encounter (Signed)
last RF 05/12/21 #30

## 2021-06-17 ENCOUNTER — Other Ambulatory Visit: Payer: Self-pay

## 2021-06-17 ENCOUNTER — Other Ambulatory Visit: Payer: Medicare HMO | Admitting: Hospice

## 2021-06-17 DIAGNOSIS — Z515 Encounter for palliative care: Secondary | ICD-10-CM

## 2021-06-17 DIAGNOSIS — F0390 Unspecified dementia without behavioral disturbance: Secondary | ICD-10-CM | POA: Diagnosis not present

## 2021-06-17 DIAGNOSIS — C3480 Malignant neoplasm of overlapping sites of unspecified bronchus and lung: Secondary | ICD-10-CM

## 2021-06-17 DIAGNOSIS — K5901 Slow transit constipation: Secondary | ICD-10-CM | POA: Diagnosis not present

## 2021-06-17 NOTE — Progress Notes (Signed)
[facility] Consult Note Telephone: [phone]  Fax: [phone]  PATIENT NAME: Valerie [name] DOB: [birth year] MRN: [id number]  PRIMARY CARE PROVIDER:   [name], NP [name], NP [address] [place],  [place] [address]  REFERRING PROVIDER: [name], NP [name], NP [address] [place],  [place] [address]  RESPONSIBLE PARTY:  Self/[place]     Name Relation Home Work Olinda, Lorette Daughter     [phone]    [facility] Daughter     [phone]         Visit is to build trust and highlight Palliative Medicine as specialized medical care for people living with serious illness, aimed at facilitating better quality of life through symptoms relief, assisting with advance care planning and complex medical decision making.    RECOMMENDATIONS/PLAN:   Advance Care Planning/Code Status: Patient is a DO NOT RESUSCITATE.   Goals of Care:Goals of care include to maximize quality of life and symptom management. Patient is not interested in any treatment for her lung cancer. MOST selections include no feeding tube, IV fluids/antibiotics as indicated, limited additional intervention. Patient is interested in hospice service in the future. She wants to be present 07/03/21 for her grand daughter's wedding and after that she is ready to go back to her Lord. She will like end of life care in her home.    Visit consisted of counseling and education dealing with the complex and emotionally intense issues of symptom management and palliative care in the setting of serious and potentially life-threatening illness. Darrick Me[place] faith help patient and  family to cope.  Palliative care team will continue to support patient, patient's family, and medical team.   3. Symptom management/Plan:  Lung CA: s/p radiation. Patient refuses further treatment; considering hospice  service.  Continue ongoing supportive care Constipation: Ensure adequate fluid intake, incorporate fruits and vegetables into diet.  Miralax daily as needed constipation. Dementia: worsening memory loss/confusion. FAST 5, FLACC 0. Dizziness: Resolved, no complaint in the last 2 months. Continue Slow position changes. Ensure adequate hydration and nutrition.Falls precautions discussed. Take Meclizine as ordered if needed Weakness: Patient is ambulatory with no assistive device. Encouraged walks in the neighborhood, with her daughter. Both agreed to commence. Other family members to come and take patient outside to socialize and shop.  Hives: Seen in the ED 05/15/2021. She was treated with antibiotics and steroids. Resolved.  Low back pain: Improved.  Continue use of heating pad and activity as tolerated.  Balance of rest and performance activity. Tylenol 500mg  TID prn pain.  Afib: Continue Xarelto. Monitor for bleeding, palpitations, syncope Palliative will continue to monitor for symptom management/decline and make recommendations as needed. Return 2 months or prn. Encouraged to call provider sooner with any concerns.   CHIEF COMPLAINT: Palliative follow up visit  HISTORY OF PRESENT ILLNESS: Follow-up visit for [facility],  a 80 y.o. female with multiple medical problems including lung cancer for which patient refuses further treatment.  History of Afib,  Aortic insufficiency/stenosis- followed by Cardiologist. Patient with history of Alzheimer's Dementia, dizziness, constipation.  Today, patient denies pain/discomfort, endorses constipation.  History obtained from review of EMR, discussion with primary team, family and/or patient. Records reviewed and summarized above. All 10 point systems reviewed and are negative except as documented in history of present illness above  Review and summarization of Epic records  shows history from other than patient.   Palliative Care was asked to follow this patient  o help address complex decision making in the context of advance care planning and goals of care clarification.   PHYSICAL EXAM  General: In no acute distress, appropriately dressed Cardiovascular: regular rate and rhythm; no edema in BLE Pulmonary: no cough, no increased work of breathing, normal respiratory effort on room air Abdomen: soft, non tender, no guarding, positive bowel sounds in all quadrants GU:  no suprapubic tenderness Eyes: Normal lids, no discharge ENMT: Moist mucous membranes Musculoskeletal:  weakness, ambulatory Skin: no rash to visible skin, warm without cyanosis,  Psych: non-anxious affect Neurological: Weakness but otherwise non focal Heme/lymph/immuno: no bruises, no bleeding  PERTINENT MEDICATIONS:  Outpatient Encounter Medications as of 06/17/2021  Medication Sig   Albuterol Sulfate (PROAIR RESPICLICK) 185 (90 Base) MCG/ACT AEPB Inhale 1 puff into the lungs daily as needed (For shortness of breath).   apixaban (ELIQUIS) 5 MG TABS tablet Take 1 tablet (5 mg total) by mouth 2 (two) times daily.   atorvastatin (LIPITOR) 20 MG tablet Take 1 tablet by mouth once daily   Cholecalciferol 50 MCG (2000 UT) TABS Take 2,000 Units by mouth daily at 8 pm.    furosemide (LASIX) 20 MG tablet TAKE 1 TABLET BY MOUTH ONCE DAILY . APPOINTMENT REQUIRED FOR FUTURE REFILLS   meclizine (ANTIVERT) 25 MG tablet Take 1 tablet (25 mg total) by mouth 3 (three) times daily as needed for dizziness.   methylPREDNISolone (MEDROL DOSEPAK) 4 MG TBPK tablet Take 6 pills on day one then decrease by 1 pill each day   OXYGEN Inhale 3 L into the lungs at bedtime.    spironolactone (ALDACTONE) 25 MG tablet Take 1 tablet (25 mg total) by mouth daily.   vitamin B-12 (CYANOCOBALAMIN) 1000 MCG tablet Take 1,000 mcg by mouth daily.   [DISCONTINUED] rivaroxaban (XARELTO) 20 MG TABS tablet Take 1 tablet (20 mg total) by mouth daily with supper.   No facility-administered encounter medications on file as of  06/17/2021.    HOSPICE ELIGIBILITY/DIAGNOSIS: TBD  PAST MEDICAL HISTORY:  Past Medical History:  Diagnosis Date   Alzheimer disease [facility])    Per new patient packet- [name]    Atrial fibrillation ([place]) 2006   Per Records received from [facility])    Per new patient packet- [facility]    Low oxygen saturation    Per new patient packet- [place]    Lung cancer ([place])    Per new patient packet- [place]    On home oxygen therapy    Per Records received from [facility]   Pneumonia    Per new patient packet- [place]      Review lab tests/diagnostics No results for input(s): WBC, HGB, HCT, PLT, MCV in the last 168 hours. No results for input(s): NA, K, CL, CO2, BUN, CREATININE, GLUCOSE in the last 168 hours. CrCl cannot be calculated (Patient's most recent lab result is older than the maximum 21 days allowed.).  ALLERGIES: No Known Allergies    I spent 60 minutes providing this consultation; this includes time spent with patient/family, chart review and documentation. More than 50% of the time in this consultation was spent on counseling and coordinating communication   Thank you for the opportunity to participate in the care of Valerie [name] Please call our office at [phone] if we can be of additional assistance.  Note: Portions of this note were generated with [employer].  Dictation errors may occur despite best attempts at proofreading.  Teodoro Spray, NP

## 2021-06-21 ENCOUNTER — Ambulatory Visit (INDEPENDENT_AMBULATORY_CARE_PROVIDER_SITE_OTHER): Payer: Medicare HMO | Admitting: Primary Care

## 2021-06-22 ENCOUNTER — Other Ambulatory Visit (INDEPENDENT_AMBULATORY_CARE_PROVIDER_SITE_OTHER): Payer: Self-pay | Admitting: Primary Care

## 2021-06-22 DIAGNOSIS — I110 Hypertensive heart disease with heart failure: Secondary | ICD-10-CM

## 2021-06-22 NOTE — Telephone Encounter (Signed)
Requested Prescriptions  Pending Prescriptions Disp Refills  . furosemide (LASIX) 20 MG tablet [Pharmacy Med Name: Furosemide 20 MG Oral Tablet] 30 tablet 0    Sig: TAKE 1 TABLET BY MOUTH ONCE DAILY(APPOINTMENT REQUIRED FOR FUTURE REFILLS)     Cardiovascular:  Diuretics - Loop Passed - 06/22/2021  5:30 AM      Passed - K in normal range and within 360 days    Potassium  Date Value Ref Range Status  02/18/2021 4.7 3.5 - 5.1 mmol/L Final         Passed - Ca in normal range and within 360 days    Calcium  Date Value Ref Range Status  02/18/2021 9.0 8.9 - 10.3 mg/dL Final   Calcium, Ion  Date Value Ref Range Status  06/21/2019 1.23 1.15 - 1.40 mmol/L Final  06/21/2019 1.21 1.15 - 1.40 mmol/L Final         Passed - Na in normal range and within 360 days    Sodium  Date Value Ref Range Status  02/18/2021 137 135 - 145 mmol/L Final  10/13/2020 140 134 - 144 mmol/L Final         Passed - Cr in normal range and within 360 days    Creat  Date Value Ref Range Status  04/01/2019 0.65 0.60 - 0.93 mg/dL Final    Comment:    For patients >46 years of age, the reference limit for Creatinine is approximately 13% higher for people identified as African-American. .    Creatinine, Ser  Date Value Ref Range Status  02/18/2021 0.67 0.44 - 1.00 mg/dL Final         Passed - Last BP in normal range    BP Readings from Last 1 Encounters:  05/15/21 125/79         Passed - Valid encounter within last 6 months    Recent Outpatient Visits          2 months ago Dizziness and giddiness   [facility] [name], NP   12 months ago Atrial fibrillation, unspecified type ([place])   [place], [place], MD   1 year ago Encounter for medical examination to establish care   [place], [place], NP      Future Appointments            In 1 month [name], MD [place] [facility], [facility]

## 2021-06-24 DIAGNOSIS — C3490 Malignant neoplasm of unspecified part of unspecified bronchus or lung: Secondary | ICD-10-CM | POA: Diagnosis not present

## 2021-06-24 DIAGNOSIS — J9611 Chronic respiratory failure with hypoxia: Secondary | ICD-10-CM | POA: Diagnosis not present

## 2021-07-12 ENCOUNTER — Other Ambulatory Visit: Payer: Self-pay

## 2021-07-12 ENCOUNTER — Ambulatory Visit (INDEPENDENT_AMBULATORY_CARE_PROVIDER_SITE_OTHER): Payer: Medicare HMO | Admitting: Primary Care

## 2021-07-12 ENCOUNTER — Encounter (INDEPENDENT_AMBULATORY_CARE_PROVIDER_SITE_OTHER): Payer: Self-pay | Admitting: Primary Care

## 2021-07-12 DIAGNOSIS — Z0000 Encounter for general adult medical examination without abnormal findings: Secondary | ICD-10-CM | POA: Diagnosis not present

## 2021-07-12 NOTE — Progress Notes (Signed)
[facility]   Subjective:   Valerie [name] is a 80 y.o. female who presents for an Initial Medicare Annual Wellness Visit. I connected with  [name] on 07/12/21 by a video enabled telemedicine application and verified that I am speaking with the correct person using two identifiers.   I discussed the limitations of evaluation and management by telemedicine. The patient expressed understanding and agreed to proceed.   Location of patient: Location of provider:  Review of Systems    Review of Systems  HENT:  Positive for hearing loss.   Respiratory:  Positive for cough and shortness of breath.   Psychiatric/Behavioral:  Positive for memory loss.   All other systems reviewed and are negative.       Objective:    There were no vitals filed for this visit. There is no height or weight on file to calculate BMI.  Advanced Directives 02/18/2021 07/15/2019 06/21/2019 04/01/2019  Does Patient Have a Medical Advance Directive? No No No Yes  Type of Advance Directive - - - Out of facility DNR (pink MOST or yellow form)  Does patient want to make changes to medical advance directive? - - - No - Patient declined  Would patient like information on creating a medical advance directive? No - Patient declined Yes (MAU/Ambulatory/Procedural Areas - Information given) No - Patient declined -    Current Medications (verified) Outpatient Encounter Medications as of 07/12/2021  Medication Sig   Albuterol Sulfate (PROAIR RESPICLICK) 614 (90 Base) MCG/ACT AEPB Inhale 1 puff into the lungs daily as needed (For shortness of breath).   apixaban (ELIQUIS) 5 MG TABS tablet Take 1 tablet (5 mg total) by mouth 2 (two) times daily.   atorvastatin (LIPITOR) 20 MG tablet Take 1 tablet by mouth once daily   Cholecalciferol 50 MCG (2000 UT) TABS Take 2,000 Units by mouth daily at 8 pm.    furosemide (LASIX) 20 MG tablet TAKE 1 TABLET BY MOUTH ONCE DAILY(APPOINTMENT REQUIRED FOR FUTURE REFILLS)    meclizine (ANTIVERT) 25 MG tablet Take 1 tablet (25 mg total) by mouth 3 (three) times daily as needed for dizziness.   methylPREDNISolone (MEDROL DOSEPAK) 4 MG TBPK tablet Take 6 pills on day one then decrease by 1 pill each day   OXYGEN Inhale 3 L into the lungs at bedtime.    spironolactone (ALDACTONE) 25 MG tablet Take 1 tablet (25 mg total) by mouth daily.   vitamin B-12 (CYANOCOBALAMIN) 1000 MCG tablet Take 1,000 mcg by mouth daily.   [DISCONTINUED] rivaroxaban (XARELTO) 20 MG TABS tablet Take 1 tablet (20 mg total) by mouth daily with supper.   No facility-administered encounter medications on file as of 07/12/2021.    Allergies (verified) Patient has no known allergies.   History: Past Medical History:  Diagnosis Date   Alzheimer disease [facility])    Per new patient packet- [place]    Atrial fibrillation ([place]) 2006   Per Records received from [facility])    Per [facility] packet- [facility]    Low oxygen saturation    Per new patient packet- [place]    Lung cancer [facility])    Per new patient packet- [place]    On home oxygen therapy    Per Records received from [facility]   Pneumonia    Per new patient packet- [place]    Past Surgical History:  Procedure Laterality Date   PARTIAL HYSTERECTOMY     Still with ovaries, Per new patient packet-  [facility]    RIGHT/LEFT HEART CATH AND CORONARY ANGIOGRAPHY N/A 06/21/2019   Procedure: RIGHT/LEFT HEART CATH AND CORONARY ANGIOGRAPHY;  Surgeon: [name], [name], MD;  Location: [facility];  Service: Cardiovascular;  Laterality: N/A;   Family History  Problem Relation Age of Onset   Alzheimer's disease Mother    Alzheimer's disease Father    Social History   Socioeconomic History   Marital status: Married    Spouse name: Not on file   Number of children: Not on file   Years of education: Not on file   Highest education level: Not on file  Occupational History   Not on file  Tobacco Use   Smoking status: Never   Smokeless tobacco:  Never  Vaping Use   Vaping Use: Never used  Substance and Sexual Activity   Alcohol use: Never   Drug use: Never   Sexual activity: Not on file  Other Topics Concern   Not on file  Social History Narrative   Per [place] new patient packet abstracted 03/28/2019      Diet: Vegetarian       Caffeine: None      Married, if yes what year: Yes, 1959      Do you live in a house, apartment, assisted living, condo, trailer, ect: House, 2 stories, 5 persons      Pets: Dog      Highest level of education: 6th grade       Current/Past profession: N/A      Exercise:No         Living Will: No   DNR: No   POA/HPOA: No      Functional Status:   Do you have difficulty bathing or dressing yourself? No   Do you have difficulty preparing food or eating? Yes   Do you have difficulty managing your medications? Yes   Do you have difficulty managing your finances? Yes   Do you have difficulty affording your medications? Nes   Social Determinants of Health   Financial Resource Strain: Low Risk    Difficulty of Paying Living Expenses: Not hard at all  Food Insecurity: No Food Insecurity   Worried About Charity fundraiser in the Last Year: Never true   Arboriculturist in the Last Year: Never true  Transportation Needs: No Transportation Needs   Lack of Transportation (Medical): No   Lack of Transportation (Non-Medical): No  Physical Activity: Inactive   Days of Exercise per Week: 0 days   Minutes of Exercise per Session: 0 min  Stress: No Stress Concern Present   Feeling of Stress : Not at all  Social Connections: Moderately Integrated   Frequency of Communication with Friends and Family: More than three times a week   Frequency of Social Gatherings with Friends and Family: Once a week   Attends Religious Services: More than 4 times per year   Active Member of [employer] or Organizations: No   Attends Archivist Meetings: Never   Marital Status: Married    Tobacco  Counseling Counseling given: Not Answered   Clinical Intake:   Diabetic?no  Activities of Daily Living No flowsheet data found.  Patient Care Team: [name], NP as PCP - General (Internal Medicine) [name], MD as PCP - Cardiology (Cardiology) Papagikos, Venia Carbon, MD as Referring Physician (Radiation Oncology)  Indicate any recent Medical Services you may have received from other than [facility] providers in the past year (date may  be approximate).     Assessment:   This is a routine wellness examination for [place].  Hearing/Vision screen No results found.  Dietary issues and exercise activities discussed:     Goals Addressed   None   Depression Screen PHQ 2/9 Scores 07/12/2021 03/25/2021 06/23/2020 12/24/2019 07/15/2019 04/01/2019  PHQ - 2 Score 0 0 0 0 0 0  PHQ- 9 Score - - 1 - - -    Fall Risk Fall Risk  03/25/2021 03/25/2021 06/23/2020 12/24/2019 07/15/2019  Falls in the past year? 0 0 0 0 0  Number falls in past yr: - - - - 0  Injury with Fall? - - 0 - 0    FALL RISK PREVENTION PERTAINING TO THE HOME:  Any stairs in or around the home? Yes  If so, are there any without handrails? No  Home free of loose throw rugs in walkways, pet beds, electrical cords, etc? No  Adequate lighting in your home to reduce risk of falls? Yes   ASSISTIVE DEVICES UTILIZED TO PREVENT FALLS:  Life alert? No  Use of a cane, walker or w/c? No  Grab bars in the bathroom? No  Shower chair or bench in shower? No Elevated toilet seat or a handicapped toilet? No   TIMED UP AND GO:  Was the test performed? No .    Gait slow and steady without use of assistive device  Cognitive Function: decline   Immunizations Immunization History  Administered Date(s) Administered   Fluad Quad(high Dose 65+) 07/15/2019   Influenza,inj,Quad PF,6+ Mos 06/23/2020   PFIZER(Purple Top)SARS-COV-2 Vaccination 04/20/2020, 05/11/2020   Pneumococcal Conjugate-13 10/25/2015   Pneumococcal  Polysaccharide-23 07/15/2019    TDAP status: Due, Education has been provided regarding the importance of this vaccine. Advised may receive this vaccine at local pharmacy or Health Dept. Aware to provide a copy of the vaccination record if obtained from local pharmacy or Health Dept. Verbalized acceptance and understanding.  Flu Vaccine status: Due, Education has been provided regarding the importance of this vaccine. Advised may receive this vaccine at local pharmacy or Health Dept. Aware to provide a copy of the vaccination record if obtained from local pharmacy or Health Dept. Verbalized acceptance and understanding.  Pneumococcal vaccine status: Declined,  Education has been provided regarding the importance of this vaccine but patient still declined. Advised may receive this vaccine at local pharmacy or Health Dept. Aware to provide a copy of the vaccination record if obtained from local pharmacy or Health Dept. Verbalized acceptance and understanding.   Covid-19 vaccine status: Completed vaccines  Qualifies for Shingles Vaccine? Yes   Zostavax completed No   Shingrix Completed?: No.    Education has been provided regarding the importance of this vaccine. Patient has been advised to call insurance company to determine out of pocket expense if they have not yet received this vaccine. Advised may also receive vaccine at local pharmacy or Health Dept. Verbalized acceptance and understanding.  Screening Tests Health Maintenance  Topic Date Due   MAMMOGRAM  Never done   Zoster Vaccines- Shingrix (1 of 2) Never done   DEXA SCAN  Never done   COVID-19 Vaccine (3 - Pfizer risk series) 06/08/2020   INFLUENZA VACCINE  05/24/2021   TETANUS/TDAP  03/25/2022 (Originally 09/12/1960)   HPV VACCINES  Aged Out    Health Maintenance  Health Maintenance Due  Topic Date Due   MAMMOGRAM  Never done   Zoster Vaccines- Shingrix (1 of 2) Never done   DEXA SCAN  Never done   COVID-19 Vaccine (3 -  Pfizer risk series) 06/08/2020   INFLUENZA VACCINE  05/24/2021    Colorectal cancer screening: No longer required.   Mammogram status: No longer required due to age.  Bone Density status: Ordered 07/12/2021. Pt provided with contact info and advised to call to schedule appt.  Lung Cancer Screening: (Low Dose CT Chest recommended if Age 17-80 years, 30 pack-year currently smoking OR have quit w/in 15years.) does not qualify.   Lung Cancer Screening Referral: n/a  Additional Screening:  Hepatitis C Screening: does not qualify  Vision Screening: Recommended annual ophthalmology exams for early detection of glaucoma and other disorders of the eye. Is the patient up to date with their annual eye exam?  No  Who is the provider or what is the name of the office in which the patient attends annual eye exams? Decline  If pt is not established with a provider, would they like to be referred to a provider to establish care? No .   Dental Screening: Recommended annual dental exams for proper oral hygiene  Community Resource Referral / Chronic Care Management: CRR required this visit?  No   CCM required this visit?  No      Plan:    I have personally reviewed and noted the following in the patient's chart:   Medical and social history Use of alcohol, tobacco or illicit drugs  Current medications and supplements including opioid prescriptions. Patient is not currently taking opioid prescriptions. Functional ability and status Nutritional status Physical activity Advanced directives List of other physicians Hospitalizations, surgeries, and ER visits in previous 12 months Vitals Screenings to include cognitive, depression, and falls Referrals and appointments  In addition, I have reviewed and discussed with patient certain preventive protocols, quality metrics, and best practice recommendations. A written personalized care plan for preventive services as well as general preventive  health recommendations were provided to patient.     [name], NP   07/12/2021   Nurse Notes: non face to face 60 minute visit   Ms. Lundy , Thank you for taking time to come for your Medicare Wellness Visit. I appreciate your ongoing commitment to your health goals. Please review the following plan we discussed and let me know if I can assist you in the future.   These are the goals we discussed:  Goals   None     This is a list of the screening recommended for you and due dates:  Health Maintenance  Topic Date Due   Mammogram  Never done   Zoster (Shingles) Vaccine (1 of 2) Never done   DEXA scan (bone density measurement)  Never done   COVID-19 Vaccine (3 - Pfizer risk series) 06/08/2020   Flu Shot  05/24/2021   Tetanus Vaccine  03/25/2022*   HPV Vaccine  Aged Out  *Topic was postponed. The date shown is not the original due date.

## 2021-07-24 DIAGNOSIS — J9611 Chronic respiratory failure with hypoxia: Secondary | ICD-10-CM | POA: Diagnosis not present

## 2021-07-24 DIAGNOSIS — C3490 Malignant neoplasm of unspecified part of unspecified bronchus or lung: Secondary | ICD-10-CM | POA: Diagnosis not present

## 2021-08-01 ENCOUNTER — Other Ambulatory Visit (INDEPENDENT_AMBULATORY_CARE_PROVIDER_SITE_OTHER): Payer: Self-pay | Admitting: Nurse Practitioner

## 2021-08-01 DIAGNOSIS — I110 Hypertensive heart disease with heart failure: Secondary | ICD-10-CM

## 2021-08-15 ENCOUNTER — Other Ambulatory Visit (INDEPENDENT_AMBULATORY_CARE_PROVIDER_SITE_OTHER): Payer: Self-pay | Admitting: Primary Care

## 2021-08-15 DIAGNOSIS — I110 Hypertensive heart disease with heart failure: Secondary | ICD-10-CM

## 2021-08-15 NOTE — Telephone Encounter (Signed)
Requested medication (s) are due for refill today: yes  Requested medication (s) are on the active medication list: yes  Last refill:  05/17/21 #90  Future visit scheduled: no  Notes to clinic:  overdue lab work-   Requested Prescriptions  Pending Prescriptions Disp Refills   atorvastatin (LIPITOR) 20 MG tablet [Pharmacy Med Name: Atorvastatin Calcium 20 MG Oral Tablet] 90 tablet 0    Sig: Take 1 tablet by mouth once daily     Cardiovascular:  Antilipid - Statins Failed - 08/15/2021  6:30 AM      Failed - Total Cholesterol in normal range and within 360 days    Cholesterol  Date Value Ref Range Status  06/25/2019 139 <200 mg/dL Final          Failed - LDL in normal range and within 360 days    LDL Cholesterol (Calc)  Date Value Ref Range Status  06/25/2019 55 mg/dL (calc) Final    Comment:    Reference range: <100 . Desirable range <100 mg/dL for primary prevention;   <70 mg/dL for patients with CHD or diabetic patients  with > or = 2 CHD risk factors. Marland Kitchen LDL-C is now calculated using the Martin-Hopkins  calculation, which is a validated novel method providing  better accuracy than the Friedewald equation in the  estimation of LDL-C.  Cresenciano Genre et al. Annamaria Helling. 6568;127(51): 2061-2068  (http://education.[facility].com/faq/FAQ164)           Failed - HDL in normal range and within 360 days    HDL  Date Value Ref Range Status  06/25/2019 70 > OR = 50 mg/dL Final          Failed - Triglycerides in normal range and within 360 days    Triglycerides  Date Value Ref Range Status  06/25/2019 49 <150 mg/dL Final          Passed - Patient is not pregnant      Passed - Valid encounter within last 12 months    Recent Outpatient Visits           1 month ago Encounter for Medicare annual wellness exam   [place], Michelle P, NP   4 months ago Dizziness and giddiness   [facility] [name], NP    1 year ago Atrial fibrillation, unspecified type ([place])   [place], [place], MD   1 year ago Encounter for medical examination to establish care   [place], [place], NP       Future Appointments             In 4 days [name], MD [place] [facility]

## 2021-08-16 NOTE — Telephone Encounter (Signed)
Sent to PCP ?

## 2021-08-19 ENCOUNTER — Ambulatory Visit: Payer: Medicare HMO | Admitting: Internal Medicine

## 2021-08-24 DIAGNOSIS — C3490 Malignant neoplasm of unspecified part of unspecified bronchus or lung: Secondary | ICD-10-CM | POA: Diagnosis not present

## 2021-08-24 DIAGNOSIS — J9611 Chronic respiratory failure with hypoxia: Secondary | ICD-10-CM | POA: Diagnosis not present

## 2021-08-31 ENCOUNTER — Telehealth: Payer: Self-pay | Admitting: Internal Medicine

## 2021-08-31 MED ORDER — SPIRONOLACTONE 25 MG PO TABS: 25.0000 mg | ORAL_TABLET | Freq: Every day | ORAL | 0 refills | Status: DC

## 2021-08-31 NOTE — Telephone Encounter (Signed)
New Message:     Daughter called and wanted to Know if a fax for patient's Spironolactone from  [place].Marland Kitchen This is so patient can heave her medicine delivered at home.

## 2021-09-03 ENCOUNTER — Emergency Department: Payer: Medicare HMO

## 2021-09-03 ENCOUNTER — Emergency Department
Admission: EM | Admit: 2021-09-03 | Discharge: 2021-09-03 | Disposition: A | Discharge status: Home / Self Care | Payer: Medicare HMO | Attending: Emergency Medicine | Admitting: Emergency Medicine

## 2021-09-03 ENCOUNTER — Other Ambulatory Visit: Payer: Self-pay

## 2021-09-03 DIAGNOSIS — F0280 Dementia in other diseases classified elsewhere without behavioral disturbance: Secondary | ICD-10-CM | POA: Diagnosis not present

## 2021-09-03 DIAGNOSIS — G309 Alzheimer's disease, unspecified: Secondary | ICD-10-CM | POA: Insufficient documentation

## 2021-09-03 DIAGNOSIS — Z7901 Long term (current) use of anticoagulants: Secondary | ICD-10-CM | POA: Insufficient documentation

## 2021-09-03 DIAGNOSIS — M545 Low back pain, unspecified: Secondary | ICD-10-CM | POA: Insufficient documentation

## 2021-09-03 DIAGNOSIS — M48061 Spinal stenosis, lumbar region without neurogenic claudication: Secondary | ICD-10-CM | POA: Diagnosis not present

## 2021-09-03 DIAGNOSIS — C3490 Malignant neoplasm of unspecified part of unspecified bronchus or lung: Secondary | ICD-10-CM | POA: Diagnosis not present

## 2021-09-03 DIAGNOSIS — M549 Dorsalgia, unspecified: Secondary | ICD-10-CM | POA: Diagnosis present

## 2021-09-03 DIAGNOSIS — N3289 Other specified disorders of bladder: Secondary | ICD-10-CM | POA: Diagnosis not present

## 2021-09-03 DIAGNOSIS — Z79899 Other long term (current) drug therapy: Secondary | ICD-10-CM | POA: Insufficient documentation

## 2021-09-03 DIAGNOSIS — R911 Solitary pulmonary nodule: Secondary | ICD-10-CM | POA: Insufficient documentation

## 2021-09-03 DIAGNOSIS — K5730 Diverticulosis of large intestine without perforation or abscess without bleeding: Secondary | ICD-10-CM | POA: Diagnosis not present

## 2021-09-03 DIAGNOSIS — M4316 Spondylolisthesis, lumbar region: Secondary | ICD-10-CM | POA: Diagnosis not present

## 2021-09-03 DIAGNOSIS — I700 Atherosclerosis of aorta: Secondary | ICD-10-CM | POA: Diagnosis not present

## 2021-09-03 LAB — URINALYSIS, ROUTINE W REFLEX MICROSCOPIC
Bilirubin Urine: NEGATIVE
Glucose, UA: NEGATIVE mg/dL
Hgb urine dipstick: NEGATIVE
Ketones, ur: NEGATIVE mg/dL
Leukocytes,Ua: NEGATIVE
Nitrite: NEGATIVE
Protein, ur: NEGATIVE mg/dL
Specific Gravity, Urine: 1.009 (ref 1.005–1.030)
pH: 8 (ref 5.0–8.0)

## 2021-09-03 MED ORDER — LIDOCAINE 4 % EX PTCH: 1.0000 | MEDICATED_PATCH | Freq: Every day | CUTANEOUS | 0 refills | Status: DC

## 2021-09-03 NOTE — ED Triage Notes (Signed)
Pt to ED with family for lower back pain upon awakening. Denies urinary sx. Denies injuries. Pt stage 4 lung cancer, no chemo .ambulatory Hx dementia

## 2021-09-03 NOTE — ED Notes (Signed)
Patient stable and discharged with all personal belongings and AVS. AVS and discharge instructions reviewed with patient and opportunity for questions provided.   

## 2021-09-03 NOTE — ED Provider Notes (Signed)
[facility] Emergency Department Provider Note ____________________________________________  Time seen: Approximately 2:35 PM  I have reviewed the triage vital signs and the nursing notes.  HISTORY  Chief Complaint Back Pain   HPI Valerie [name] is a 80 y.o. female presents to the ER for evaluation of left side back pain that started this morning. She and her daughter are concerned that it may be from metastatic disease. She has lung cancer for which she has chosen not to treat. She is under palliative care. Back pain is new. No urinary symptoms. No cough, fever, hematuria, or other concerns. No alleviating measures prior to arrival.   Past Medical History:  Diagnosis Date   Alzheimer disease [facility])    Per new patient packet- [place]    Atrial fibrillation ([place]) 2006   Per Records received from [facility])    Per [facility] packet- [facility]    Low oxygen saturation    Per new patient packet- [place]    Lung cancer [facility])    Per new patient packet- [facility]    On home oxygen therapy    Per [facility]   Pneumonia    Per [facility] patient packet- [facility]     Patient Active Problem List   Diagnosis Date Noted   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 Authorizing Provider  Lidocaine 4 % [facility] Apply 1 patch topically daily. 09/03/21  Yes Zamani Crocker B, FNP  Albuterol Sulfate (PROAIR RESPICLICK) 811 (90  Base) MCG/ACT AEPB Inhale 1 puff into the lungs daily as needed (For shortness of breath). 10/06/20 10/06/21  [provider]  apixaban (ELIQUIS) 5 MG TABS tablet Take 1 tablet (5 mg total) by mouth 2 (two) times daily. 11/12/20   [name], MD  atorvastatin (LIPITOR) 20 MG tablet Take 1 tablet by mouth once daily 08/16/21   [name], NP  Cholecalciferol 50 MCG (2000 UT) TABS Take 2,000 Units by mouth daily at 8 pm.     [provider]  furosemide (LASIX) 20 MG tablet TAKE 1 TABLET BY MOUTH ONCE DAILY(APPOINTMENT REQUIRED FOR FUTURE REFILLS) 06/22/21   Fenton Foy, NP  meclizine (ANTIVERT) 25 MG tablet Take 1 tablet (25 mg total) by mouth 3 (three) times daily as needed for dizziness. 02/18/21   Gareth Morgan, MD  methylPREDNISolone (MEDROL DOSEPAK) 4 MG TBPK tablet Take 6 pills on day one then decrease by 1 pill each day 05/15/21   Versie Starks, PA-C  OXYGEN Inhale 3 L into the lungs at bedtime.     [provider]  spironolactone (ALDACTONE) 25 MG tablet Take 1 tablet (25 mg total) by mouth daily. 08/31/21   [name], MD  vitamin B-12 (CYANOCOBALAMIN) 1000 MCG tablet Take 1,000 mcg by mouth daily.    [provider]  rivaroxaban (XARELTO) 20 MG TABS tablet Take 1 tablet (20 mg total) by mouth daily  with supper. 06/26/20 10/19/20  [name], MD    Allergies Patient has no known allergies.  Family History  Problem Relation Age of Onset   Alzheimer's disease Mother    Alzheimer's disease Father     Social History Social History   Tobacco Use   Smoking status: Never   Smokeless tobacco: Never  Vaping Use   Vaping Use: Never used  Substance Use Topics   Alcohol use: Never   Drug use: Never    Review of Systems Constitutional: Well appearing. Respiratory: Negative for dyspnea. Cardiovascular: Negative for change in skin temperature or color. Musculoskeletal:   Negative for chronic steroid  use   Negative for trauma in the presence of osteoporosis  Negative for age over 31 and trauma.  Negative for constitutional symptoms, positive for history of cancer  Negative for pain worse at night. Skin: Negative for rash, lesion, or wound.  Genitourinary: Negative for urinary retention, dysuria. Rectal: Negative for fecal incontinence or new onset constipation/bowel habit changes. Hematological/Immunilogical: Negative for immunosuppression, IV drug use, or fever Neurological: Negative for burning, tingling, numb, electric, radiating pain in the extremities.                        Negative for saddle anesthesia.                        Negative for focal neurologic deficit, progressive or disabling symptoms             Negative for saddle anesthesia. ____________________________________________   PHYSICAL EXAM:  VITAL SIGNS: ED Triage Vitals  Enc Vitals Group     BP 09/03/21 1331 (!) 127/94     Pulse Rate 09/03/21 1331 (!) 51     Resp 09/03/21 1331 20     Temp 09/03/21 1331 98 F (36.7 C)     Temp Source 09/03/21 1331 Oral     SpO2 09/03/21 1331 96 %     Weight 09/03/21 1329 135 lb (61.2 kg)     Height 09/03/21 1329 5\' 5"  (1.651 m)     Head Circumference --      Peak Flow --      Pain Score 09/03/21 1329 10     Pain Loc --      Pain Edu? --      Excl. in [place]? --     Constitutional: Alert and oriented. Well appearing and in no acute distress. Eyes: Conjunctivae are clear without discharge or drainage.  Head: Atraumatic. Neck: Full, active range of motion. Respiratory: Respirations even and unlabored. Musculoskeletal: Normal ROM of the back and extremities, Strength 5/5 of the lower extremities as tested. Neurologic: Reflexes of the lower extremities are 2+. Negataive straight leg raise on the right or left side. Skin: Atraumatic.  Psychiatric: Behavior and affect are normal.  ____________________________________________   LABS (all labs ordered are listed, but only  abnormal results are displayed)  Labs Reviewed  URINALYSIS, ROUTINE W REFLEX MICROSCOPIC - Abnormal; Notable for the following components:      Result Value   Color, Urine YELLOW (*)    APPearance CLEAR (*)    All other components within normal limits   ____________________________________________  RADIOLOGY  CT renal stone study does not show any obstructive uropathy or stone disease.  There is some mild edema of the mesenteric and intra-abdominal fat and subcutaneous fat with third spacing.  CT lumbar spine shows no definitive focal bone lesions  however study is limited due to bony under mineralization. ____________________________________________   PROCEDURES  Procedure(s) performed:  Procedures ____________________________________________   INITIAL IMPRESSION / ASSESSMENT AND PLAN / ED COURSE  Valerie [name] is a 80 y.o. female presenting to the emergency department for treatment and evaluation of left-sided back pain.  See HPI for further details.  Plan will be to get a urinalysis and a CT of the lumbar spine as well as abdomen and pelvis to rule out stone.  Patient offered pain medication however declined.  Urinalysis is clear.  CT of lumbar spine and L-spine is without definitive findings consistent with metastatic disease.  Results were discussed with the patient and her daughter.  Plan will be to have her follow-up with medical oncology if pain does not improve with time, lidocaine patches and Tylenol.  She is also encouraged to see primary care if symptoms change or worsen before she can get in with medical oncology.  Medications - No data to display  ED Discharge Orders          Ordered    Lidocaine 4 % [facility]  Daily        09/03/21 1741               As part of my medical decision making, I reviewed the following data within the [place] reviewed  and Radiograph reviewed.   Pertinent labs & imaging results that were available during my  care of the patient were reviewed by me and considered in my medical decision making (see chart for details).   _________________________________________   FINAL CLINICAL IMPRESSION(S) / ED DIAGNOSES   Final diagnoses:  Lumbar back pain     If controlled substance prescribed during this visit, 12 month history viewed on the [place] prior to issuing an initial prescription for Schedule II or III opiod.    Victorino Dike, FNP 09/03/21 Guy Franco, MD 09/06/21 [phone]

## 2021-09-06 ENCOUNTER — Other Ambulatory Visit: Payer: Self-pay

## 2021-09-06 ENCOUNTER — Encounter (INDEPENDENT_AMBULATORY_CARE_PROVIDER_SITE_OTHER): Payer: Self-pay | Admitting: Primary Care

## 2021-09-06 ENCOUNTER — Ambulatory Visit (INDEPENDENT_AMBULATORY_CARE_PROVIDER_SITE_OTHER): Payer: Medicare HMO | Admitting: Primary Care

## 2021-09-06 VITALS — BP 127/46 | HR 46 | Temp 97.5°F | Ht 64.0 in | Wt 136.8 lb

## 2021-09-06 DIAGNOSIS — M5440 Lumbago with sciatica, unspecified side: Secondary | ICD-10-CM | POA: Diagnosis not present

## 2021-09-06 NOTE — Progress Notes (Signed)
[facility]   Subjective:   [name] is a 80 y.o. Hispanic female presents for emergency room  follow up  on 09/03/21, for left side back pain . She and her daughter are concerned that it may be from metastatic disease. She has lung cancer for which she has chosen not to treat. She is under palliative care. No urinary symptoms. No cough, fever, hematuria, or other concerns. Past Medical History:  Diagnosis Date   Alzheimer disease [facility])    Per new patient packet- [place]    Atrial fibrillation ([place]) 2006   Per Records received from [place] [facility]    Per new patient packet- [facility]    Low oxygen saturation    Per new patient packet- [place]    Lung cancer ([place])    Per new patient packet- [place]    On home oxygen therapy    Per Records [facility] from [facility]   Pneumonia    Per new patient packet- [facility]      No Known Allergies    Current Outpatient Medications on File Prior to Visit  Medication Sig Dispense Refill   Albuterol Sulfate (PROAIR RESPICLICK) 254 (90 Base) MCG/ACT AEPB Inhale 1 puff into the lungs daily as needed (For shortness of breath).     apixaban (ELIQUIS) 5 MG TABS tablet Take 1 tablet (5 mg total) by mouth 2 (two) times daily. 60 tablet 5   atorvastatin (LIPITOR) 20 MG tablet Take 1 tablet by mouth once daily 90 tablet 0   furosemide (LASIX) 20 MG tablet TAKE 1 TABLET BY MOUTH ONCE DAILY(APPOINTMENT REQUIRED FOR FUTURE REFILLS) 30 tablet 0   spironolactone (ALDACTONE) 25 MG tablet Take 1 tablet (25 mg total) by mouth daily. 90 tablet 0   Cholecalciferol 50 MCG (2000 UT) TABS Take 2,000 Units by mouth daily at 8 pm.      Lidocaine 4 % PTCH Apply 1 patch topically daily. (Patient not taking: Reported on 09/06/2021) 15 patch 0   OXYGEN Inhale 3 L into the lungs at bedtime.      vitamin B-12 (CYANOCOBALAMIN) 1000 MCG tablet Take 1,000 mcg by mouth daily.     [DISCONTINUED] rivaroxaban (XARELTO) 20 MG TABS tablet Take 1 tablet (20 mg total) by  mouth daily with supper. 90 tablet 1   No current facility-administered medications on file prior to visit.     Review of System: Comprehesive  ROS negative except back pain  Objective:  Ht 5\' 4"  (1.626 m)   Wt 136 lb 12.8 oz (62.1 kg)   BMI 23.48 kg/m   Filed Weights   09/06/21 1043  Weight: 136 lb 12.8 oz (62.1 kg)   Physical Exam: General Appearance: Well nourished, in no apparent distress. Eyes: PERRLA, EOMs, conjunctiva no swelling or erythema Sinuses: No Frontal/maxillary tenderness ENT/Mouth: Ext aud canals clear, TMs without erythema, bulging.  Hearing normal.  Neck: Supple, thyroid normal.  Respiratory: Respiratory effort normal, BS equal bilaterally without rales, rhonchi, wheezing or stridor.  Cardio: RRR with no MRGs. Brisk peripheral pulses without edema.  Abdomen: Soft, + BS.  Non tender, no guarding, rebound, hernias, masses. Lymphatics: Non tender without lymphadenopathy.  Musculoskeletal: Full ROM, 5/5 strength, normal gait.  Skin: Warm, dry without rashes, lesions, ecchymosis.  Neuro: Cranial nerves intact. Normal muscle tone, no cerebellar symptoms. Sensation intact.  Psych: Awake and oriented X 3, normal affect, Insight and Judgment appropriate.    Assessment:  Valerie [name] was seen today for pain.  Diagnoses  and all orders for this visit:  Bilateral low back pain with sciatica, sciatica laterality unspecified, unspecified chronicity Discussed to use the Lidoderm patches. Know her history will mange pain if needed. Patient and daughter agreed and will follow up as needed.   This note has been created with Surveyor, quantity. Any transcriptional errors are unintentional.   [name], NP 09/06/2021, 10:48 AM

## 2021-09-07 ENCOUNTER — Telehealth (INDEPENDENT_AMBULATORY_CARE_PROVIDER_SITE_OTHER): Payer: Self-pay

## 2021-09-07 NOTE — Telephone Encounter (Signed)
Copied from [place] [id number]. Topic: General - Other >> Sep 07, 2021 10:15 AM Valere Dross wrote: Reason for CRM: Pts daughter called in stating PCP put pt on Lidocaine 4 % PTCH but it is not working, and they wanted to see about trying something different,   please advise.

## 2021-09-08 ENCOUNTER — Telehealth (INDEPENDENT_AMBULATORY_CARE_PROVIDER_SITE_OTHER): Payer: Self-pay | Admitting: Primary Care

## 2021-09-08 ENCOUNTER — Other Ambulatory Visit: Payer: Self-pay

## 2021-09-08 ENCOUNTER — Other Ambulatory Visit: Payer: Medicare HMO | Admitting: Hospice

## 2021-09-08 DIAGNOSIS — Z515 Encounter for palliative care: Secondary | ICD-10-CM

## 2021-09-08 DIAGNOSIS — M545 Low back pain, unspecified: Secondary | ICD-10-CM

## 2021-09-08 NOTE — Telephone Encounter (Signed)
Windy Canny NP with [facility] is calling in to speak with PCP. Windy Canny says that pt was seen by PCP and pt is still not feeling better.   Windy Canny would like to go over pt's current medications with provider.    CB: [phone]

## 2021-09-08 NOTE — Progress Notes (Signed)
[place] Consult Note Telephone: [phone]  Fax: [phone]  PATIENT NAME: Valerie [name] DOB: 10/16/1941 MRN: [id number]  PRIMARY CARE PROVIDER:   [name], NP [name], NP [address] [place],  [place] [address]  REFERRING PROVIDER: [name], NP [name], NP [address] [place],  [place] [address]  RESPONSIBLE PARTY:  Self/Oki Contact Information     Name Relation Home Work [place], [place] Daughter   [phone]   [facility] Daughter   [phone]     TELEHEALTH VISIT STATEMENT Due to the COVID-19 crisis, this visit was done via telemedicine from my office and it was initiated and consent by this patient and or family. Video-audio (telehealth) contact was unable to be done due to technical barriers from the patient's side. I connected with patient OR PROXY by a telephone  and verified that I am speaking with the correct person. I discussed the limitations of evaluation and management by telemedicine. The patient expressed understanding and agreed to proceed.   Visit is to build trust and highlight Palliative Medicine as specialized medical care for people living with serious illness, aimed at facilitating better quality of life through symptoms relief, assisting with advance care planning and complex medical decision making. [place] - daughter is present with patient during visit. This is a follow up visit.  RECOMMENDATIONS/PLAN:   Advance Care Planning/Code Status: Patient is a Do Not Resuscitate   Goals of Care: Goals of care include to maximize quality of life and symptom management. Patient is not interested in any treatment for her lung cancer. MOST selections include no feeding tube, IV fluids/antibiotics as indicated, limited additional intervention.    Visit consisted of counseling and education dealing with the complex and emotionally intense issues of symptom  management and palliative care in the setting of serious and potentially life-threatening illness. Palliative care team will continue to support patient, patient's family, and medical team.  Symptom management/Plan:  Left lower back pain: Likely related to Lung CA. Lidocaine patch and Tylenol not effective Recommendation: Morphine Sulphate 10mg  tab IR every 6 hours as needed for severe pain. NP contact PCP with recommendation; visit not will be faxed accordingly.  Routine CBC CMP Follow up: Palliative care will continue to follow for complex medical decision making, advance care planning, and clarification of goals. Return 6 weeks or prn. Encouraged to call provider sooner with any concerns.  CHIEF COMPLAINT: Palliative follow up  HISTORY OF PRESENT ILLNESS:  Valerie [name] a 80 y.o. female with multiple medical problems including acute pain on chronic left lower back, aching, severe. Pain is constant, impairs her sleep at night and also ADLs. She is currently on Lidocaine patch and Tylenol and this is not effective. She saw PCP for same pain 4 days ago and daughter is concerned that she does not want her mother to suffer. History of Dizziness, moderate protein caloric malnutrition Alzheimer dementia, lung cancer status post radiation, aortic insufficiency/stenosis, low back pain. History obtained from review of EMR, discussion with primary team, family and/or patient. Records reviewed and summarized above. All 10 point systems reviewed and are negative except as documented in history of present illness above  Review and summarization of Epic records shows history from other than patient.   Palliative Care was asked to follow this patient o help address complex decision making in the context of advance care planning and goals of care clarification. I reviewed, as needed, available labs, patient  records, imaging, studies and related documents from the EMR.    PERTINENT MEDICATIONS:  Outpatient Encounter  Medications as of 09/08/2021  Medication Sig   Albuterol Sulfate (PROAIR RESPICLICK) 202 (90 Base) MCG/ACT AEPB Inhale 1 puff into the lungs daily as needed (For shortness of breath).   apixaban (ELIQUIS) 5 MG TABS tablet Take 1 tablet (5 mg total) by mouth 2 (two) times daily.   atorvastatin (LIPITOR) 20 MG tablet Take 1 tablet by mouth once daily   Cholecalciferol 50 MCG (2000 UT) TABS Take 2,000 Units by mouth daily at 8 pm.    furosemide (LASIX) 20 MG tablet TAKE 1 TABLET BY MOUTH ONCE DAILY(APPOINTMENT REQUIRED FOR FUTURE REFILLS)   Lidocaine 4 % PTCH Apply 1 patch topically daily. (Patient not taking: Reported on 09/06/2021)   OXYGEN Inhale 3 L into the lungs at bedtime.    spironolactone (ALDACTONE) 25 MG tablet Take 1 tablet (25 mg total) by mouth daily.   vitamin B-12 (CYANOCOBALAMIN) 1000 MCG tablet Take 1,000 mcg by mouth daily.   [DISCONTINUED] rivaroxaban (XARELTO) 20 MG TABS tablet Take 1 tablet (20 mg total) by mouth daily with supper.   No facility-administered encounter medications on file as of 09/08/2021.    HOSPICE ELIGIBILITY/DIAGNOSIS: TBD  PAST MEDICAL HISTORY:  Past Medical History:  Diagnosis Date   Alzheimer disease [facility])    Per new patient packet- [place]    Atrial fibrillation ([place]) 2006   Per Records received from [facility])    Per new patient packet- [place]    Low oxygen saturation    Per new patient packet- [place]    L[facility] cancer [facility])    Per new patient packet- [place]    On home oxygen therapy    Per Records received from [facility]   Pneumonia    Per new patient packet- [place]: No Known Allergies     Thank you for the opportunity to participate in the care of Valerie [name] Please call our office at [phone] if we can be of additional assistance.  Note: Portions of this note were generated with Lobbyist. Dictation errors may occur despite best attempts at proofreading.  Teodoro Spray, NP

## 2021-09-08 NOTE — Telephone Encounter (Signed)
Please contact.

## 2021-09-09 ENCOUNTER — Other Ambulatory Visit (INDEPENDENT_AMBULATORY_CARE_PROVIDER_SITE_OTHER): Payer: Self-pay | Admitting: Primary Care

## 2021-09-09 DIAGNOSIS — Z515 Encounter for palliative care: Secondary | ICD-10-CM

## 2021-09-09 MED ORDER — ACETAMINOPHEN-CODEINE #3 300-30 MG PO TABS: 1.0000 | ORAL_TABLET | Freq: Four times a day (QID) | ORAL | 0 refills | Status: DC | PRN

## 2021-09-09 NOTE — Telephone Encounter (Signed)
Valerie [name] patient daughter called in to inquire of medication requested for pain and discomfort. Asking for a response today please  Ph# [phone]

## 2021-09-10 NOTE — Telephone Encounter (Signed)
Called spoke with daughter discussed MSO4 decided on Tylenol #3 may break in half or crush in applesauce Q 6 hrs prn pain

## 2021-09-13 NOTE — Progress Notes (Signed)
Cardiology Clinic Note   Patient Name: Valerie [name] Date of Encounter: 09/14/2021  Primary Care Provider:  [name], NP Primary Cardiologist:  [name], MD  Patient [place] 80 year old female presents the clinic today for follow-up evaluation of her atrial fibrillation and aortic stenosis.  Past Medical History    Past Medical History:  Diagnosis Date   Alzheimer disease [facility])    Per new patient packet- [place]    Atrial fibrillation ([place]) 2006   Per Records received from [facility])    Per new patient packet- [facility]    Low oxygen saturation    Per new patient packet- [place]    Lung cancer ([place])    Per new patient packet- [facility]    On home oxygen therapy    Per Records received from [facility]   Pneumonia    Per new patient packet- [place]    Past Surgical History:  Procedure Laterality Date   PARTIAL HYSTERECTOMY     Still with ovaries, Per new patient packet- [facility]    RIGHT/LEFT HEART CATH AND CORONARY ANGIOGRAPHY N/A 06/21/2019   Procedure: RIGHT/LEFT HEART CATH AND CORONARY ANGIOGRAPHY;  Surgeon: [name], [name], MD;  Location: [place] [facility];  Service: Cardiovascular;  Laterality: N/A;    Allergies  No Known Allergies  History of Present Illness    Jullisa Grigoryan has a PMH of severe aortic insufficiency, aortic stenosis, and persistent longstanding atrial fibrillation with slow ventricular response.  PMH also includes CHF, lung CA, OSA and dementia.  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  She was seen by Dr. Margaretann Loveless 10/21.  I discussed aortic valve replacement for her aortic valve disease.  It was felt that she would likely need PPM as well as TAVR procedure.  Patient reported that she was not inclined to have the procedure.  She discussed the procedure with her family.  Her family encouraged her to undergo TAVR evaluation and  the associated testing so that she may have a longer life span and better management of her symptoms.  The procedure was described.  Follow-up appointments with Dr. Burt Knack were scheduled.  She was noted to have chronic left axillary pain which was felt to be chronic angina.  She was not taking any anginal therapy at that time at her request.  She followed up with Dr. Burt Knack 10/13/2020.  She presented with her 2 daughters.  She reported exertional dyspnea with low-level activity.  She also noted orthopnea but no PND.  She routinely slept on 2-3 pillows.  She occasionally chest discomfort.  She also reported occasional lightheadedness but no syncope or presyncope type symptoms.  It was noted that she had not had regular dental care and reported poor dentition.  TAVR procedure and PPM were discussed.  She was felt to be high risk for the procedure.  Nose recommended that she have follow-up CTA and undergo dental extraction.  She presents the clinic today for follow-up evaluation states she feels okay.  Her daughter presents with her and reports that they are currently receiving treatment for stage IV lung cancer.  Her lung cancer has returned and has metastasized.  They do not wish to proceed with TAVR procedure.  She reports that her mother has increased fatigue and has trouble falling asleep at night.  She is doing better with the  addition of pain medication but has been prescribed from PCP.  Her blood pressures been well controlled at home.  She has been fairly sedentary at home.  She does report some dizziness with getting up fast I have recommended that she increase her p.o. hydration and wear lower extremity support stockings.  We will continue her current medications and plan follow-up for 6 months.  Today she denies chest pain, increased shortness of breath, lower extremity edema, fatigue, palpitations, melena, hematuria, hemoptysis, diaphoresis, weakness, presyncope, syncope, orthopnea, and  PND.       Home Medications    Prior to Admission medications   Medication Sig Start Date End Date Taking? Authorizing Provider  acetaminophen-codeine (TYLENOL #3) 300-30 MG tablet Take 1 tablet by mouth every 6 (six) hours as needed for moderate pain. 09/09/21   [name], NP  Albuterol Sulfate (PROAIR RESPICLICK) 045 (90 Base) MCG/ACT AEPB Inhale 1 puff into the lungs daily as needed (For shortness of breath). 10/06/20 10/06/21  [provider]  apixaban (ELIQUIS) 5 MG TABS tablet Take 1 tablet (5 mg total) by mouth 2 (two) times daily. 11/12/20   [name], MD  atorvastatin (LIPITOR) 20 MG tablet Take 1 tablet by mouth once daily 08/16/21   [name], NP  Cholecalciferol 50 MCG (2000 UT) TABS Take 2,000 Units by mouth daily at 8 pm.     [provider]  furosemide (LASIX) 20 MG tablet TAKE 1 TABLET BY MOUTH ONCE DAILY(APPOINTMENT REQUIRED FOR FUTURE REFILLS) 06/22/21   Fenton Foy, NP  Lidocaine 4 % PTCH Apply 1 patch topically daily. Patient not taking: Reported on 09/06/2021 09/03/21   Sherrie George B, FNP  OXYGEN Inhale 3 L into the lungs at bedtime.     [provider]  spironolactone (ALDACTONE) 25 MG tablet Take 1 tablet (25 mg total) by mouth daily. 08/31/21   [name], MD  vitamin B-12 (CYANOCOBALAMIN) 1000 MCG tablet Take 1,000 mcg by mouth daily.    [provider]  rivaroxaban (XARELTO) 20 MG TABS tablet Take 1 tablet (20 mg total) by mouth daily with supper. 06/26/20 10/19/20  [name], MD    Family History    Family History  Problem Relation Age of Onset   Alzheimer's disease Mother    Alzheimer's disease Father    She indicated that her mother is deceased. She indicated that her father is deceased. She indicated that five of her seven sisters are alive. She indicated that all of her three brothers are alive. She indicated that all of her three daughters are alive. She indicated that  all of her three sons are alive.  Social History    Social History   Socioeconomic History   Marital status: Married    Spouse name: Not on file   Number of children: Not on file   Years of education: Not on file   Highest education level: Not on file  Occupational History   Not on file  Tobacco Use   Smoking status: Never   Smokeless tobacco: Never  Vaping Use   Vaping Use: Never used  Substance and Sexual Activity   Alcohol use: Never   Drug use: Never   Sexual activity: Not on file  Other Topics Concern   Not on file  Social History Narrative   Per [place] new patient packet abstracted 03/28/2019      Diet: Vegetarian       Caffeine: None  Married, if yes what year: Yes, 1959      Do you live in a house, apartment, assisted living, condo, trailer, ect: House, 2 stories, 5 persons      Pets: Dog      Highest level of education: 6th grade       Current/Past profession: N/A      Exercise:No         Living Will: No   DNR: No   POA/HPOA: No      Functional Status:   Do you have difficulty bathing or dressing yourself? No   Do you have difficulty preparing food or eating? Yes   Do you have difficulty managing your medications? Yes   Do you have difficulty managing your finances? Yes   Do you have difficulty affording your medications? Nes   Social Determinants of Health   Financial Resource Strain: Low Risk    Difficulty of Paying Living Expenses: Not hard at all  Food Insecurity: No Food Insecurity   Worried About Charity fundraiser in the Last Year: Never true   Arboriculturist in the Last Year: Never true  Transportation Needs: No Transportation Needs   Lack of Transportation (Medical): No   Lack of Transportation (Non-Medical): No  Physical Activity: Inactive   Days of Exercise per Week: 0 days   Minutes of Exercise per Session: 0 min  Stress: No Stress Concern Present   Feeling of Stress : Not at all  Social Connections: Moderately Integrated    Frequency of Communication with Friends and Family: More than three times a week   Frequency of Social Gatherings with Friends and Family: Once a week   Attends Religious Services: More than 4 times per year   Active Member of [employer] or Organizations: No   Attends Music therapist: Never   Marital Status: Married  Human resources officer Violence: Not At Risk   Fear of Current or Ex-Partner: No   Emotionally Abused: No   Physically Abused: No   Sexually Abused: No     Review of Systems    General:  No chills, fever, night sweats or weight changes.  Cardiovascular:  No chest pain, dyspnea on exertion, edema, orthopnea, palpitations, paroxysmal nocturnal dyspnea. Dermatological: No rash, lesions/masses Respiratory: No cough, dyspnea Urologic: No hematuria, dysuria Abdominal:   No nausea, vomiting, diarrhea, bright red blood per rectum, melena, or hematemesis Neurologic:  No visual changes, wkns, changes in mental status. All other systems reviewed and are otherwise negative except as noted above.  Physical Exam    VS:  BP (!) 142/50 (BP Location: Right Arm, Patient Position: Sitting, Cuff Size: Normal)   Pulse (!) 45   Ht 5\' 4"  (1.626 m)   Wt 135 lb (61.2 kg)   BMI 23.17 kg/m  , BMI Body mass index is 23.17 kg/m. GEN: Well nourished, well developed, in no acute distress. HEENT: normal. Neck: Supple, no JVD, carotid bruits, or masses. Cardiac: RRR, 4/6 systolic murmur heard along right sternal border and left sternal border, rubs, or gallops. No clubbing, cyanosis, edema.  Radials/DP/PT 2+ and equal bilaterally.  Respiratory:  Respirations regular and unlabored, clear to auscultation bilaterally. GI: Soft, nontender, nondistended, BS + x 4. MS: no deformity or atrophy. Skin: warm and dry, no rash. Neuro:  Strength and sensation are intact. Psych: Normal affect.  Accessory Clinical Findings    Recent Labs: 02/18/2021: BUN 16; Creatinine, Ser 0.67; Hemoglobin 10.2;  Platelets 129; Potassium 4.7; Sodium 137  Recent Lipid Panel    Component Value Date/Time   CHOL 139 06/25/2019 0934   TRIG 49 06/25/2019 0934   HDL 70 06/25/2019 0934   CHOLHDL 2.0 06/25/2019 0934   LDLCALC 55 06/25/2019 0934    ECG personally reviewed by me today-atrial fibrillation with slow ventricular response left axis deviation septal infarct undetermined age 60 bpm- No acute changes  Echocardiogram 09/11/2020 IMPRESSIONS     1. Left ventricular ejection fraction, by estimation, is 50 to 55%. The  left ventricle has low normal function. The left ventricle has no regional  wall motion abnormalities. There is moderate left ventricular hypertrophy.  Left ventricular diastolic  function could not be evaluated. There is the interventricular septum is  flattened in systole and diastole, consistent with right ventricular  pressure and volume overload.   2. Left atrial size was Massively dilated (>100 ml/m2).   3. The mitral valve is abnormal. Mild mitral valve regurgitation.   4. The aortic valve is calcified. Aortic valve regurgitation is severe.  Moderate to severe aortic valve stenosis. Aortic regurgitation PHT  measures 282 msec. Aortic valve area, by VTI measures 1.00 cm. Aortic  valve mean gradient measures 29.0 mmHg.  Aortic valve Vmax measures 3.69 m/s. DI is 0.26.   5. Aortic dilatation noted. There is mild dilatation of the ascending  aorta, measuring 39 mm.   6. Right atrial size was severely dilated.   7. Evidence of atrial level shunting detected by color flow Doppler.  There is a small patent foramen ovale with predominantly left to right  shunting across the atrial septum.   8. The inferior vena cava is dilated in size with <50% respiratory  variability, suggesting right atrial pressure of 15 mmHg.   9. Right ventricular systolic function is normal. The right ventricular  size is normal. There is moderately elevated pulmonary artery systolic  pressure. The  estimated right ventricular systolic pressure is 32.3 mmHg.  CT angio abdomen and pelvis 10/15/2020  IMPRESSION: 1. Vascular findings and measurements pertinent to potential TAVR procedure, as detailed above. 2. Severe thickening calcification of the aortic valve, compatible with reported clinical history of severe aortic stenosis. 3. Large filling defect in the tip of the left atrial appendage compatible with left atrial appendage thrombus. 4. Numerous pulmonary nodules scattered throughout the lungs bilaterally, highly concerning for metastatic disease. Any one of these nodules could represent a primary bronchogenic malignancy. Further evaluation with nonemergent PET-CT is recommended in the near future to better evaluate these findings. 5. Probable area of chronic post infectious or inflammatory scarring in the periphery of the right lung. The possibility that a primary bronchogenic neoplasm exists within this area of apparent scarring is not excluded. Attention at time of forthcoming PET-CT is recommended. 6. No definite signs of metastatic disease confidently identified in the abdomen or pelvis. 7. Severe dilatation of the pulmonic trunk (4.4 cm in diameter), concerning for pulmonary arterial hypertension. 8. Aortic atherosclerosis, in addition to left main and 2 vessel coronary artery disease. There is also ectasia of the ascending thoracic aorta (4.0 cm in diameter). Recommend annual imaging followup by CTA or MRA. This recommendation follows 2010 ACCF/AHA/AATS/ACR/ASA/SCA/SCAI/SIR/STS/SVM Guidelines for the Diagnosis and Management of Patients with Thoracic Aortic Disease. Circulation. 2010; 121: F573-U202. Aortic aneurysm NOS (ICD10-I71.9). 9. Additional incidental findings, as above. 10. These results will be called to the ordering clinician or representative by the Radiologist Assistant, and communication documented in the PACS or [employer].     Electronically  Signed  By: Vinnie Langton M.D.   On: 10/19/2020 10:26  CT angio chest aorta 10/15/2020  IMPRESSION: 1. Vascular findings and measurements pertinent to potential TAVR procedure, as detailed above. 2. Severe thickening calcification of the aortic valve, compatible with reported clinical history of severe aortic stenosis. 3. Large filling defect in the tip of the left atrial appendage compatible with left atrial appendage thrombus. 4. Numerous pulmonary nodules scattered throughout the lungs bilaterally, highly concerning for metastatic disease. Any one of these nodules could represent a primary bronchogenic malignancy. Further evaluation with nonemergent PET-CT is recommended in the near future to better evaluate these findings. 5. Probable area of chronic post infectious or inflammatory scarring in the periphery of the right lung. The possibility that a primary bronchogenic neoplasm exists within this area of apparent scarring is not excluded. Attention at time of forthcoming PET-CT is recommended. 6. No definite signs of metastatic disease confidently identified in the abdomen or pelvis. 7. Severe dilatation of the pulmonic trunk (4.4 cm in diameter), concerning for pulmonary arterial hypertension. 8. Aortic atherosclerosis, in addition to left main and 2 vessel coronary artery disease. There is also ectasia of the ascending thoracic aorta (4.0 cm in diameter). Recommend annual imaging followup by CTA or MRA. This recommendation follows 2010 ACCF/AHA/AATS/ACR/ASA/SCA/SCAI/SIR/STS/SVM Guidelines for the Diagnosis and Management of Patients with Thoracic Aortic Disease. Circulation. 2010; 121: Y782-N562. Aortic aneurysm NOS (ICD10-I71.9). 9. Additional incidental findings, as above. 10. These results will be called to the ordering clinician or representative by the Radiologist Assistant, and communication documented in the PACS or [employer].     Electronically  Signed   By: Vinnie Langton M.D.   On: 10/19/2020 10:26  Coronary CTA 10/15/2020  IMPRESSION: 1. Tricuspid aortic valve. Moderately reduced cusp separation, with best preserved motion of the left coronary cusp. Incomplete cusp coaptation in diastole. Severely thickened, Severely calcified aortic valve cusps.   2.  AV calcium score: 1419   3. Annulus Area: 541 mm2. Based on these measurements, valve suited for 26 mm Sapien 3 THV.   4.  Adequate coronary artery heights from annulus.   5. The right coronary artery was previously felt to be chronically occluded on coronary angiography, however on this exam appears patent with no obvious ostial stenosis.   6. Severe dilation of main pulmonary artery, 44 mm, consistent with known pulmonary hypertension.   7. Cannot exclude LA appendage thrombus.   8. Optimum Fluoroscopic Angle for Delivery: LAO 21, CAU 21     Electronically Signed   By: Cherlynn Kaiser   On: 10/19/2020 10:09  Cardiac catheterization 06/21/2019  The left ventricular systolic function is normal. LV end diastolic pressure is normal. The left ventricular ejection fraction is 55-65% by visual estimate. There is mild aortic valve stenosis. Hemodynamic findings consistent with mild pulmonary hypertension.   1. Normal left coronary artery- very large dominant vessel. RCA could not be visualized. 2. Normal LV function 3. Mild aortic stenosis. Mean gradient 17 mm Hg. Valve area 1.85 cm squared with index 1.02.  4. Moderate to severe AI 5. Mild pulmonary HTN.  6. Normal LV filling pressures but prominent V wave on wedge tracing. 7. Normal cardiac output.   Plan; per primary cardiology team.   Diagnostic Dominance: Left Intervention   Assessment & Plan   1.  Severe aortic [id number]-9/5 systolic murmur heard along right sternal border and left sternal border.  Continues with stable DOE.  Underwent testing/CT scans for TAVR.  Does not wish to proceed  with surgery. Continue furosemide, Heart healthy low-sodium diet-salty 6 given Increase physical activity as tolerated Order BMP  Stage IV lung cancer with mets-receiving treatment from oncology.  Does not wish to proceed with TAVR. Continue to follow with oncology  Atrial fibrillation-EKG today shows atrial fibrillation with slow ventricular response left axis deviation septal infarct undetermined age 44 bpm.  Denies recent episodes of increased or accelerated heart rate.  Reports compliance with apixaban and denies bleeding issues Continue apixaban Heart healthy low-sodium diet-salty 6 given Increase physical activity as tolerated   Chronic diastolic CHF-bilateral generalized nonpitting edema.  No increased DOE or activity intolerance.Echocardiogram 09/11/2020 showed EF 50-55%, moderate LVH, mild aortic dilation, Severe aortic regurgitation with moderate-severe aortic stenosis.  Diastolic parameters could not be evaluated. Continue furosemide, spironolactone Heart healthy low-sodium diet-salty 6 given Increase physical activity as tolerated  Hyperlipidemia-LDL 55 06/25/2019 Continue atorvastatin Heart healthy low-sodium diet-salty 6 given Increase physical activity as tolerated   OSA-CPAP intolerant Continue side sleeping Heart healthy low-sodium diet-salty 6 given Increase physical activity as tolerated Continue weight loss  Chest discomfort-chronic.  Continues to have intermittent periods of chest discomfort but are nonexertional in nature. No plans for ischemic evaluation at this time  Coronary artery disease-no chest pain today.  Underwent cardiac catheterization 06/21/2019 which showed normal coronary anatomy. Continue atorvastatin Heart healthy low-sodium diet-salty 6 given Increase physical activity as tolerated  Disposition: Follow-up with Dr. Margaretann Loveless in 6.   Jossie Ng. Seann Genther NP-C    09/14/2021, 2:16 PM [facility] [place] [address] Office [phone] Fax [phone]  Notice: This dictation was prepared with Dragon dictation along with smaller phrase technology. Any transcriptional errors that result from this process are unintentional and may not be corrected upon review.  I spent 14 minutes examining this patient, reviewing medications, and using patient centered shared decision making involving her cardiac care.  Prior to her visit I spent greater than 20 minutes reviewing her past medical history,  medications, and prior cardiac tests.

## 2021-09-14 ENCOUNTER — Other Ambulatory Visit: Payer: Self-pay

## 2021-09-14 ENCOUNTER — Ambulatory Visit (INDEPENDENT_AMBULATORY_CARE_PROVIDER_SITE_OTHER): Payer: Self-pay | Admitting: *Deleted

## 2021-09-14 ENCOUNTER — Ambulatory Visit: Payer: Medicare HMO | Admitting: General Practice

## 2021-09-14 ENCOUNTER — Encounter: Payer: Self-pay | Admitting: General Practice

## 2021-09-14 VITALS — BP 142/50 | HR 45 | Ht 64.0 in | Wt 135.0 lb

## 2021-09-14 DIAGNOSIS — G4733 Obstructive sleep apnea (adult) (pediatric): Secondary | ICD-10-CM | POA: Diagnosis not present

## 2021-09-14 DIAGNOSIS — I25119 Atherosclerotic heart disease of native coronary artery with unspecified angina pectoris: Secondary | ICD-10-CM | POA: Diagnosis not present

## 2021-09-14 DIAGNOSIS — I351 Nonrheumatic aortic (valve) insufficiency: Secondary | ICD-10-CM

## 2021-09-14 DIAGNOSIS — I5032 Chronic diastolic (congestive) heart failure: Secondary | ICD-10-CM

## 2021-09-14 DIAGNOSIS — E782 Mixed hyperlipidemia: Secondary | ICD-10-CM | POA: Diagnosis not present

## 2021-09-14 DIAGNOSIS — I4891 Unspecified atrial fibrillation: Secondary | ICD-10-CM | POA: Diagnosis not present

## 2021-09-14 DIAGNOSIS — I513 Intracardiac thrombosis, not elsewhere classified: Secondary | ICD-10-CM | POA: Diagnosis not present

## 2021-09-14 DIAGNOSIS — R079 Chest pain, unspecified: Secondary | ICD-10-CM

## 2021-09-14 DIAGNOSIS — I110 Hypertensive heart disease with heart failure: Secondary | ICD-10-CM

## 2021-09-14 MED ORDER — ATORVASTATIN CALCIUM 20 MG PO TABS: 20.0000 mg | ORAL_TABLET | Freq: Every day | ORAL | 3 refills | Status: DC

## 2021-09-14 MED ORDER — APIXABAN 5 MG PO TABS: 5.0000 mg | ORAL_TABLET | Freq: Two times a day (BID) | ORAL | 5 refills | Status: AC

## 2021-09-14 MED ORDER — FUROSEMIDE 20 MG PO TABS: 20.0000 mg | ORAL_TABLET | Freq: Every day | ORAL | 1 refills | Status: DC

## 2021-09-14 NOTE — Telephone Encounter (Signed)
Sent to PCP ?

## 2021-09-14 NOTE — Telephone Encounter (Signed)
Reason for Disposition . Vomiting a prescription medication    Tylenol #3 causing vomiting.  Answer Assessment - Initial Assessment Questions 1. VOMITING SEVERITY: "How many times have you vomited in the past 24 hours?"     - MILD:  1 - 2 times/day    - MODERATE: 3 - 5 times/day, decreased oral intake without significant weight loss or symptoms of dehydration    - SEVERE: 6 or more times/day, vomits everything or nearly everything, with significant weight loss, symptoms of dehydration      Daughter calling in has same name as her mother Valerie [name].   Her mother started vomiting yesterday after taking the new prescription for the Tylenol #3.  She has stage 4 cancer in lungs.   She was prescribed Tylenol #3 for pain.   She has an appetite and been eating but the medication is causing her to vomit. 2. ONSET: "When did the vomiting begin?"      Yesterday.   She vomited soup yesterday and earlier today.    3. FLUIDS: "What fluids or food have you vomited up today?" "Have you been able to keep any fluids down?"     She vomited soup today.     4. ABDOMINAL PAIN: "Are your having any abdominal pain?" If yes : "How bad is it and what does it feel like?" (e.g., crampy, dull, intermittent, constant)      No pain  5. DIARRHEA: "Is there any diarrhea?" If Yes, ask: "How many times today?"      No diarrhea 6. CONTACTS: "Is there anyone else in the family with the same symptoms?"      No 7. CAUSE: "What do you think is causing your vomiting?"     Tylenol #3 for cancer pain. 8. HYDRATION STATUS: "Any signs of dehydration?" (e.g., dry mouth [not only dry lips], too weak to stand) "When did you last urinate?"     Not asked 9. OTHER SYMPTOMS: "Do you have any other symptoms?" (e.g., fever, headache, vertigo, vomiting blood or coffee grounds, recent head injury)     No other symptoms.   Pretty sure it's from the Tylenol #3 because she didn't vomit until she started taking this medication 10. PREGNANCY: "Is  there any chance you are pregnant?" "When was your last menstrual period?"       N/A due to age  Protocols used: Vomiting-A-AH

## 2021-09-14 NOTE — Patient Instructions (Signed)
Medication Instructions:  Your Physician recommend you continue on your current medication as directed.    *If you need a refill on your cardiac medications before your next appointment, please call your pharmacy*   Lab Work: None.  Testing/Procedures: None    Follow-Up: At [facility], you and your health needs are our priority.  As part of our continuing mission to provide you with exceptional heart care, we have created designated Provider Care Teams.  These Care Teams include your primary Cardiologist (physician) and Advanced Practice Providers (APPs -  Physician Assistants and Nurse Practitioners) who all work together to provide you with the care you need, when you need it.  We recommend signing up for the patient portal called "MyChart".  Sign up information is provided on this After Visit Summary.  MyChart is used to connect with patients for Virtual Visits (Telemedicine).  Patients are able to view lab/test results, encounter notes, upcoming appointments, etc.  Non-urgent messages can be sent to your provider as well.   To learn more about what you can do with MyChart, go to [email or web address].    Your next appointment:   6 month(s)  The format for your next appointment:   In Person  Provider:   Coletta Memos, FNP

## 2021-09-15 NOTE — Telephone Encounter (Signed)
Called patient daughter problem was taking pain medication with food. Explained eat first wait 15-30 mins then give pain medication. Medication is working for the pain was causing vomiting.

## 2021-09-23 DIAGNOSIS — J9611 Chronic respiratory failure with hypoxia: Secondary | ICD-10-CM | POA: Diagnosis not present

## 2021-09-23 DIAGNOSIS — C3490 Malignant neoplasm of unspecified part of unspecified bronchus or lung: Secondary | ICD-10-CM | POA: Diagnosis not present

## 2021-09-27 ENCOUNTER — Other Ambulatory Visit (INDEPENDENT_AMBULATORY_CARE_PROVIDER_SITE_OTHER): Payer: Self-pay | Admitting: Primary Care

## 2021-09-27 NOTE — Telephone Encounter (Signed)
Sent to PCP ?

## 2021-09-30 ENCOUNTER — Telehealth (INDEPENDENT_AMBULATORY_CARE_PROVIDER_SITE_OTHER): Payer: Self-pay | Admitting: Primary Care

## 2021-09-30 ENCOUNTER — Other Ambulatory Visit (INDEPENDENT_AMBULATORY_CARE_PROVIDER_SITE_OTHER): Payer: Self-pay | Admitting: Primary Care

## 2021-09-30 NOTE — Telephone Encounter (Signed)
Requested medications are due for refill today.  Just refilled 09/27/2021  Requested medications are on the active medications list.  yes  Last refill. 09/27/2021  Future visit scheduled.   no  Notes to clinic.  Medication not delegated    Requested Prescriptions  Pending Prescriptions Disp Refills   acetaminophen-codeine (TYLENOL #3) 300-30 MG tablet [Pharmacy Med Name: Acetaminophen-Codeine #3 300-30 MG Oral Tablet] 30 tablet 0    Sig: TAKE 1 TABLET BY MOUTH EVERY 6 HOURS AS NEEDED FOR MODERATE PAIN     Not Delegated - Analgesics:  Opioid Agonist Combinations Failed - 09/30/2021  5:30 AM      Failed - This refill cannot be delegated      Failed - Urine Drug Screen completed in last 360 days      Passed - Valid encounter within last 6 months    Recent Outpatient Visits           3 weeks ago Bilateral low back pain with sciatica, sciatica laterality unspecified, unspecified chronicity   [facility] [name], NP   2 months ago Encounter for [employer] annual wellness exam   [place] [name], NP   6 months ago Dizziness and giddiness   [facility] [name], NP   1 year ago Atrial fibrillation, unspecified type ([place])   [facility] Charlott Rakes, MD   1 year ago Encounter for medical examination to establish care   [facility] [name], NP

## 2021-09-30 NOTE — Telephone Encounter (Signed)
[facility] called and spoke to Byron, Merchant navy officer about the refill(s) Tylenol #3 requested. Advised it was sent on 09/27/21 #30/0 refill(s). She says they have it but the provider's DEA on the prescription is expired. She says a new Rx will need to be sent over with the updated DEA.

## 2021-09-30 NOTE — Telephone Encounter (Signed)
Medication Refill - Medication: acetaminophen-codeine (TYLENOL #3) 300-30 MG  Has the patient contacted their pharmacy? yes (Agent: If no, request that the patient contact the pharmacy for the refill. If patient does not wish to contact the pharmacy document the reason why and proceed with request.) (Agent: If yes, when and what did the pharmacy advise?)contact pcp  Preferred Pharmacy (with phone number or street name):  [place] ([place]), [place] - [address] Phone:  [phone]  Fax:  [phone]     Has the patient been seen for an appointment in the last year OR does the patient have an upcoming appointment? yes  Agent: Please be advised that RX refills may take up to 3 business days. We ask that you follow-up with your pharmacy.

## 2021-10-01 ENCOUNTER — Other Ambulatory Visit (INDEPENDENT_AMBULATORY_CARE_PROVIDER_SITE_OTHER): Payer: Self-pay | Admitting: Primary Care

## 2021-10-01 NOTE — Telephone Encounter (Signed)
See previous note. New Rx needs DEA #.

## 2021-10-01 NOTE — Telephone Encounter (Signed)
Daughter calling back, as she has been calling since Monday. She doesn't want mother to be in any pain, here it is the weekend and no Rx. Please advise as pt is out of pills today.

## 2021-10-04 NOTE — Telephone Encounter (Signed)
Resent. Patient daughter aware. Has picked up.

## 2021-10-24 DIAGNOSIS — J9611 Chronic respiratory failure with hypoxia: Secondary | ICD-10-CM | POA: Diagnosis not present

## 2021-10-24 DIAGNOSIS — C3490 Malignant neoplasm of unspecified part of unspecified bronchus or lung: Secondary | ICD-10-CM | POA: Diagnosis not present

## 2021-11-04 ENCOUNTER — Other Ambulatory Visit: Payer: Medicare HMO | Admitting: Hospice

## 2021-11-04 ENCOUNTER — Other Ambulatory Visit: Payer: Self-pay

## 2021-11-04 DIAGNOSIS — R634 Abnormal weight loss: Secondary | ICD-10-CM

## 2021-11-04 DIAGNOSIS — C3480 Malignant neoplasm of overlapping sites of unspecified bronchus and lung: Secondary | ICD-10-CM

## 2021-11-04 DIAGNOSIS — M545 Low back pain, unspecified: Secondary | ICD-10-CM

## 2021-11-04 DIAGNOSIS — Z515 Encounter for palliative care: Secondary | ICD-10-CM

## 2021-11-04 NOTE — Progress Notes (Signed)
[facility] Consult Note Telephone: [phone]  Fax: [phone]  PATIENT NAME: Valerie [name] DOB: 05/28/1941 MRN: [id number]  PRIMARY CARE PROVIDER:   [name], NP Valerie Perna, NP [address] [place],  [place] [address]  REFERRING PROVIDER: [name], NP Valerie Perna, NP [address] [place],  [place] [address]  RESPONSIBLE PARTY:  Self/Valerie [name] [phone] Valerie [name] is second Emergency contact: Valerie [name]     Name Relation Home Work [place], Valerie [name] Daughter     [phone]    [name] Daughter     [phone]         Visit is to build trust and highlight Palliative Medicine as specialized medical care for people living with serious illness, aimed at facilitating better quality of life through symptoms relief, assisting with advance care planning and complex medical decision making. [place] and [place] are present with patient during visit. This is a follow up visit.   RECOMMENDATIONS/PLAN:   Advance Care Planning/Code Status: Patient is a DO NOT RESUSCITATE.   Goals of Care:Goals of care include to maximize quality of life and symptom management. Patient is not interested in any treatment for her lung cancer. MOST selections include no feeding tube, IV fluids/antibiotics as indicated, limited additional intervention. Patient is interested in hospice service in the future. She will like end of life care in her home.    Visit consisted of counseling and education dealing with the complex and emotionally intense issues of symptom management and palliative care in the setting of serious and potentially life-threatening illness. Valerie [name] faith help patient and  family to cope.  Palliative care team will continue to support patient, patient's family, and medical team.   3. Symptom management/Plan:  Weight loss: likely related to Lung CA. Offer enough time and  assistance during meals; offer 4-6 small meals. Encourage fruits and vegetable in smoothie forms. Boost daily.  Lung CA: s/p radiation. Patient refuses further treatment.  Continue ongoing supportive care Dementia: memory loss/confusion at baseline. FAST 5, FLACC 0. Low back pain: Improved.  Managed with Tylenol with Codeine. Continue use of heating pad and activity as tolerated.  Balance of rest and performance activity.  Palliative will continue to monitor for symptom management/decline and make recommendations as needed. Return 2 months or prn. Encouraged to call provider sooner with any concerns.   CHIEF COMPLAINT: Palliative follow up visit  HISTORY OF PRESENT ILLNESS: Follow-up visit for Valerie [name],  a 81 y.o. female with multiple medical problems including lung cancer for which patient refuses further treatment. Patient continues on weight loss, sarcopenia, despite good appetite.  History of Afib,  Aortic insufficiency/stenosis- followed by Cardiologist. Patient with history of Alzheimer's Dementia, dizziness, constipation, Afib.  Today, patient denies pain/discomfort, in no respiratory distress History obtained from review of EMR, discussion with primary team, family and/or patient. Records reviewed and summarized above. All 10 point systems reviewed and are negative except as documented in history of present illness above  Review and summarization of Epic records shows history from other than patient.   Palliative Care was asked to follow this patient o help address complex decision making in the context of advance care planning and goals of care clarification.   PHYSICAL EXAM  Height/Weight: 5 feet 4 inches/125 Ibs down from 142 Ibs 3 months ago BP 118/54 02 98% R 18 P 54 General: In no acute  distress, appropriately dressed Cardiovascular: regular rate and rhythm; no edema in BLE Pulmonary: no cough, no increased work of breathing, normal respiratory effort on room air Abdomen: soft,  non tender, no guarding, positive bowel sounds in all quadrants GU:  no suprapubic tenderness Eyes: Normal lids, no discharge ENMT: Moist mucous membranes Musculoskeletal:  weakness, ambulatory Skin: no rash to visible skin, warm without cyanosis,  Psych: non-anxious affect Neurological: Weakness but otherwise non focal Heme/lymph/immuno: no bruises, no bleeding  PERTINENT MEDICATIONS:  Outpatient Encounter Medications as of 11/04/2021  Medication Sig   acetaminophen-codeine (TYLENOL #3) 300-30 MG tablet TAKE 1 TABLET BY MOUTH EVERY 6 HOURS AS NEEDED FOR MODERATE PAIN   Albuterol Sulfate (PROAIR RESPICLICK) 732 (90 Base) MCG/ACT AEPB Inhale 1 puff into the lungs daily as needed (For shortness of breath).   apixaban (ELIQUIS) 5 MG TABS tablet Take 1 tablet (5 mg total) by mouth 2 (two) times daily.   atorvastatin (LIPITOR) 20 MG tablet Take 1 tablet (20 mg total) by mouth daily.   Cholecalciferol 50 MCG (2000 UT) TABS Take 2,000 Units by mouth daily at 8 pm.    furosemide (LASIX) 20 MG tablet Take 1 tablet (20 mg total) by mouth daily.   OXYGEN Inhale 3 L into the lungs at bedtime.    spironolactone (ALDACTONE) 25 MG tablet Take 1 tablet (25 mg total) by mouth daily.   vitamin B-12 (CYANOCOBALAMIN) 1000 MCG tablet Take 1,000 mcg by mouth daily.   [DISCONTINUED] rivaroxaban (XARELTO) 20 MG TABS tablet Take 1 tablet (20 mg total) by mouth daily with supper.   No facility-administered encounter medications on file as of 11/04/2021.    HOSPICE ELIGIBILITY/DIAGNOSIS: TBD  PAST MEDICAL HISTORY:  Past Medical History:  Diagnosis Date   Alzheimer disease [name])    Per new patient packet- [place]    Atrial fibrillation ([place]) 2006   Per Records received from [name])    Per new patient packet- [facility]    Low oxygen saturation    Per new patient packet- [place]    Lung cancer ([place])    Per new patient packet- [place]    On home oxygen therapy    Per Records received from [facility]    Pneumonia    Per new patient packet- [place]: No Known Allergies    I spent 60 minutes providing this consultation; this includes time spent with patient/family, chart review and documentation. More than 50% of the time in this consultation was spent on counseling and coordinating communication   Thank you for the opportunity to participate in the care of Valerie [name] Please call our office at [phone] if we can be of additional assistance.  Note: Portions of this note were generated with Lobbyist. Dictation errors may occur despite best attempts at proofreading.  Teodoro Spray, NP

## 2021-11-17 ENCOUNTER — Other Ambulatory Visit (INDEPENDENT_AMBULATORY_CARE_PROVIDER_SITE_OTHER): Payer: Self-pay | Admitting: Primary Care

## 2021-11-17 MED ORDER — ACETAMINOPHEN-CODEINE #3 300-30 MG PO TABS: 1.0000 | ORAL_TABLET | Freq: Four times a day (QID) | ORAL | 0 refills | Status: AC | PRN

## 2021-11-24 DIAGNOSIS — C3490 Malignant neoplasm of unspecified part of unspecified bronchus or lung: Secondary | ICD-10-CM | POA: Diagnosis not present

## 2021-11-24 DIAGNOSIS — J9611 Chronic respiratory failure with hypoxia: Secondary | ICD-10-CM | POA: Diagnosis not present

## 2021-11-26 ENCOUNTER — Telehealth: Payer: Medicare HMO | Admitting: Family Medicine

## 2021-11-26 ENCOUNTER — Other Ambulatory Visit: Payer: Self-pay

## 2021-11-26 ENCOUNTER — Ambulatory Visit (INDEPENDENT_AMBULATORY_CARE_PROVIDER_SITE_OTHER): Payer: Self-pay | Admitting: *Deleted

## 2021-11-26 ENCOUNTER — Ambulatory Visit
Admission: EM | Admit: 2021-11-26 | Discharge: 2021-11-26 | Disposition: A | Discharge status: Home / Self Care | Payer: Medicare HMO | Attending: Emergency Medicine | Admitting: Emergency Medicine

## 2021-11-26 ENCOUNTER — Other Ambulatory Visit (INDEPENDENT_AMBULATORY_CARE_PROVIDER_SITE_OTHER): Payer: Self-pay | Admitting: Primary Care

## 2021-11-26 DIAGNOSIS — U071 COVID-19: Secondary | ICD-10-CM

## 2021-11-26 MED ORDER — PREDNISONE 10 MG (21) PO TBPK: ORAL_TABLET | Freq: Every day | ORAL | 0 refills | Status: DC

## 2021-11-26 NOTE — Discharge Instructions (Addendum)
Discussed with family members that patient begins to have increased shortness of breath more than her normal and after taking steroid and using an at home albuterol does not help patient needs to be seen in the emergency room. We will send off her COVID test which can take 6 to 48 hours to return check her MyChart for results Due to patient's medications and will interfere with the antivirals Use the at home oxygen that you have as needed

## 2021-11-26 NOTE — Telephone Encounter (Signed)
Routed to PCP as HIGH alert

## 2021-11-26 NOTE — Patient Instructions (Signed)
Please follow up at an Urgent Care to have PCR testing and assessment of Ms [facility] for best treatment plans.

## 2021-11-26 NOTE — Telephone Encounter (Signed)
Medication: acetaminophen-codeine (TYLENOL #3) 300-30 MG tablet [[id number]]    Has the patient contacted their pharmacy? YES Advised to contact the office (Agent: If no, request that the patient contact the pharmacy for the refill. If patient does not wish to contact the pharmacy document the reason why and proceed with request.) (Agent: If yes, when and what did the pharmacy advise?)  Preferred Pharmacy (with phone number or street name): [place] ([place]), [place] - [address] [place] [address] Phone: [phone] Fax: [phone] Hours: Not open 24 hours   Has the patient been seen for an appointment in the last year OR does the patient have an upcoming appointment? YES 09/02/2021  Agent: Please be advised that RX refills may take up to 3 business days. We ask that you follow-up with your pharmacy.

## 2021-11-26 NOTE — ED Provider Notes (Signed)
[name]    CSN: [id number] Arrival date & time: 11/26/21  1330      History   Chief Complaint Chief Complaint  Patient presents with   Covid Positive    Home test + this morning    HPI Valerie [name] is a 81 y.o. female.   Family members brought in patient for a positive COVID test this a.m.  Patient's other for member that takes care of her also tested positive for COVID.  Patient has dementia so she is confused at times.  Patient also has a lung cancer and is not getting treatment for this so already has some shortness of breath and low O2 sats she is on oxygen O2 at home.  Patient currently denies any symptoms no distress no fevers no cough.  Members would like to have another COVID test completed and sent off.   Past Medical History:  Diagnosis Date   Alzheimer disease [facility])    Per new patient packet- [place]    Atrial fibrillation ([place]) 2006   Per Records received from [facility])    Per new patient packet- [facility]    Low oxygen saturation    Per new patient packet- [place]    Lung cancer [facility])    Per new patient packet- [facility]    On home oxygen therapy    Per Records received from [facility]   Pneumonia    Per new patient packet- [facility]     Patient Active Problem List   Diagnosis Date Noted   Palliative care patient 09/09/2021   Aortic insufficiency with aortic stenosis 06/21/2019   Precordial pain 06/21/2019   Atrial fibrillation ([place]) 06/21/2019   Hyperlipidemia 06/21/2019    Past Surgical History:  Procedure Laterality Date   PARTIAL HYSTERECTOMY     Still with ovaries, Per new patient packet- [facility]    RIGHT/LEFT HEART CATH AND CORONARY ANGIOGRAPHY N/A 06/21/2019   Procedure: RIGHT/LEFT HEART CATH AND CORONARY ANGIOGRAPHY;  Surgeon: [name], [name], MD;  Location: [place] [facility];  Service: Cardiovascular;  Laterality: N/A;    OB History   No obstetric history on file.      Home Medications    Prior to Admission medications    Medication Sig Start Date End Date Taking? Authorizing Provider  predniSONE (STERAPRED UNI-PAK 21 TAB) 10 MG (21) TBPK tablet Take by mouth daily. Take 6 tabs by mouth daily  for 2 days, then 5 tabs for 2 days, then 4 tabs for 2 days, then 3 tabs for 2 days, 2 tabs for 2 days, then 1 tab by mouth daily for 2 days 11/26/21  Yes Marney Setting, NP  acetaminophen-codeine (TYLENOL #3) 300-30 MG tablet Take 1 tablet by mouth every 6 (six) hours as needed for moderate pain. 11/17/21   [name], NP  Albuterol Sulfate (PROAIR RESPICLICK) 220 (90 Base) MCG/ACT AEPB Inhale 1 puff into the lungs daily as needed (For shortness of breath). 10/06/20 10/06/21  [provider]  apixaban (ELIQUIS) 5 MG TABS tablet Take 1 tablet (5 mg total) by mouth 2 (two) times daily. 09/14/21   Deberah Pelton, NP  atorvastatin (LIPITOR) 20 MG tablet Take 1 tablet (20 mg total) by mouth daily. 09/14/21   Deberah Pelton, NP  Cholecalciferol 50 MCG (2000 UT) TABS Take 2,000 Units by mouth daily at 8 pm.     [provider]  furosemide (LASIX) 20 MG tablet Take 1 tablet (20 mg  total) by mouth daily. 09/14/21   Deberah Pelton, NP  OXYGEN Inhale 3 L into the lungs at bedtime.     [provider]  spironolactone (ALDACTONE) 25 MG tablet Take 1 tablet (25 mg total) by mouth daily. 08/31/21   [name], MD  vitamin B-12 (CYANOCOBALAMIN) 1000 MCG tablet Take 1,000 mcg by mouth daily.    [provider]  rivaroxaban (XARELTO) 20 MG TABS tablet Take 1 tablet (20 mg total) by mouth daily with supper. 06/26/20 10/19/20  [name], MD    Family History Family History  Problem Relation Age of Onset   Alzheimer's disease Mother    Alzheimer's disease Father     Social History Social History   Tobacco Use   Smoking status: Never   Smokeless tobacco: Never  Vaping Use   Vaping Use: Never used  Substance Use Topics   Alcohol use: Never   Drug use: Never      Allergies   Patient has no known allergies.   Review of Systems Review of Systems  Constitutional: Negative.  Negative for fatigue and fever.  HENT: Negative.    Respiratory:  Positive for shortness of breath and wheezing.        Patient has chronic shortness of breath due to lung cancer.  Patient is on portable oxygen at home  Cardiovascular: Negative.   Gastrointestinal: Negative.   Genitourinary: Negative.   Neurological:        Patient is alert to person and place.  Has a history of dementia.  Poor historian family at bedside    Physical Exam Triage Vital Signs ED Triage Vitals  Enc Vitals Group     BP 11/26/21 1348 130/64     Pulse Rate 11/26/21 1348 (!) 56     Resp 11/26/21 1348 18     Temp 11/26/21 1348 98.8 F (37.1 C)     Temp Source 11/26/21 1348 Oral     SpO2 11/26/21 1348 92 %     Weight --      Height --      Head Circumference --      Peak Flow --      Pain Score 11/26/21 1350 0     Pain Loc --      Pain Edu? --      Excl. in [employer]? --    No data found.  Updated Vital Signs BP 130/64 (BP Location: Right Arm)    Pulse (!) 56    Temp 98.8 F (37.1 C) (Oral)    Resp 18    SpO2 92%   Visual Acuity Right Eye Distance:   Left Eye Distance:   Bilateral Distance:    Right Eye Near:   Left Eye Near:    Bilateral Near:     Physical Exam Constitutional:      Appearance: Normal appearance.  HENT:     Nose: Nose normal.  Cardiovascular:     Rate and Rhythm: Normal rate.  Pulmonary:     Effort: Prolonged expiration present. No accessory muscle usage.     Breath sounds: Decreased air movement present. Examination of the right-lower field reveals decreased breath sounds. Examination of the left-lower field reveals decreased breath sounds. Decreased breath sounds and wheezing present.  Abdominal:     General: Abdomen is flat.  Neurological:     Mental Status: She is alert. Mental status is at baseline.     UC Treatments / Results  Labs (all  labs ordered are listed, but only abnormal results are displayed) Labs Reviewed  RESP PANEL BY RT-PCR (FLU A&B, COVID) [id number]    EKG   Radiology No results found.  Procedures Procedures (including critical care time)  Medications Ordered in UC Medications - No data to display  Initial Impression / Assessment and Plan / UC Course  I have reviewed the triage vital signs and the nursing notes.  Pertinent labs & imaging results that were available during my care of the patient were reviewed by me and considered in my medical decision making (see chart for details).     Discussed with family members that patient begins to have increased shortness of breath more than her normal and after taking steroid and using an at home albuterol does not help patient needs to be seen in the emergency room. We will send off her COVID test which can take 6 to 48 hours to return check her MyChart for results Due to patient's medications and will interfere with the antivirals Use the at home oxygen that you have as needed Final Clinical Impressions(s) / UC Diagnoses   Final diagnoses:  COVID     Discharge Instructions      Discussed with family members that patient begins to have increased shortness of breath more than her normal and after taking steroid and using an at home albuterol does not help patient needs to be seen in the emergency room. We will send off her COVID test which can take 6 to 48 hours to return check her MyChart for results Due to patient's medications and will interfere with the antivirals Use the at home oxygen that you have as needed      ED Prescriptions     Medication Sig Dispense Auth. Provider   predniSONE (STERAPRED UNI-PAK 21 TAB) 10 MG (21) TBPK tablet Take by mouth daily. Take 6 tabs by mouth daily  for 2 days, then 5 tabs for 2 days, then 4 tabs for 2 days, then 3 tabs for 2 days, 2 tabs for 2 days, then 1 tab by mouth daily for 2 days 42 tablet Marney Setting, NP      PDMP not reviewed this encounter.   Marney Setting, NP 11/26/21 1425

## 2021-11-26 NOTE — ED Triage Notes (Signed)
Pt presents after + COVID test at home.  Had exposure in family.  Had HA and body aches yesterday but feels better.  Pt's O2 and pulse at intake are her baseline per family.  Pt supposed to be on O2 but only uses it at night.  Has stage 4 lung cancer.

## 2021-11-26 NOTE — Telephone Encounter (Signed)
Summary: COVID positive-cough, headache, body aches   Sonia, pt daughter called in stated her mother tested positive for COVID today she had taken a test yesterday and it was negative.  Stated her symptoms are cough, headache, body aches  Pt daughter is concerned as pt has stage 4 lung cancer.     Seeking clinical advice.     Attempted to contact daughter- no answer- left message.  Interpreter: [phone] Attempted to call patient- no answer- left VM to call office

## 2021-11-26 NOTE — Telephone Encounter (Signed)
°  Chief Complaint: COVID Symptoms: body aches and headache that has went away Frequency: 2 days Pertinent Negatives: Patient denies fever, cough, SOB Disposition: [] ED /[] Urgent Care (no appt availability in office) / [] Appointment(In office/virtual)/ [x]  [facility]/ [] Home Care/ [] Refused Recommended Disposition /[] [facility]/ []  Follow-up with PCP Additional Notes: pt is considered high risk, daughter states tested yesterday and was negative but tested this morning positive. [facility] to confirm. Advised Sonia since pt is high risk with age and lung CA wanted to schedule appt. Reset Mychart password and gave directions how to perform virtual visit.    Reason for Disposition  [1] HIGH RISK for severe COVID complications (e.g., weak immune system, age > 63 years, obesity with BMI > 25, pregnant, chronic lung disease or other chronic medical condition) AND [2] COVID symptoms (e.g., cough, fever)  (Exceptions: Already seen by PCP and no new or worsening symptoms.)  Answer Assessment - Initial Assessment Questions 1. COVID-19 DIAGNOSIS: "Who made your COVID-19 diagnosis?" "Was it confirmed by a positive lab test or self-test?" If not diagnosed by a doctor (or NP/PA), ask "Are there lots of cases (community spread) where you live?" Note: See public health department website, if unsure.     Home test today 3. ONSET: "When did the COVID-19 symptoms start?"      Wednesday 5. COUGH: "Do you have a cough?" If Yes, ask: "How bad is the cough?"       No 6. FEVER: "Do you have a fever?" If Yes, ask: "What is your temperature, how was it measured, and when did it start?"     No 7. RESPIRATORY STATUS: "Describe your breathing?" (e.g., shortness of breath, wheezing, unable to speak)      No 8. BETTER-SAME-WORSE: "Are you getting better, staying the same or getting worse compared to yesterday?"  If getting worse, ask, "In what  way?"     better 9. HIGH RISK DISEASE: "Do you have any chronic medical problems?" (e.g., asthma, heart or lung disease, weak immune system, obesity, etc.)     Lung cancer 10. VACCINE: "Have you had the COVID-19 vaccine?" If Yes, ask: "Which one, how many shots, when did you get it?"       yes 11. BOOSTER: "Have you received your COVID-19 booster?" If Yes, ask: "Which one and when did you get it?"       No 13. OTHER SYMPTOMS: "Do you have any other symptoms?"  (e.g., chills, fatigue, headache, loss of smell or taste, muscle pain, sore throat)       Headache, body aches  Protocols used: Coronavirus (COVID-19) Diagnosed or Suspected-A-AH

## 2021-11-26 NOTE — Telephone Encounter (Signed)
Requested medication (s) are due for refill today:   Provider to review  Requested medication (s) are on the active medication list:   Yes  Future visit scheduled:   No   Last ordered: 11/17/2021 #60, 0 refills  Returned because this is a non delegated refill.   Requested Prescriptions  Pending Prescriptions Disp Refills   acetaminophen-codeine (TYLENOL #3) 300-30 MG tablet 60 tablet 0    Sig: Take 1 tablet by mouth every 6 (six) hours as needed for moderate pain.     Not Delegated - Analgesics:  Opioid Agonist Combinations 2 Failed - 11/26/2021  1:48 PM      Failed - This refill cannot be delegated      Failed - Urine Drug Screen completed in last 360 days      Passed - Cr in normal range and within 360 days    Creat  Date Value Ref Range Status  04/01/2019 0.65 0.60 - 0.93 mg/dL Final    Comment:    For patients >26 years of age, the reference limit for Creatinine is approximately 13% higher for people identified as African-American. .    Creatinine, Ser  Date Value Ref Range Status  02/18/2021 0.67 0.44 - 1.00 mg/dL Final          Passed - eGFR is 10 or above and within 360 days    GFR, Est African American  Date Value Ref Range Status  04/01/2019 99 > OR = 60 mL/min/1.33m Final   GFR calc Af Amer  Date Value Ref Range Status  10/13/2020 86 >59 mL/min/1.73 Final    Comment:    **In accordance with recommendations from the NKF-ASN Task force,**   [facility] is in the process of updating its eGFR calculation to the   2021 CKD-EPI creatinine equation that estimates kidney function   without a race variable.    GFR, Est Non African American  Date Value Ref Range Status  04/01/2019 86 > OR = 60 mL/min/1.775mFinal   GFR, Estimated  Date Value Ref Range Status  02/18/2021 >60 >60 mL/min Final    Comment:    (NOTE) Calculated using the CKD-EPI Creatinine Equation (2021)           Passed - Patient is not pregnant      Passed - Valid encounter within last 3  months    Recent Outpatient Visits           2 months ago Bilateral low back pain with sciatica, sciatica laterality unspecified, unspecified chronicity   [facility]   4 months ago Encounter for [employer] wellness exam   [facility]   8 months ago Dizziness and giddiness   [facility]   1 year ago Atrial fibrillation, unspecified type ([place]  [facility]ott RakesMD   1 year ago Encounter for medical examination to establish care   [facility]

## 2021-11-26 NOTE — Progress Notes (Signed)
[place]   Sent to [facility] for assessment and eval for COVID in setting of lung cancer  Patient acknowledged agreement and understanding of the plan.

## 2021-11-26 NOTE — Telephone Encounter (Signed)
Called patient felt she needed to call oncologist or go straight to Emergency Room for evaluation. Reviewed chart patient was seen in ED and tx with steroids advised if increased shortness of breath or respiratory distress rtn

## 2021-11-27 LAB — COVID-19, FLU A+B AND RSV
Influenza A, NAA: NOT DETECTED
Influenza B, NAA: NOT DETECTED
RSV, NAA: NOT DETECTED
SARS-CoV-2, NAA: DETECTED — AB

## 2021-12-05 IMAGING — CT CT HEART MORP W/ CTA COR W/ SCORE W/ CA W/CM &/OR W/O CM
2 of 7 series · 11 of 20 positions shown, 13 images · IV contrast (APPLIED)
Comparison: None.
COMPARISON: None.

Addendum:
EXAM:
OVER-READ INTERPRETATION  CT CHEST

The following report is an over-read performed by radiologist Dr.
Apoli Tufan [REDACTED] on 10/19/2020. This
over-read does not include interpretation of cardiac or coronary
anatomy or pathology. The coronary calcium score/coronary CTA
interpretation by the cardiologist is attached.
CLINICAL DATA: Pre-op transcatheter aortic valve replacement (TAVR)
Severe AI, Moderate-severe AS.
Cardiac TAVR CT
TECHNIQUE: The patient was scanned on a Siemens Force [REDACTED]ice scanner. A 120
kV retrospective scan was triggered in the descending thoracic aorta
at 111 HU's. Gantry rotation speed was 270 msecs and collimation was
.9 mm. The 3D data set was reconstructed in 5% intervals of the R-R
cycle. Systolic and diastolic phases were analyzed on a dedicated
work station using MPR, MIP and VRT modes. The patient received 80mL
OMNIPAQUE IOHEXOL 350 MG/ML SOLN of contrast.

[Series 6: 0-90% · axial · 0.39mm/px · z∈[+1282,+1422]mm · 5 of 3480 slices shown]
[im 580/3480  vessel]
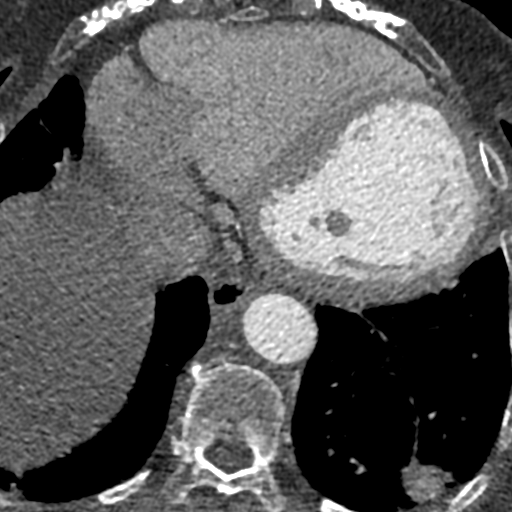
[im 1160/3480  vessel]
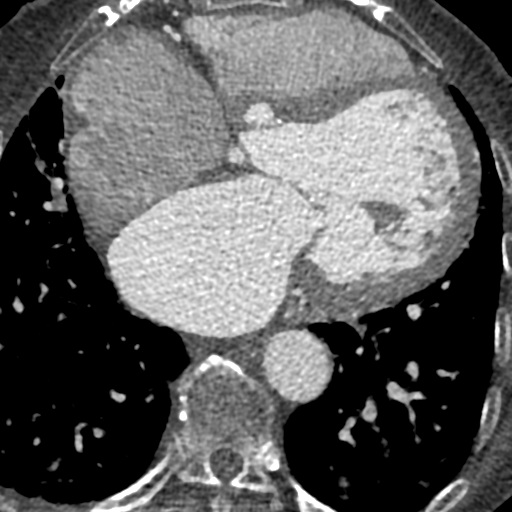
[im 1740/3480  vessel]
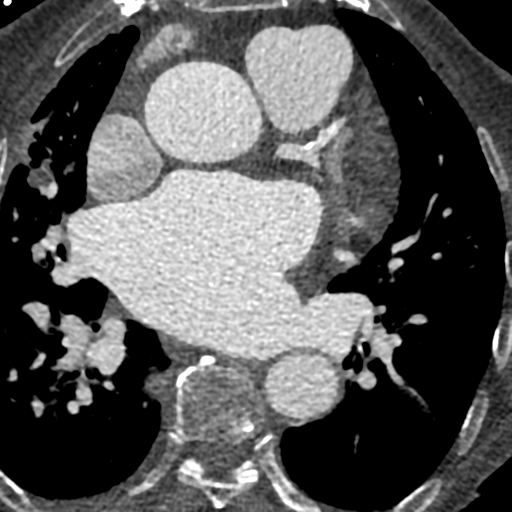
[im 2320/3480  vessel]
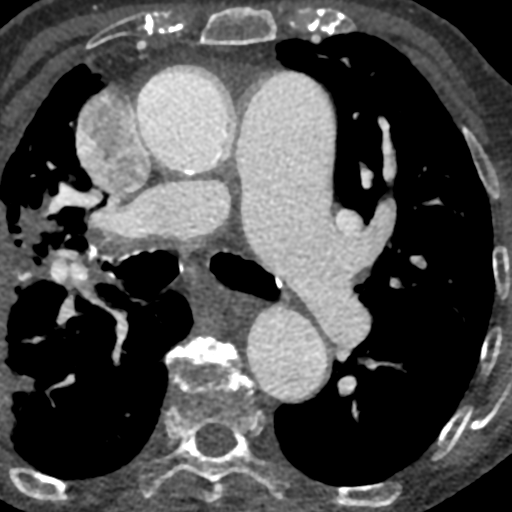
[im 2900/3480  vessel]
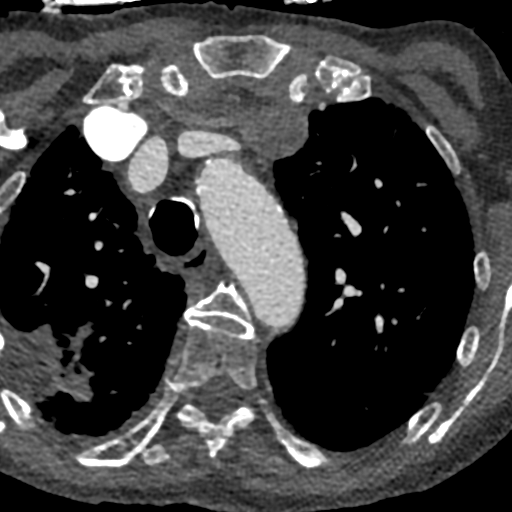

[Series 7: 5-95% · axial · 0.39mm/px · z∈[+1278,+1427]mm · 6 of 3480 slices shown, 8 images]
[im 498/3480  vessel]
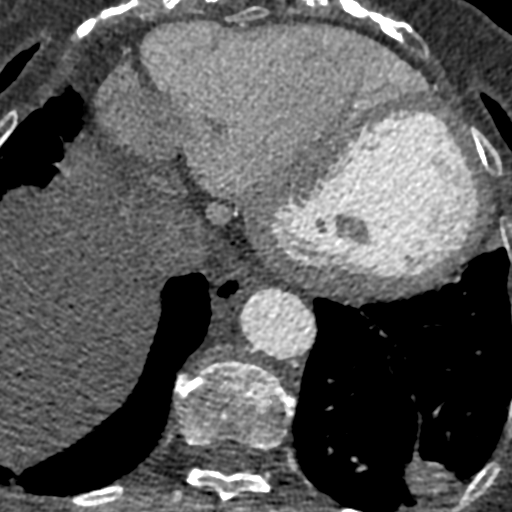
[im 498/3480  lung]
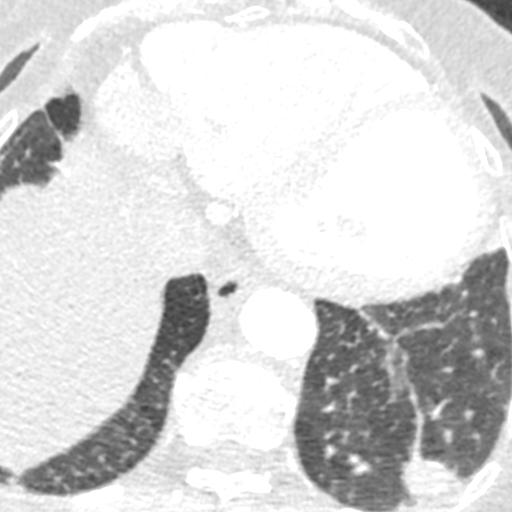
[im 995/3480  vessel]
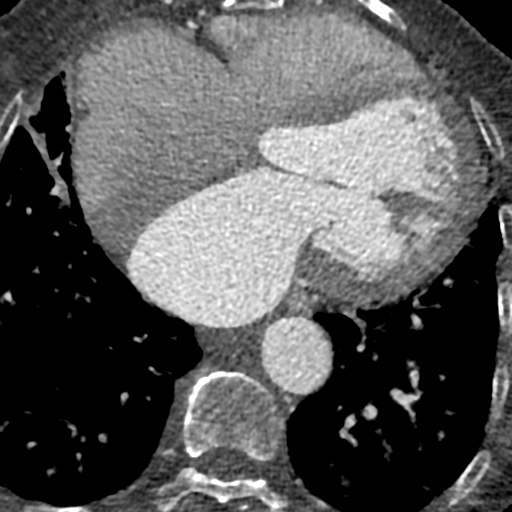
[im 1492/3480  vessel]
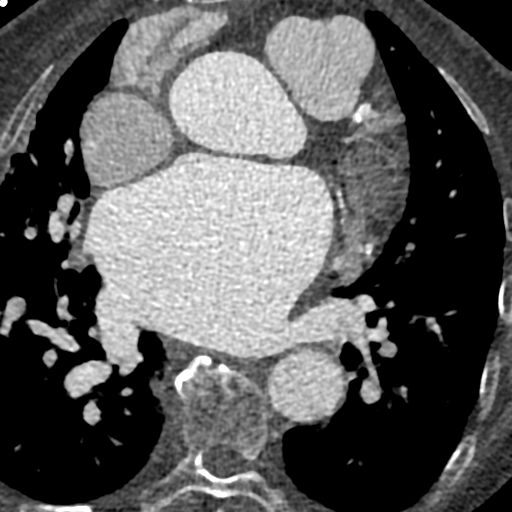
[im 1989/3480  vessel]
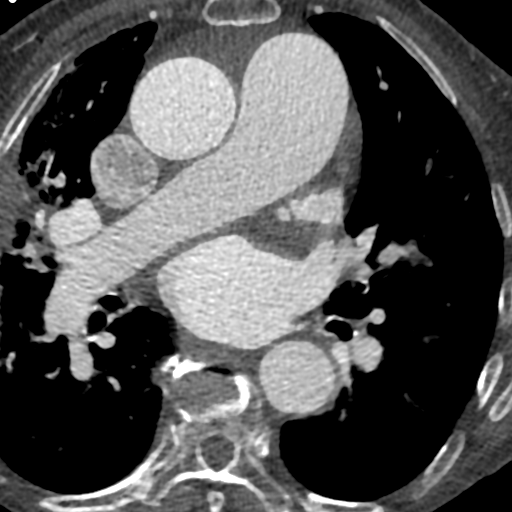
[im 2486/3480  vessel]
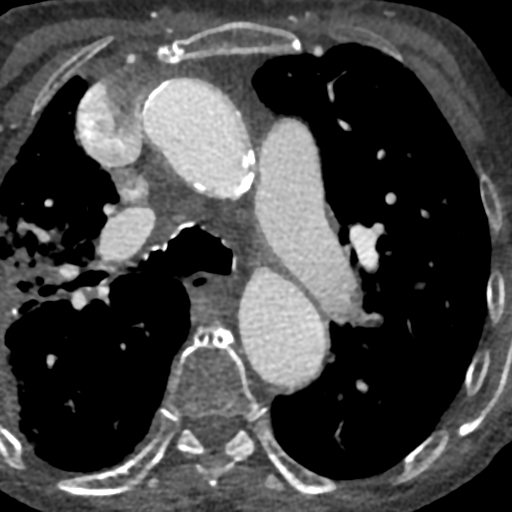
[im 2486/3480  lung]
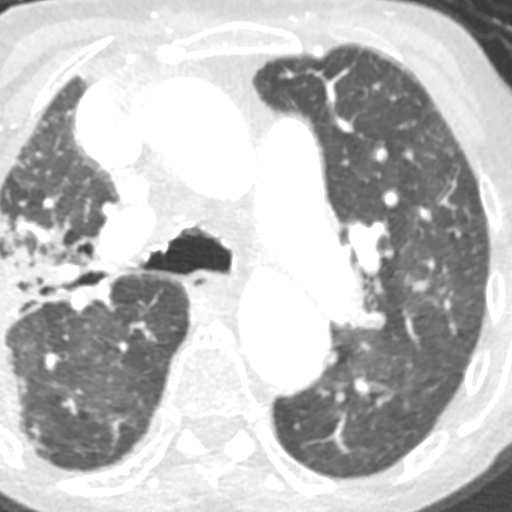
[im 2983/3480  vessel]
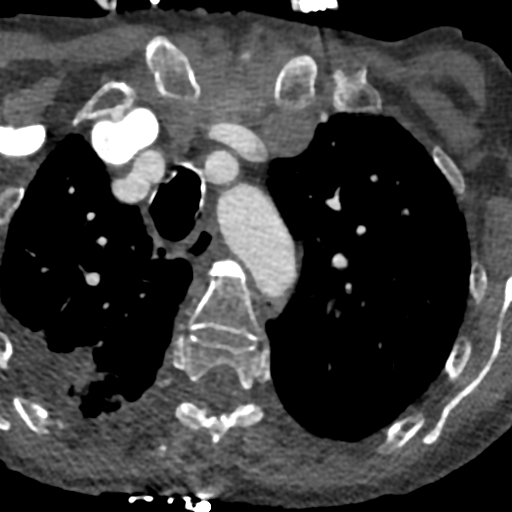

[11 of 20 positions shown; findings below may reference images not displayed]

FINDINGS: Extracardiac findings will be described separately under dictation
for contemporaneously obtained CTA chest, abdomen and pelvis.
IMPRESSION: Please see separate dictation for contemporaneously obtained CTA
chest, abdomen and pelvis 10/15/2020 for full description of
relevant extracardiac findings.
FINDINGS: Aortic Valve: Tricuspid aortic valve. Moderately reduced cusp
separation, with best preserved motion of the left coronary cusp.
Incomplete cusp coaptation in diastole. Severely thickened, Severely
calcified aortic valve cusps.

AV calcium score: 6264

Virtual Basal Annulus Measurements:

Maximum/Minimum Diameter: 30.8 x 24.1 mm

Perimeter: 84.9 mm

Area: 541 mm2

No significant LVOT calcifications.

Based on these measurements, the annulus would be suitable for a 26
mm Sapien 3 valve.

Sinus of Valsalva Measurements:

Non-coronary:  35 mm

Right - coronary:  33 mm

Left - coronary:  35 mm

Sinus of Valsalva Height:

Left: 21.5 mm

Right: 22 mm

Aorta: Common origin of brachiocephalic and left common carotid
arteries.

Sinotubular Junction:  32 mm

Ascending Thoracic Aorta:  40 mm

Aortic Arch: 29 mm

Descending Thoracic Aorta:  30 mm

Coronary Artery Height above Annulus:

Left Main: 15 mm

Right Coronary: 19 mm

Coronary Arteries: This exam was not performed to evaluate coronary
artery stenosis.

The right coronary artery was previously felt to be chronically
occluded on cath, however on this exam appears patent with no
obvious ostial stenosis.

Coronary artery calcifications in the left system. Left dominant
circulation.

Optimum Fluoroscopic Angle for Delivery: LAO 21, BIDI 21

Severe dilation of main pulmonary artery, 44 mm.

Severe left atrial dilation.  Moderate right atrial dilation.

Cannot exclude LA appendage thrombus. Patient is known to be in
permanent atrial fibrillation.
IMPRESSION: 1. Tricuspid aortic valve. Moderately reduced cusp separation, with
best preserved motion of the left coronary cusp. Incomplete cusp
coaptation in diastole. Severely thickened, Severely calcified
aortic valve cusps.

2.  AV calcium score: 6264

3. Annulus Area: 541 mm2. Based on these measurements, valve suited
for 26 mm Sapien 3 THV.

4.  Adequate coronary artery heights from annulus.

5. The right coronary artery was previously felt to be chronically
occluded on coronary angiography, however on this exam appears
patent with no obvious ostial stenosis.

6. Severe dilation of main pulmonary artery, 44 mm, consistent with
known pulmonary hypertension.

7. Cannot exclude LA appendage thrombus.

8. Optimum Fluoroscopic Angle for Delivery: LAO 21, BIDI 21

*** End of Addendum ***
EXAM:
OVER-READ INTERPRETATION  CT CHEST

The following report is an over-read performed by radiologist Dr.
Apoli Tufan [REDACTED] on 10/19/2020. This
over-read does not include interpretation of cardiac or coronary
anatomy or pathology. The coronary calcium score/coronary CTA
interpretation by the cardiologist is attached.
FINDINGS: Extracardiac findings will be described separately under dictation
for contemporaneously obtained CTA chest, abdomen and pelvis.
IMPRESSION: Please see separate dictation for contemporaneously obtained CTA
chest, abdomen and pelvis 10/15/2020 for full description of
relevant extracardiac findings.

## 2021-12-05 IMAGING — CT CT ANGIO CHEST
2 of 7 series · 14 of 36 positions shown · IV contrast (APPLIED)
Comparison: Chest CT 12/27/2019. No prior CT the abdomen and
pelvis.

CLINICAL DATA: 79-year-old female with history of severe aortic
stenosis. Preprocedural study prior to potential transcatheter
aortic valve replacement (TAVR) procedure.

EXAM:
CT ANGIOGRAPHY CHEST, ABDOMEN AND PELVIS
TECHNIQUE: Multidetector CT imaging through the chest, abdomen and pelvis was
performed using the standard protocol during bolus administration of
intravenous contrast. Multiplanar reconstructed images and MIPs were
obtained and reviewed to evaluate the vascular anatomy.
CONTRAST:  80mL OMNIPAQUE IOHEXOL 350 MG/ML SOLN

[Series 3: ax thins · axial · 0.59mm/px · z∈[+946,+1445]mm · 13 of 577 slices shown]
[im 39/577  lung]
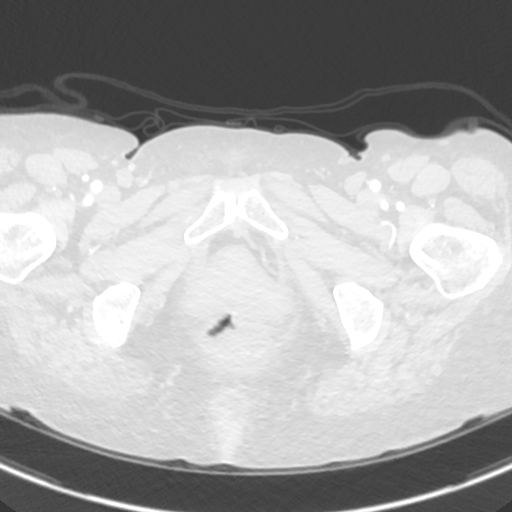
[im 77/577  mediastinal]
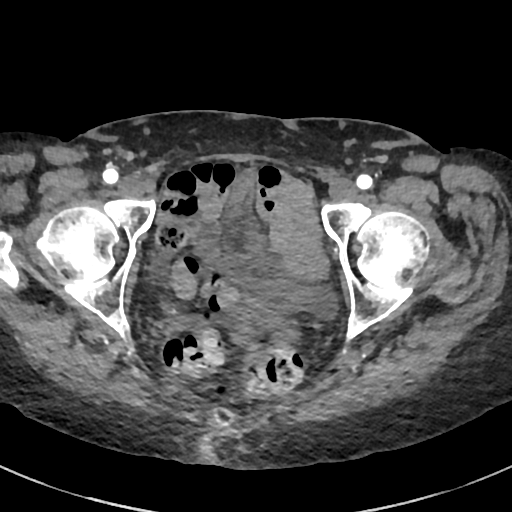
[im 116/577  lung]
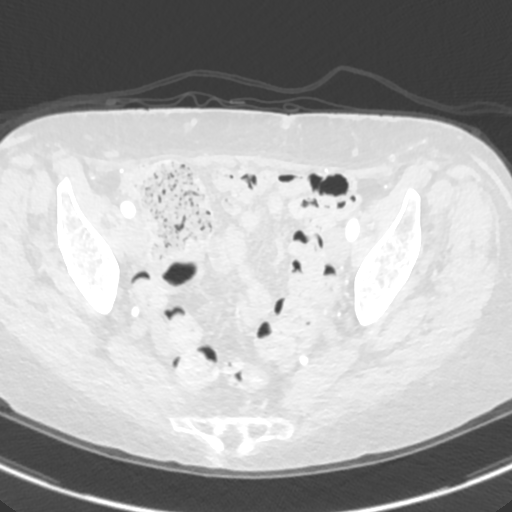
[im 154/577  mediastinal]
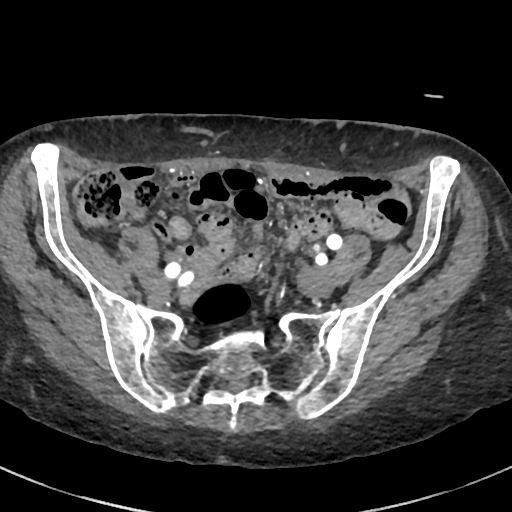
[im 193/577  lung]
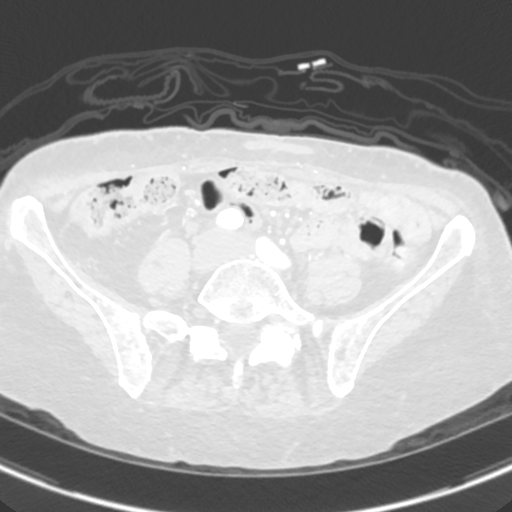
[im 231/577  mediastinal]
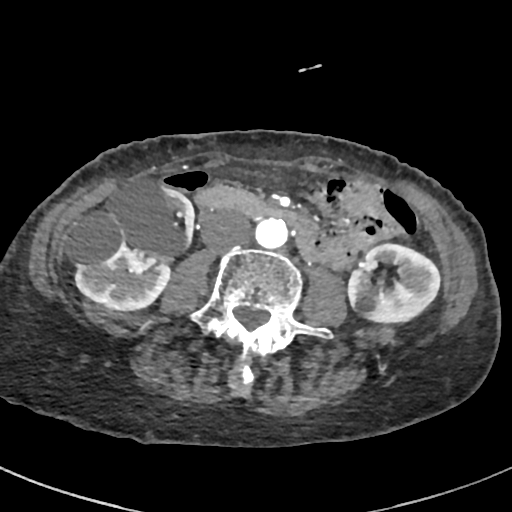
[im 308/577  lung]
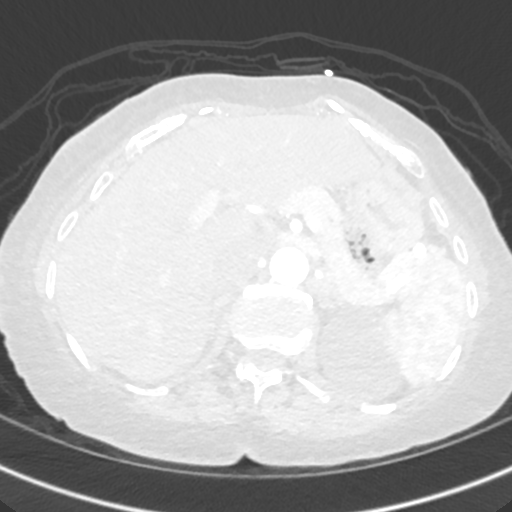
[im 346/577  mediastinal]
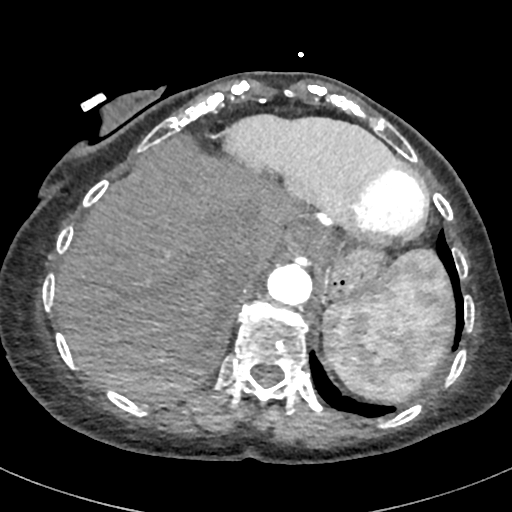
[im 385/577  lung]
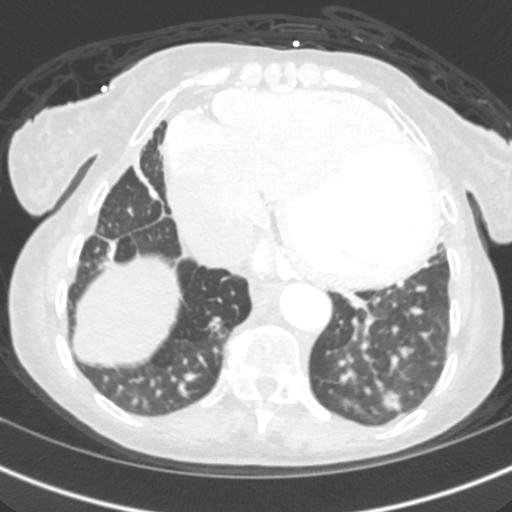
[im 423/577  mediastinal]
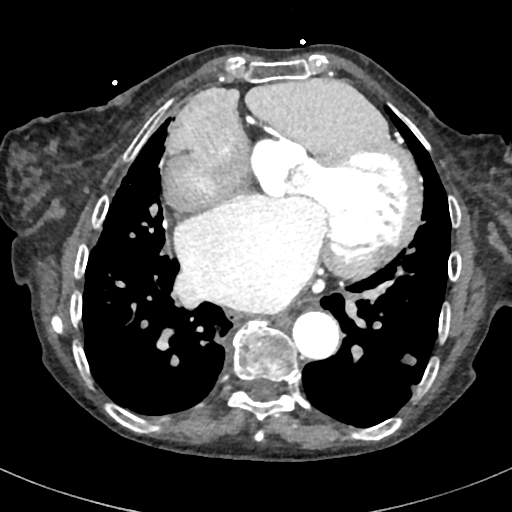
[im 461/577  lung]
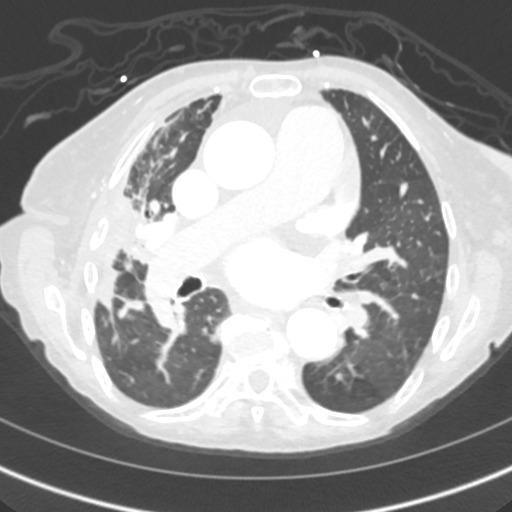
[im 500/577  mediastinal]
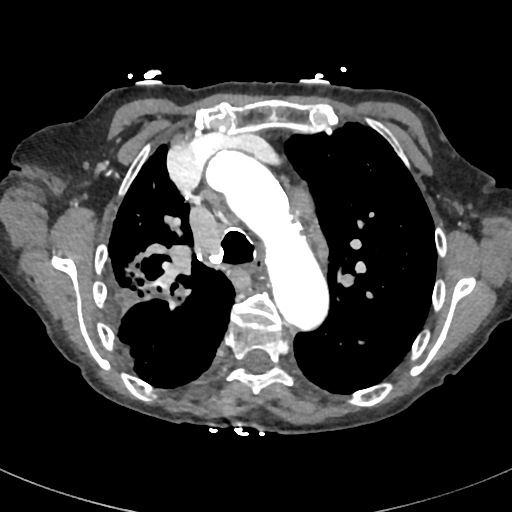
[im 538/577  lung]
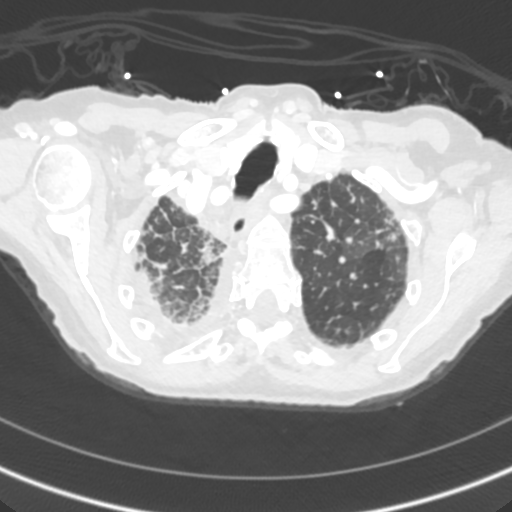

[Series 6: cor · coronal · 0.77mm/px · 1 of 152 slices shown]
[im 76/152  mediastinal]
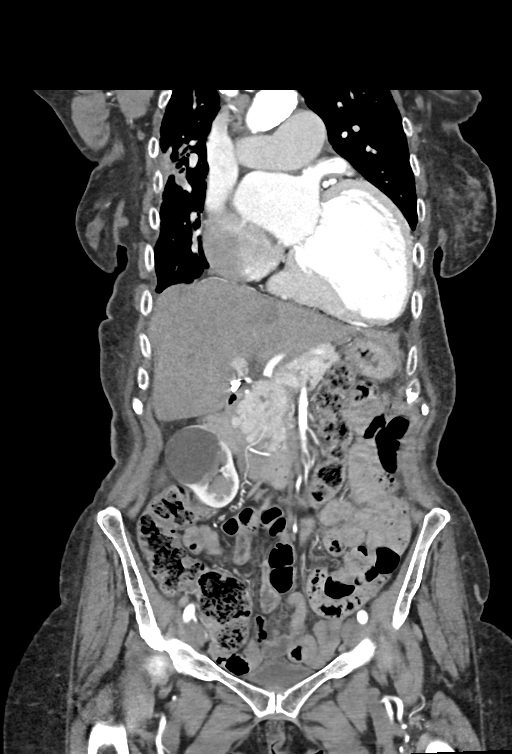

[14 of 36 positions shown; findings below may reference images not displayed]

FINDINGS: CTA CHEST FINDINGS

Cardiovascular: Heart size is severely enlarged. There is no
significant pericardial fluid, thickening or pericardial
calcification. There is aortic atherosclerosis, as well as
atherosclerosis of the great vessels of the mediastinum and the
coronary arteries, including calcified atherosclerotic plaque in the
left main, left anterior descending and left circumflex coronary
arteries. Severe thickening calcification of the aortic valve. Mild
calcifications of the mitral valve and annulus. Mild ectasia of the
ascending thoracic aorta (4.0 cm in diameter). Severe dilatation of
the pulmonic trunk (4.4 cm in diameter). Large filling defect in the
tip of the left atrial appendage, indicative of left atrial
appendage thrombus.

Mediastinum/Lymph Nodes: No pathologically enlarged mediastinal or
hilar lymph nodes. Esophagus is unremarkable in appearance. No
axillary lymphadenopathy.

Lungs/Pleura: Multiple pulmonary nodules are noted throughout the
lungs bilaterally, largest of which is in the posterior aspect of
the left lower lobe (axial image 68 of series 5 and sagittal image
165 of series 7) measuring 2.3 x 2.1 x 2.7 cm. The largest pulmonary
nodule in the right lung is in the right lower lobe (axial image 57
of series 5) measuring 2.4 x 1.6 cm with some internal areas of
cavitation. There is also extensive mass-like architectural
distortion throughout the periphery of the right upper lobe with
internal air bronchograms, likely to represent areas of chronic post
infectious or inflammatory scarring. Patchy areas of ground-glass
attenuation and interlobular septal thickening scattered throughout
the lungs bilaterally, suggesting a background of mild interstitial
pulmonary edema.

Musculoskeletal/Soft Tissues: Old partially healed fractures of the
lateral aspect of the right fourth and fifth ribs. There are no
aggressive appearing lytic or blastic lesions noted in the
visualized portions of the skeleton.

CTA ABDOMEN AND PELVIS FINDINGS

Hepatobiliary: No suspicious cystic or solid hepatic lesions. No
intra or extrahepatic biliary ductal dilatation. Status post
cholecystectomy.

Pancreas: No pancreatic mass. No pancreatic ductal dilatation. No
pancreatic or peripancreatic fluid collections or inflammatory
changes.

Spleen: Unremarkable.

Adrenals/Urinary Tract: Multiple low-attenuation nonenhancing
lesions in both kidneys compatible with simple cysts measuring up to
5.6 x 4.0 cm in the interpolar region of the right kidney and 5.9 x
5.4 cm in the upper pole the left kidney. Other subcentimeter
low-attenuation lesions in the kidneys are too small to definitively
characterize. No definite suspicious renal lesions are identified.
No hydroureteronephrosis. Urinary bladder is normal in appearance.
Bilateral adrenal glands are normal in appearance.

Stomach/Bowel: Normal appearance of the stomach. No pathologic
dilatation of small bowel or colon. The appendix is not confidently
identified and may be surgically absent. Regardless, there are no
inflammatory changes noted adjacent to the cecum to suggest the
presence of an acute appendicitis at this time.

Vascular/Lymphatic: Aortic atherosclerosis, without evidence of
aneurysm or dissection in the abdominal or pelvic vasculature.
Vascular findings and measurements pertinent to potential TAVR
procedure, as detailed below. No lymphadenopathy noted in the
abdomen or pelvis.

Reproductive: Status post hysterectomy. Ovaries are not confidently
identified may be surgically absent or atrophic.

Other: No significant volume of ascites.  No pneumoperitoneum.

Musculoskeletal: There are no aggressive appearing lytic or blastic
lesions noted in the visualized portions of the skeleton.

VASCULAR MEASUREMENTS PERTINENT TO TAVR:

AORTA:

Minimal Aortic Siameter-MF x 17 mm

Severity of Aortic Calcification-moderate

RIGHT PELVIS:

Right Common Iliac Artery -

Minimal Eiameter-3.2 x 8.8 mm

Tortuosity-mild

Calcification-moderate to severe

Right External Iliac Artery -

Minimal 3iameter-0.C x 7.0 mm

Tortuosity-moderate

Calcification-none

Right Common Femoral Artery -

Minimal Oiameter-U.J x 8.0 mm

Tortuosity-mild

Calcification-none

LEFT PELVIS:

Left Common Iliac Artery -

Minimal 1iameter-OX.B x 8.9 mm

Tortuosity-mild

Calcification-moderate

Left External Iliac Artery -

Minimal Iiameter-Z.T x 7.1 mm

Tortuosity-mild-to-moderate

Calcification-none

Left Common Femoral Artery -

Minimal Eiameter-T.T x 7.3 mm

Tortuosity-mild

Calcification-none

Review of the MIP images confirms the above findings.
IMPRESSION: 1. Vascular findings and measurements pertinent to potential TAVR
procedure, as detailed above.
2. Severe thickening calcification of the aortic valve, compatible
with reported clinical history of severe aortic stenosis.
3. Large filling defect in the tip of the left atrial appendage
compatible with left atrial appendage thrombus.
4. Numerous pulmonary nodules scattered throughout the lungs
bilaterally, highly concerning for metastatic disease. Any one of
these nodules could represent a primary bronchogenic malignancy.
Further evaluation with nonemergent PET-CT is recommended in the
near future to better evaluate these findings.
5. Probable area of chronic post infectious or inflammatory scarring
in the periphery of the right lung. The possibility that a primary
bronchogenic neoplasm exists within this area of apparent scarring
is not excluded. Attention at time of forthcoming PET-CT is
recommended.
6. No definite signs of metastatic disease confidently identified in
the abdomen or pelvis.
7. Severe dilatation of the pulmonic trunk (4.4 cm in diameter),
concerning for pulmonary arterial hypertension.
8. Aortic atherosclerosis, in addition to left main and 2 vessel
coronary artery disease. There is also ectasia of the ascending
thoracic aorta (4.0 cm in diameter). Recommend annual imaging
followup by CTA or MRA. This recommendation follows 1757
ACCF/AHA/AATS/ACR/ASA/SCA/RASI/AUAD/NEMIO/JCMBA Guidelines for the
Diagnosis and Management of Patients with Thoracic Aortic Disease.
Circulation. 1757; 121: E266-e369. Aortic aneurysm NOS
([id number].S).
9. Additional incidental findings, as above.
10. These results will be called to the ordering clinician or
representative by the Radiologist Assistant, and communication
documented in the PACS or [REDACTED].

## 2021-12-22 DIAGNOSIS — J9611 Chronic respiratory failure with hypoxia: Secondary | ICD-10-CM | POA: Diagnosis not present

## 2021-12-22 DIAGNOSIS — C3490 Malignant neoplasm of unspecified part of unspecified bronchus or lung: Secondary | ICD-10-CM | POA: Diagnosis not present

## 2022-01-06 ENCOUNTER — Other Ambulatory Visit: Payer: Self-pay

## 2022-01-06 ENCOUNTER — Other Ambulatory Visit: Payer: Medicare HMO | Admitting: Hospice

## 2022-01-06 NOTE — Progress Notes (Signed)
? ? ?Manufacturing engineer ?Community Palliative Care Consult Note ?Telephone: [phone]  ?Fax: [phone] ? ?PATIENT NAME: Valerie [name] ?DOB: 11-Oct-1941 ?MRN: [id number] ? ?PRIMARY CARE PROVIDER:   [name], NP ?[name], NP ?[address] ?[place],  [place] [address] ? ?REFERRING PROVIDER: [name], NP ?[name], NP ?[address] ?[place],  [place] [address] ? ?RESPONSIBLE PARTY:  Self/Dolinsky [phone] ?Valerie [name] is second Emergency contact: [phone] ?        ?Contact Information   ?  Name Relation Home Work Mobile  ?  Valerie [name], Valerie [name] Daughter     [phone]  ?  Valerie [name],Valerie [name] Daughter     [phone]  ?   ?  ? TELEHEALTH VISIT STATEMENT ?Due to the COVID-19 crisis, this visit was done via telemedicine from my office and it was initiated and consent by this patient and or family. Video-audio (telehealth) contact was unable to be done due to technical barriers from the patient?s side. ?I connected with patient OR PROXY by a telephone  and verified that I am speaking with the correct person. I discussed the limitations of evaluation and management by telemedicine. The patient expressed understanding and agreed to proceed.  ? ?Visit is to build trust and highlight Palliative Medicine as specialized medical care for people living with serious illness, aimed at facilitating better quality of life through symptoms relief, assisting with advance care planning and complex medical decision making. Valerie [name] are present with patient during visit. This is a follow up visit. ?  ?RECOMMENDATIONS/PLAN:  ? Advance Care Planning/Code Status: Patient is a DO NOT RESUSCITATE. ?  ?Goals of Care:Goals of care include to maximize quality of life and symptom management. Patient is not interested in any treatment for her lung cancer. MOST selections include no feeding tube, IV fluids/antibiotics as indicated, limited additional intervention. ?Patient is interested in hospice service in  the future. She will like end of life care in her home.  ?  ?Visit consisted of counseling and education dealing with the complex and emotionally intense issues of symptom management and palliative care in the setting of serious and potentially life-threatening illness. Valerie [name] faith help patient and  family to cope.  Palliative care team will continue to support patient, patient's family, and medical team. ?  ?3. Symptom management/Plan:  ?Weight loss: likely related to Lung CA. Height/Weight: 5 feet 4 inches/123 Ibs down from 125 Ib last month and 142 Ibs 3 months ago. Offer enough time and assistance during meals; offer 4-6 small meals. Encourage fruits and vegetable in smoothie forms. ?Lung CA: s/p radiation. Patient refuses further treatment.  Continue ongoing supportive care ?Dementia: memory loss/confusion at baseline. FAST 5, FLACC 0. ?Low back pain: Improved, pain medication last taken a week again. Managed with Tylenol with Codeine. Continue use of heating pad and activity as tolerated.  Balance of rest and performance activity.  ?Palliative will continue to monitor for symptom management/decline and make recommendations as needed. Return 2 months or prn. Encouraged to call provider sooner with any concerns.  ? CHIEF COMPLAINT: Palliative follow up visit ? ?HISTORY OF PRESENT ILLNESS: Follow-up visit for Valerie [name],  a 81 y.o. female with multiple medical problems including lung cancer for which patient refuses further treatment. Patient continues on weight loss, sarcopenia, despite good appetite.  History of Afib,  COVID-19 viral infection, Aortic insufficiency/stenosis- followed by Cardiologist. Patient with history of Alzheimer's Dementia, dizziness, constipation, Afib.  Today, patient denies pain/discomfort, in no respiratory distress  History obtained from review of EMR, discussion with primary team, family and/or patient. Records reviewed and summarized above. All 10 point systems reviewed and are  negative except as documented in history of present illness above ? ?Review and summarization of Epic records shows history from other than patient.  ? ?Palliative Care was asked to follow this patient o help address complex decision making in the context of advance care planning and goals of care clarification.  ? ? ?PERTINENT MEDICATIONS:  ?Outpatient Encounter Medications as of 01/06/2022  ?Medication Sig  ? acetaminophen-codeine (TYLENOL #3) 300-30 MG tablet Take 1 tablet by mouth every 6 (six) hours as needed for moderate pain.  ? Albuterol Sulfate (PROAIR RESPICLICK) 283 (90 Base) MCG/ACT AEPB Inhale 1 puff into the lungs daily as needed (For shortness of breath).  ? apixaban (ELIQUIS) 5 MG TABS tablet Take 1 tablet (5 mg total) by mouth 2 (two) times daily.  ? atorvastatin (LIPITOR) 20 MG tablet Take 1 tablet (20 mg total) by mouth daily.  ? Cholecalciferol 50 MCG (2000 UT) TABS Take 2,000 Units by mouth daily at 8 pm.   ? furosemide (LASIX) 20 MG tablet Take 1 tablet (20 mg total) by mouth daily.  ? OXYGEN Inhale 3 L into the lungs at bedtime.   ? predniSONE (STERAPRED UNI-PAK 21 TAB) 10 MG (21) TBPK tablet Take by mouth daily. Take 6 tabs by mouth daily  for 2 days, then 5 tabs for 2 days, then 4 tabs for 2 days, then 3 tabs for 2 days, 2 tabs for 2 days, then 1 tab by mouth daily for 2 days  ? spironolactone (ALDACTONE) 25 MG tablet Take 1 tablet (25 mg total) by mouth daily.  ? vitamin B-12 (CYANOCOBALAMIN) 1000 MCG tablet Take 1,000 mcg by mouth daily.  ? [DISCONTINUED] rivaroxaban (XARELTO) 20 MG TABS tablet Take 1 tablet (20 mg total) by mouth daily with supper.  ? ?No facility-administered encounter medications on file as of 01/06/2022.  ? ? ?HOSPICE ELIGIBILITY/DIAGNOSIS: TBD ? ?PAST MEDICAL HISTORY:  ?Past Medical History:  ?Diagnosis Date  ? Alzheimer disease ([place])   ? Per new patient packet- [facility]   ? Atrial fibrillation ([place]) 2006  ? Per [facility] from [facility]  ? Dementia ([place])   ? Per new  patient packet- [facility]   ? Low oxygen saturation   ? Per new patient packet- [facility]   ? Lung cancer ([place])   ? Per new patient packet- [facility]   ? On home oxygen therapy   ? Per Records received from [facility]  ? Pneumonia   ? Per new patient packet- [facility]   ? ? ?ALLERGIES: No Known Allergies   ? ?I spent 45 minutes providing this consultation; this includes time spent with patient/family, chart review and documentation. More than 50% of the time in this consultation was spent on counseling and coordinating communication  ? ?Thank you for the opportunity to participate in the care of Valerie [name] Please call our office at [phone] if we can be of additional assistance. ? ?Note: Portions of this note were generated with [employer]. Dictation errors may occur despite best attempts at proofreading. ? ?Teodoro Spray, NP ? ?  ?

## 2022-01-19 ENCOUNTER — Telehealth: Payer: Self-pay | Admitting: Internal Medicine

## 2022-01-19 ENCOUNTER — Other Ambulatory Visit: Payer: Self-pay

## 2022-01-19 DIAGNOSIS — I110 Hypertensive heart disease with heart failure: Secondary | ICD-10-CM

## 2022-01-19 MED ORDER — FUROSEMIDE 20 MG PO TABS: 20.0000 mg | ORAL_TABLET | Freq: Every day | ORAL | 0 refills | Status: DC

## 2022-01-19 MED ORDER — ATORVASTATIN CALCIUM 20 MG PO TABS: 20.0000 mg | ORAL_TABLET | Freq: Every day | ORAL | 0 refills | Status: AC

## 2022-01-19 MED ORDER — SPIRONOLACTONE 25 MG PO TABS: 25.0000 mg | ORAL_TABLET | Freq: Every day | ORAL | 0 refills | Status: DC

## 2022-01-19 NOTE — Telephone Encounter (Signed)
Pt c/o medication issue: ? ?1. Name of Medication: atorvastatin (LIPITOR) 20 MG tablet; furosemide (LASIX) 20 MG tablet; spironolactone (ALDACTONE) 25 MG tablet ? ?2. How are you currently taking this medication (dosage and times per day)? As written ? ?3. Are you having a reaction (difficulty breathing--STAT)? No  ? ?4. What is your medication issue? Patient needs new prescription sent in to [place], [place] for 90 supply for all ?

## 2022-01-19 NOTE — Telephone Encounter (Signed)
Spoke with pt's daughter. She states this service will give have her medications delivered to her versus having to go to the pharmacy. Prescription sent to new pharmacy per patient's daughter's request. Pt also added to see Denyse Amass for her 6 month follow up. Pt's daughter thanked me for calling her back.   ?

## 2022-01-19 NOTE — Progress Notes (Signed)
Prescription sent to new pharmacy per patient's daughter's request.  ? ?

## 2022-01-22 DIAGNOSIS — C3490 Malignant neoplasm of unspecified part of unspecified bronchus or lung: Secondary | ICD-10-CM | POA: Diagnosis not present

## 2022-01-22 DIAGNOSIS — J9611 Chronic respiratory failure with hypoxia: Secondary | ICD-10-CM | POA: Diagnosis not present

## 2022-02-14 ENCOUNTER — Other Ambulatory Visit: Payer: Self-pay | Admitting: General Practice

## 2022-02-14 DIAGNOSIS — I110 Hypertensive heart disease with heart failure: Secondary | ICD-10-CM

## 2022-02-21 DIAGNOSIS — C3490 Malignant neoplasm of unspecified part of unspecified bronchus or lung: Secondary | ICD-10-CM | POA: Diagnosis not present

## 2022-02-21 DIAGNOSIS — J9611 Chronic respiratory failure with hypoxia: Secondary | ICD-10-CM | POA: Diagnosis not present

## 2022-02-22 NOTE — Progress Notes (Signed)
? ?Cardiology Clinic Note  ? ?Patient Name: Valerie [name] ?Date of Encounter: 02/25/2022 ? ?Primary Care Provider:  [name], NP ?Primary Cardiologist:  [name], MD ? ?Patient Profile  ?  ?Valerie [name] 81 year old female presents the clinic today for follow-up evaluation of her atrial fibrillation and aortic stenosis. ? ?Past Medical History  ?  ?Past Medical History:  ?Diagnosis Date  ? Alzheimer disease ([place])   ? Per new patient packet- [facility]   ? Atrial fibrillation ([place]) 2006  ? Per Records received from [facility]  ? Dementia ([place])   ? Per new patient packet- [facility]   ? Low oxygen saturation   ? Per new patient packet- [facility]   ? Lung cancer ([place])   ? Per new patient packet- [facility]   ? On home oxygen therapy   ? Per Records received from [facility]  ? Pneumonia   ? Per new patient packet- [facility]   ? ?Past Surgical History:  ?Procedure Laterality Date  ? PARTIAL HYSTERECTOMY    ? Still with ovaries, Per new patient packet- [facility]   ? RIGHT/LEFT HEART CATH AND CORONARY ANGIOGRAPHY N/A 06/21/2019  ? Procedure: RIGHT/LEFT HEART CATH AND CORONARY ANGIOGRAPHY;  Surgeon: [name], [name], MD;  Location: [facility];  Service: Cardiovascular;  Laterality: N/A;  ? ? ?Allergies ? ?No Known Allergies ? ?History of Present Illness  ?  ?Valerie [name] has a PMH of severe aortic insufficiency, aortic stenosis, and persistent longstanding atrial fibrillation with slow ventricular response.  PMH also includes CHF, lung CA, OSA and dementia.  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 ? ?She was seen by Dr. Margaretann Loveless 10/21.  I discussed aortic valve replacement for her aortic valve disease.  It was felt that she would likely need PPM as well as TAVR procedure.  Patient reported that she was not inclined to have the procedure.  She discussed the procedure with her family.  Her family encouraged her to undergo TAVR evaluation and  the associated testing so that she may have a longer life span and better management of her symptoms.  The procedure was described.  Follow-up appointments with Dr. Burt Knack were scheduled.  She was noted to have chronic left axillary pain which was felt to be chronic angina.  She was not taking any anginal therapy at that time at her request. ? ?She followed up with Dr. Burt Knack 10/13/2020.  She presented with her 2 daughters.  She reported exertional dyspnea with low-level activity.  She also noted orthopnea but no PND.  She routinely slept on 2-3 pillows.  She occasionally chest discomfort.  She also reported occasional lightheadedness but no syncope or presyncope type symptoms.  It was noted that she had not had regular dental care and reported poor dentition.  TAVR procedure and PPM were discussed.  She was felt to be high risk for the procedure.  Nose recommended that she have follow-up CTA and undergo dental extraction. ? ?She presented to the clinic 09/14/21 for follow-up evaluation stated she felt okay.  Her daughter presented with her and reported that they were currently receiving treatment for stage IV lung cancer.  Her lung cancer had returned and had metastasized.  They did not wish to proceed with TAVR procedure.  She reported that her mother had  increased fatigue and had trouble falling asleep at night.  She was doing better  with the addition of pain medication.  Her blood pressures were well controlled.  She had been fairly sedentary at home.  She did report some dizziness with getting up fast. I have recommended that she increase her p.o. hydration and wear lower extremity support stockings.   Her  medications were continued and  follow-up was planned for 6 months. ? ?She presents to the clinic today for follow-up evaluation and states she feels fairly well.  She presents with her daughter who reports that she has had increased fatigue.  We reviewed her previous request for not proceeding with  TAVR/aggressive medical therapy.  She is currently not receiving any cancer treatment.  She reports that she is eating well.  She does not have any lower extremity swelling.  She denies irregular heartbeats.  She does note occasional brief sharp left-sided chest discomfort.  We used shared decision making to continue to use conservative measures for fluid management.  We will plan follow-up in 6 months.  She reports that she is using 3 L of oxygen at night with sleep and does not need oxygen through the day. ? ?Today she denies chest pain, increased shortness of breath, lower extremity edema, fatigue, palpitations, melena, hematuria, hemoptysis, diaphoresis, weakness, presyncope, syncope, orthopnea, and PND. ? ? ? ? ? ? ?Home Medications  ?  ?Prior to Admission medications   ?Medication Sig Start Date End Date Taking? Authorizing Provider  ?acetaminophen-codeine (TYLENOL #3) 300-30 MG tablet Take 1 tablet by mouth every 6 (six) hours as needed for moderate pain. 09/09/21   [name], NP  ?Albuterol Sulfate (PROAIR RESPICLICK) 956 (90 Base) MCG/ACT AEPB Inhale 1 puff into the lungs daily as needed (For shortness of breath). 10/06/20 10/06/21  [provider]  ?apixaban (ELIQUIS) 5 MG TABS tablet Take 1 tablet (5 mg total) by mouth 2 (two) times daily. 11/12/20   [name], MD  ?atorvastatin (LIPITOR) 20 MG tablet Take 1 tablet by mouth once daily 08/16/21   [name], NP  ?Cholecalciferol 50 MCG (2000 UT) TABS Take 2,000 Units by mouth daily at 8 pm.     [provider]  ?furosemide (LASIX) 20 MG tablet TAKE 1 TABLET BY MOUTH ONCE DAILY(APPOINTMENT REQUIRED FOR FUTURE REFILLS) 06/22/21   Fenton Foy, NP  ?Lidocaine 4 % PTCH Apply 1 patch topically daily. ?Patient not taking: Reported on 09/06/2021 09/03/21   Victorino Dike, FNP  ?OXYGEN Inhale 3 L into the lungs at bedtime.     [provider]  ?spironolactone (ALDACTONE) 25 MG tablet Take 1 tablet (25 mg  total) by mouth daily. 08/31/21   [name], MD  ?vitamin B-12 (CYANOCOBALAMIN) 1000 MCG tablet Take 1,000 mcg by mouth daily.    [provider]  ?rivaroxaban (XARELTO) 20 MG TABS tablet Take 1 tablet (20 mg total) by mouth daily with supper. 06/26/20 10/19/20  [name], MD  ? ? ?Family History  ?  ?Family History  ?Problem Relation Age of Onset  ? Alzheimer's disease Mother   ? Alzheimer's disease Father   ? ?She indicated that her mother is deceased. She indicated that her father is deceased. She indicated that five of her seven sisters are alive. She indicated that all of her three brothers are alive. She indicated that all of her three daughters are alive. She indicated that all of her three sons are alive. ? ? ?Social History  ?  ?Social History  ? ?Socioeconomic History  ? Marital  status: Married  ?  Spouse name: Not on file  ? Number of children: Not on file  ? Years of education: Not on file  ? Highest education level: Not on file  ?Occupational History  ? Not on file  ?Tobacco Use  ? Smoking status: Never  ? Smokeless tobacco: Never  ?Vaping Use  ? Vaping Use: Never used  ?Substance and Sexual Activity  ? Alcohol use: Never  ? Drug use: Never  ? Sexual activity: Not on file  ?Other Topics Concern  ? Not on file  ?Social History Narrative  ? Per [place] new patient packet abstracted 03/28/2019  ?   ? Diet: Vegetarian   ?   ? Caffeine: None  ?   ? Married, if yes what year: Yes, 1959  ?   ? Do you live in a house, apartment, assisted living, condo, trailer, ect: House, 2 stories, 5 persons  ?   ? Pets: Dog  ?   ? Highest level of education: 6th grade   ?   ? Current/Past profession: N/A  ?   ? Exercise:No  ?   ?   ? Living Will: No  ? DNR: No  ? POA/HPOA: No  ?   ? Functional Status:  ? Do you have difficulty bathing or dressing yourself? No  ? Do you have difficulty preparing food or eating? Yes  ? Do you have difficulty managing your medications? Yes  ? Do you have difficulty managing  your finances? Yes  ? Do you have difficulty affording your medications? Nes  ? ?Social Determinants of Health  ? ?Financial Resource Strain: Low Risk   ? Difficulty of Paying Living Expenses: Not hard at all  ?F

## 2022-02-24 ENCOUNTER — Other Ambulatory Visit: Payer: Medicare HMO | Admitting: Hospice

## 2022-02-24 DIAGNOSIS — F0390 Unspecified dementia without behavioral disturbance: Secondary | ICD-10-CM | POA: Diagnosis not present

## 2022-02-24 DIAGNOSIS — R634 Abnormal weight loss: Secondary | ICD-10-CM | POA: Diagnosis not present

## 2022-02-24 DIAGNOSIS — Z515 Encounter for palliative care: Secondary | ICD-10-CM

## 2022-02-24 DIAGNOSIS — M545 Low back pain, unspecified: Secondary | ICD-10-CM

## 2022-02-24 DIAGNOSIS — C3480 Malignant neoplasm of overlapping sites of unspecified bronchus and lung: Secondary | ICD-10-CM | POA: Diagnosis not present

## 2022-02-24 NOTE — Progress Notes (Signed)
? ? ?Manufacturing engineer ?Community Palliative Care Consult Note ?Telephone: [phone]  ?Fax: [phone] ? ?PATIENT NAME: Valerie [name] ?DOB: 1941/07/08 ?MRN: [id number] ? ?PRIMARY CARE PROVIDER:   [name], NP ?Valerie Perna, NP ?[address] ?[place],  [place] [address] ? ?REFERRING PROVIDER: [name], NP ?Valerie Perna, NP ?[address] ?[place],  [place] [address] ? ?RESPONSIBLE PARTY:  [facility] [phone] ?Valerie [name]: [phone] ?        ?[name] Information   ?  Name Relation Home Work Mobile  ?  Valerie [name] Daughter     [phone]  ?  Valerie [name] Daughter     [phone]  ?   ?  ? TELEHEALTH VISIT STATEMENT ?Due to the COVID-19 crisis, this visit was done via telemedicine from my office and it was initiated and consent by this patient and or family. Video-audio (telehealth) [name] was unable to be done due to technical barriers from the patient?s side. ?I connected with patient OR PROXY by a telephone  and verified that I am speaking with the correct person. I discussed the limitations of evaluation and management by telemedicine. The patient expressed understanding and agreed to proceed. Valerie [name] and Valerie [name] are with patient during visit.  ? ?Visit is to build trust and highlight Palliative Medicine as specialized medical care for people living with serious illness, aimed at facilitating better quality of life through symptoms relief, assisting with advance care planning and complex medical decision making.  This is a follow up visit. ?  ?RECOMMENDATIONS/PLAN:  ? Advance Care Planning/Code Status: Patient is a DO NOT RESUSCITATE. ?  ?Goals of Care:Goals of care include to maximize quality of life and symptom management. Patient is not interested in any treatment for her lung cancer. MOST selections include no feeding tube, IV fluids/antibiotics as indicated, limited additional intervention. ?Patient is interested in hospice service in  the future. She will like end of life care in her home.  ?  ?Visit consisted of counseling and education dealing with the complex and emotionally intense issues of symptom management and palliative care in the setting of serious and potentially life-threatening illness. Valerie [name] faith help patient and  family to cope.  Palliative care team will continue to support patient, patient's family, and medical team. ?  ?3. Symptom management/Plan:  ?Lung CA: s/p radiation. No chemo/radiation. Continue ongoing supportive care. Patient uses oxygen supplementation prn at 3L/Min Dementia: worsening memory loss/confusion in line with Dementia disease trajectory.  FAST 6a from FAST 5  last visit.  FLACC 0. ?Weight loss: likely related to Lung CA. Height/Weight: 5 feet 4 inches/127Ibs  in April 2023 142 Nov 2022. Ensure BID. Continue to offer enough time and assistance during meals; offer 4-6 small meals. Encourage fruits and vegetable in smoothie forms. ?Low back pain: Stable.  Managed with Tylenol with Codeine. Continue use of heating pad and activity as tolerated.  Balance of rest and performance activity.  ?Palliative will continue to monitor for symptom management/decline and make recommendations as needed. Return 2 months or prn. Encouraged to call provider sooner with any concerns.  ? CHIEF COMPLAINT: Palliative follow up visit ? ?HISTORY OF PRESENT ILLNESS: Follow-up visit for Valerie [name],  a 81 y.o. female with multiple medical problems including lung cancer for which patient refuses further treatment. Patient continues on weight loss, sarcopenia, despite good appetite.  History of Afib,  COVID-19 viral infection, Aortic insufficiency/stenosis- followed by Cardiologist. Patient with history of Alzheimer's Dementia, dizziness,  constipation, Afib.   History obtained from review of EMR, discussion with primary team, family and/or patient. Records reviewed and summarized above. All 10 point systems reviewed and are negative  except as documented in history of present illness above ? ?Review and summarization of Epic records shows history from other than patient.  ? ?Palliative Care was asked to follow this patient o help address complex decision making in the context of advance care planning and goals of care clarification.  ? ? ?PERTINENT MEDICATIONS:  ?Outpatient Encounter Medications as of 02/24/2022  ?Medication Sig  ? acetaminophen-codeine (TYLENOL #3) 300-30 MG tablet Take 1 tablet by mouth every 6 (six) hours as needed for moderate pain.  ? Albuterol Sulfate (PROAIR RESPICLICK) 433 (90 Base) MCG/ACT AEPB Inhale 1 puff into the lungs daily as needed (For shortness of breath).  ? apixaban (ELIQUIS) 5 MG TABS tablet Take 1 tablet (5 mg total) by mouth 2 (two) times daily.  ? atorvastatin (LIPITOR) 20 MG tablet Take 1 tablet (20 mg total) by mouth daily.  ? Cholecalciferol 50 MCG (2000 UT) TABS Take 2,000 Units by mouth daily at 8 pm.   ? furosemide (LASIX) 20 MG tablet Take 1 tablet (20 mg total) by mouth daily. Keep the appointment for future refills.  ? OXYGEN Inhale 3 L into the lungs at bedtime.   ? predniSONE (STERAPRED UNI-PAK 21 TAB) 10 MG (21) TBPK tablet Take by mouth daily. Take 6 tabs by mouth daily  for 2 days, then 5 tabs for 2 days, then 4 tabs for 2 days, then 3 tabs for 2 days, 2 tabs for 2 days, then 1 tab by mouth daily for 2 days  ? spironolactone (ALDACTONE) 25 MG tablet Take 1 tablet (25 mg total) by mouth daily.  ? vitamin B-12 (CYANOCOBALAMIN) 1000 MCG tablet Take 1,000 mcg by mouth daily.  ? [DISCONTINUED] rivaroxaban (XARELTO) 20 MG TABS tablet Take 1 tablet (20 mg total) by mouth daily with supper.  ? ?No facility-administered encounter medications on file as of 02/24/2022.  ? ? ?HOSPICE ELIGIBILITY/DIAGNOSIS: TBD ? ?PAST MEDICAL HISTORY:  ?Past Medical History:  ?Diagnosis Date  ? Alzheimer disease ([place])   ? Per new patient packet- [facility]   ? Atrial fibrillation ([place]) 2006  ? Per Records received from [facility]   ? Dementia ([place])   ? Per new patient packet- [facility]   ? Low oxygen saturation   ? Per new patient packet- [facility]   ? Lung cancer ([place])   ? Per new patient packet- [facility]   ? On home oxygen therapy   ? Per [facility] received from [facility]  ? Pneumonia   ? Per new patient packet- [facility]   ? ? ?ALLERGIES: No Known Allergies   ? ?I spent 30 minutes providing this consultation; this includes time spent with patient/family, chart review and documentation. More than 50% of the time in this consultation was spent on counseling and coordinating communication  ? ?Thank you for the opportunity to participate in the care of Shatasia Cutshaw Please call our office at [phone] if we can be of additional assistance. ? ?Note: Portions of this note were generated with Lobbyist. Dictation errors may occur despite best attempts at proofreading. ? ?Teodoro Spray, NP ? ?  ?

## 2022-02-25 ENCOUNTER — Ambulatory Visit: Payer: Medicare HMO | Admitting: General Practice

## 2022-02-25 ENCOUNTER — Encounter: Payer: Self-pay | Admitting: General Practice

## 2022-02-25 VITALS — BP 142/84 | Resp 14 | Ht 62.0 in | Wt 127.2 lb

## 2022-02-25 DIAGNOSIS — E782 Mixed hyperlipidemia: Secondary | ICD-10-CM | POA: Diagnosis not present

## 2022-02-25 DIAGNOSIS — I5032 Chronic diastolic (congestive) heart failure: Secondary | ICD-10-CM | POA: Diagnosis not present

## 2022-02-25 DIAGNOSIS — I25119 Atherosclerotic heart disease of native coronary artery with unspecified angina pectoris: Secondary | ICD-10-CM | POA: Diagnosis not present

## 2022-02-25 DIAGNOSIS — I4891 Unspecified atrial fibrillation: Secondary | ICD-10-CM | POA: Diagnosis not present

## 2022-02-25 DIAGNOSIS — G4733 Obstructive sleep apnea (adult) (pediatric): Secondary | ICD-10-CM | POA: Diagnosis not present

## 2022-02-25 DIAGNOSIS — I351 Nonrheumatic aortic (valve) insufficiency: Secondary | ICD-10-CM | POA: Diagnosis not present

## 2022-02-25 NOTE — Patient Instructions (Signed)
Medication Instructions:  ?Your Physician recommend you continue on your current medication as directed.   ? ?*If you need a refill on your cardiac medications before your next appointment, please call your pharmacy* ? ? ?Follow-Up: ?At [facility], you and your health needs are our priority.  As part of our continuing mission to provide you with exceptional heart care, we have created designated Provider Care Teams.  These Care Teams include your primary Cardiologist (physician) and Advanced Practice Providers (APPs -  Physician Assistants and Nurse Practitioners) who all work together to provide you with the care you need, when you need it. ? ?We recommend signing up for the patient portal called "MyChart".  Sign up information is provided on this After Visit Summary.  MyChart is used to connect with patients for Virtual Visits (Telemedicine).  Patients are able to view lab/test results, encounter notes, upcoming appointments, etc.  Non-urgent messages can be sent to your provider as well.   ?To learn more about what you can do with MyChart, go to [email or web address].   ? ?Your next appointment:  ?Wednesday September 07, 2022 at 1:20 pm ? ?The format for your next appointment:   ?In Person ? ?Provider:   ?[name], MD  ? ? ? ?

## 2022-03-24 DIAGNOSIS — C3490 Malignant neoplasm of unspecified part of unspecified bronchus or lung: Secondary | ICD-10-CM | POA: Diagnosis not present

## 2022-03-24 DIAGNOSIS — J9611 Chronic respiratory failure with hypoxia: Secondary | ICD-10-CM | POA: Diagnosis not present

## 2022-04-05 ENCOUNTER — Telehealth: Payer: Self-pay | Admitting: General Practice

## 2022-04-05 ENCOUNTER — Other Ambulatory Visit: Payer: Self-pay

## 2022-04-05 DIAGNOSIS — I110 Hypertensive heart disease with heart failure: Secondary | ICD-10-CM

## 2022-04-05 MED ORDER — FUROSEMIDE 20 MG PO TABS: 20.0000 mg | ORAL_TABLET | Freq: Every day | ORAL | 3 refills | Status: DC

## 2022-04-05 NOTE — Telephone Encounter (Signed)
Medication refilled and sent to desired pharmacy

## 2022-04-05 NOTE — Telephone Encounter (Signed)
 *  STAT* If patient is at the pharmacy, call can be transferred to refill team.   1. Which medications need to be refilled? (please list name of each medication and dose if known) furosemide (LASIX) 20 MG tablet  2. Which pharmacy/location (including street and city if local pharmacy) is medication to be sent to? [place], [place] Fax# [phone]  3. Do they need a 30 day or 90 day supply? 90 days

## 2022-05-06 ENCOUNTER — Telehealth (INDEPENDENT_AMBULATORY_CARE_PROVIDER_SITE_OTHER): Payer: Self-pay | Admitting: Primary Care

## 2022-05-06 NOTE — Telephone Encounter (Signed)
Copied from [place] [id number]. Topic: General - Other >> May 06, 2022  4:26 PM Everette C wrote: Reason for CRM: Daria with [facility] has called to request a prescription for 5 Liter Concentrator and portable concentrator (POC) due to insurance coordination   Please include liter flow in the instructions for the prescription   Please fax Rx to [phone] Attention Daria in [place]   A prescription is required to be on file for the patient   Please contact further if needed

## 2022-05-09 ENCOUNTER — Other Ambulatory Visit (INDEPENDENT_AMBULATORY_CARE_PROVIDER_SITE_OTHER): Payer: Self-pay | Admitting: Primary Care

## 2022-05-09 DIAGNOSIS — C3490 Malignant neoplasm of unspecified part of unspecified bronchus or lung: Secondary | ICD-10-CM

## 2022-05-09 DIAGNOSIS — Z515 Encounter for palliative care: Secondary | ICD-10-CM

## 2022-05-09 NOTE — Telephone Encounter (Signed)
Done and nurse will fax order

## 2022-05-09 NOTE — Telephone Encounter (Signed)
Routed to PCP 

## 2022-05-22 ENCOUNTER — Other Ambulatory Visit: Payer: Self-pay | Admitting: Internal Medicine

## 2022-05-25 ENCOUNTER — Telehealth (INDEPENDENT_AMBULATORY_CARE_PROVIDER_SITE_OTHER): Payer: Self-pay

## 2022-05-25 NOTE — Telephone Encounter (Signed)
Order faxed. Nat Christen, CMA   Copied from [place] [id number]. Topic: General - Other >> May 25, 2022 11:21 AM Sabas Sous wrote: Fax: [phone]  We need oxygen order with current liter flow  Gerald Stabs from [place] contact: [phone]

## 2022-05-26 ENCOUNTER — Other Ambulatory Visit: Payer: Self-pay | Admitting: *Deleted

## 2022-05-26 ENCOUNTER — Telehealth: Payer: Medicare HMO | Admitting: Hospice

## 2022-05-26 MED ORDER — SPIRONOLACTONE 25 MG PO TABS: 25.0000 mg | ORAL_TABLET | Freq: Every day | ORAL | 2 refills | Status: DC

## 2022-06-02 ENCOUNTER — Telehealth: Payer: Medicare HMO | Admitting: Hospice

## 2022-06-02 DIAGNOSIS — M545 Low back pain, unspecified: Secondary | ICD-10-CM

## 2022-06-02 DIAGNOSIS — R634 Abnormal weight loss: Secondary | ICD-10-CM

## 2022-06-02 DIAGNOSIS — Z515 Encounter for palliative care: Secondary | ICD-10-CM

## 2022-06-02 DIAGNOSIS — C3480 Malignant neoplasm of overlapping sites of unspecified bronchus and lung: Secondary | ICD-10-CM

## 2022-06-02 NOTE — Progress Notes (Signed)
[facility] Consult Note Telephone: [phone]  Fax: [phone]  PATIENT NAME: Valerie [name] DOB: 08/21/1941 MRN: [id number]  PRIMARY CARE PROVIDER:   [name], NP [name], NP [address] [place],  [place] [address]  REFERRING PROVIDER: [name], NP [name], NP [address],  [place] [address]  RESPONSIBLE PARTY:  Self/Valerie [name] is second Emergency contact: [phone]         [id number]     Name Relation Home Work [name], Valerie Daughter     [phone]    [facility] Daughter     [phone]        TELEHEALTH VISIT STATEMENT Due to the COVID-19 crisis, this visit was done via telemedicine from my office and it was initiated and consent by this patient and or family. Video-audio (telehealth) contact was unable to be done due to technical barriers from the patient's side. I connected with patient OR PROXY by a telephone  and verified that I am speaking with the correct person. I discussed the limitations of evaluation and management by telemedicine. The patient expressed understanding and agreed to proceed. [name] is with patient during visit.   Visit is to build trust and highlight Palliative Medicine as specialized medical care for people living with serious illness, aimed at facilitating better quality of life through symptoms relief, assisting with advance care planning and complex medical decision making.  This is a follow up visit.   RECOMMENDATIONS/PLAN:   Advance Care Planning/Code Status: Patient is a DO NOT RESUSCITATE.   Goals of Care:Goals of care include to maximize quality of life and symptom management. Patient is not interested in any treatment for her lung cancer. MOST selections include no feeding tube, IV fluids/antibiotics as indicated, limited additional intervention. Patient is interested in hospice service in the future.  She will like end of life care in her home.    Visit consisted of counseling and education dealing with the complex and emotionally intense issues of symptom management and palliative care in the setting of serious and potentially life-threatening illness. Valerie [name] faith help patient and  family to cope.  Palliative care team will continue to support patient, patient's family, and medical team.   Symptom management/Plan:  Lung CA: s/p radiation. No chemo/radiation. Continue ongoing supportive care. Patient uses oxygen supplementation prn at 3L/Min  Dementia: worsening memory loss/confusion in line with Dementia disease trajectory.  Independent in ADLs. FAST 6a from FAST 5  last visit.  Provide cues as needed; encourage socialization, word puzzles/search Weight loss: likely related to Lung CA. Height/Weight: 5 feet 4 inches/125Ibs in April 2023 142 Nov 2022. Appetite is good. Ensure BID. Continue to offer enough time and assistance during meals; offer 4-6 small meals. Encourage fruits and vegetable in smoothie forms. Low back pain: Stable.  Managed with Tylenol with Codeine. Continue use of heating pad and activity as tolerated.  Balance of rest and performance activity.  Palliative will continue to monitor for symptom management/decline and make recommendations as needed. Return 2 months or prn. Encouraged to call provider sooner with any concerns.   CHIEF COMPLAINT: Palliative follow up visit  HISTORY OF PRESENT ILLNESS: Follow-up visit for [facility],  a 81 y.o. female with multiple medical problems including lung cancer for which patient refuses further treatment. Patient continues on weight loss, sarcopenia, despite good appetite.  History of Afib,  COVID-19 viral infection, Aortic insufficiency/stenosis- followed  by Cardiologist. Patient with history of Alzheimer's Dementia, dizziness, constipation, Afib.   History obtained from review of EMR, discussion with primary team, family and/or patient.  Records reviewed and summarized above. All 10 point systems reviewed and are negative except as documented in history of present illness above  Review and summarization of Epic records shows history from other than patient.   Palliative Care was asked to follow this patient and help address complex decision making in the context of advance care planning and goals of care clarification.    PERTINENT MEDICATIONS:  Outpatient Encounter Medications as of 06/02/2022  Medication Sig   acetaminophen-codeine (TYLENOL #3) 300-30 MG tablet Take 1 tablet by mouth every 6 (six) hours as needed for moderate pain.   Albuterol Sulfate (PROAIR RESPICLICK) 389 (90 Base) MCG/ACT AEPB Inhale 1 puff into the lungs daily as needed (For shortness of breath).   apixaban (ELIQUIS) 5 MG TABS tablet Take 1 tablet (5 mg total) by mouth 2 (two) times daily.   atorvastatin (LIPITOR) 20 MG tablet Take 1 tablet (20 mg total) by mouth daily.   Cholecalciferol 50 MCG (2000 UT) TABS Take 2,000 Units by mouth daily at 8 pm.    furosemide (LASIX) 20 MG tablet Take 1 tablet (20 mg total) by mouth daily. Keep the appointment for future refills.   OXYGEN Inhale 3 L into the lungs at bedtime.    predniSONE (STERAPRED UNI-PAK 21 TAB) 10 MG (21) TBPK tablet Take by mouth daily. Take 6 tabs by mouth daily  for 2 days, then 5 tabs for 2 days, then 4 tabs for 2 days, then 3 tabs for 2 days, 2 tabs for 2 days, then 1 tab by mouth daily for 2 days   spironolactone (ALDACTONE) 25 MG tablet Take 1 tablet (25 mg total) by mouth daily.   vitamin B-12 (CYANOCOBALAMIN) 1000 MCG tablet Take 1,000 mcg by mouth daily.   [DISCONTINUED] rivaroxaban (XARELTO) 20 MG TABS tablet Take 1 tablet (20 mg total) by mouth daily with supper.   No facility-administered encounter medications on file as of 06/02/2022.    HOSPICE ELIGIBILITY/DIAGNOSIS: TBD  PAST MEDICAL HISTORY:  Past Medical History:  Diagnosis Date   Alzheimer disease [facility])    Per new  patient packet- [place]    Atrial fibrillation (Valerie [name]) 2006   Per Records received from [facility])    Per new patient packet- [facility]    Low oxygen saturation    Per new patient packet- [place]    Lung cancer ([place])    Per new patient packet- [place]    On home oxygen therapy    Per Records received from [facility]   Pneumonia    Per new patient packet- [place]: No Known Allergies    I spent 30 minutes providing this consultation; this includes time spent with patient/family, chart review and documentation. More than 50% of the time in this consultation was spent on counseling and coordinating communication   Thank you for the opportunity to participate in the care of Valerie [name] Please call our office at [phone] if we can be of additional assistance.  Note: Portions of this note were generated with [employer]. Dictation errors may occur despite best attempts at proofreading.  Teodoro Spray, NP

## 2022-07-11 ENCOUNTER — Other Ambulatory Visit: Payer: Self-pay

## 2022-07-11 ENCOUNTER — Inpatient Hospital Stay (HOSPITAL_COMMUNITY): Payer: Medicare HMO

## 2022-07-11 ENCOUNTER — Inpatient Hospital Stay (HOSPITAL_COMMUNITY)
Admission: EM | Admit: 2022-07-11 | Discharge: 2022-07-13 | DRG: 291 | Disposition: A | Discharge status: Hospice | Payer: Medicare HMO | Attending: Internal Medicine | Admitting: Internal Medicine

## 2022-07-11 ENCOUNTER — Emergency Department (HOSPITAL_COMMUNITY): Payer: Medicare HMO

## 2022-07-11 ENCOUNTER — Encounter (HOSPITAL_COMMUNITY): Payer: Self-pay

## 2022-07-11 ENCOUNTER — Telehealth: Payer: Self-pay | Admitting: *Deleted

## 2022-07-11 ENCOUNTER — Telehealth (INDEPENDENT_AMBULATORY_CARE_PROVIDER_SITE_OTHER): Payer: Self-pay | Admitting: Primary Care

## 2022-07-11 DIAGNOSIS — Z79899 Other long term (current) drug therapy: Secondary | ICD-10-CM | POA: Diagnosis not present

## 2022-07-11 DIAGNOSIS — E785 Hyperlipidemia, unspecified: Secondary | ICD-10-CM | POA: Diagnosis present

## 2022-07-11 DIAGNOSIS — C3491 Malignant neoplasm of unspecified part of right bronchus or lung: Secondary | ICD-10-CM | POA: Diagnosis present

## 2022-07-11 DIAGNOSIS — Z66 Do not resuscitate: Secondary | ICD-10-CM | POA: Diagnosis not present

## 2022-07-11 DIAGNOSIS — C3492 Malignant neoplasm of unspecified part of left bronchus or lung: Secondary | ICD-10-CM | POA: Diagnosis not present

## 2022-07-11 DIAGNOSIS — I2583 Coronary atherosclerosis due to lipid rich plaque: Secondary | ICD-10-CM | POA: Diagnosis not present

## 2022-07-11 DIAGNOSIS — I4821 Permanent atrial fibrillation: Secondary | ICD-10-CM | POA: Diagnosis not present

## 2022-07-11 DIAGNOSIS — Z9981 Dependence on supplemental oxygen: Secondary | ICD-10-CM | POA: Diagnosis not present

## 2022-07-11 DIAGNOSIS — J9611 Chronic respiratory failure with hypoxia: Secondary | ICD-10-CM

## 2022-07-11 DIAGNOSIS — I4891 Unspecified atrial fibrillation: Secondary | ICD-10-CM | POA: Diagnosis not present

## 2022-07-11 DIAGNOSIS — I50813 Acute on chronic right heart failure: Secondary | ICD-10-CM

## 2022-07-11 DIAGNOSIS — C3480 Malignant neoplasm of overlapping sites of unspecified bronchus and lung: Secondary | ICD-10-CM | POA: Diagnosis not present

## 2022-07-11 DIAGNOSIS — R0602 Shortness of breath: Secondary | ICD-10-CM | POA: Diagnosis not present

## 2022-07-11 DIAGNOSIS — J90 Pleural effusion, not elsewhere classified: Secondary | ICD-10-CM | POA: Diagnosis not present

## 2022-07-11 DIAGNOSIS — R7989 Other specified abnormal findings of blood chemistry: Secondary | ICD-10-CM

## 2022-07-11 DIAGNOSIS — R001 Bradycardia, unspecified: Secondary | ICD-10-CM

## 2022-07-11 DIAGNOSIS — I50811 Acute right heart failure: Secondary | ICD-10-CM | POA: Diagnosis not present

## 2022-07-11 DIAGNOSIS — R0600 Dyspnea, unspecified: Principal | ICD-10-CM

## 2022-07-11 DIAGNOSIS — Z20822 Contact with and (suspected) exposure to covid-19: Secondary | ICD-10-CM | POA: Diagnosis not present

## 2022-07-11 DIAGNOSIS — I351 Nonrheumatic aortic (valve) insufficiency: Secondary | ICD-10-CM

## 2022-07-11 DIAGNOSIS — I5082 Biventricular heart failure: Secondary | ICD-10-CM | POA: Diagnosis present

## 2022-07-11 DIAGNOSIS — I272 Pulmonary hypertension, unspecified: Secondary | ICD-10-CM | POA: Diagnosis present

## 2022-07-11 DIAGNOSIS — I2510 Atherosclerotic heart disease of native coronary artery without angina pectoris: Secondary | ICD-10-CM | POA: Diagnosis not present

## 2022-07-11 DIAGNOSIS — Z90711 Acquired absence of uterus with remaining cervical stump: Secondary | ICD-10-CM

## 2022-07-11 DIAGNOSIS — I5033 Acute on chronic diastolic (congestive) heart failure: Secondary | ICD-10-CM | POA: Diagnosis present

## 2022-07-11 DIAGNOSIS — E782 Mixed hyperlipidemia: Secondary | ICD-10-CM

## 2022-07-11 DIAGNOSIS — Z515 Encounter for palliative care: Secondary | ICD-10-CM

## 2022-07-11 DIAGNOSIS — Z820 Family history of epilepsy and other diseases of the nervous system: Secondary | ICD-10-CM | POA: Diagnosis not present

## 2022-07-11 DIAGNOSIS — R778 Other specified abnormalities of plasma proteins: Secondary | ICD-10-CM | POA: Diagnosis not present

## 2022-07-11 DIAGNOSIS — I447 Left bundle-branch block, unspecified: Secondary | ICD-10-CM | POA: Diagnosis present

## 2022-07-11 DIAGNOSIS — Z7189 Other specified counseling: Secondary | ICD-10-CM | POA: Diagnosis not present

## 2022-07-11 DIAGNOSIS — G309 Alzheimer's disease, unspecified: Secondary | ICD-10-CM | POA: Diagnosis present

## 2022-07-11 DIAGNOSIS — Z91199 Patient's noncompliance with other medical treatment and regimen due to unspecified reason: Secondary | ICD-10-CM | POA: Diagnosis not present

## 2022-07-11 DIAGNOSIS — J9621 Acute and chronic respiratory failure with hypoxia: Secondary | ICD-10-CM | POA: Diagnosis present

## 2022-07-11 DIAGNOSIS — I110 Hypertensive heart disease with heart failure: Secondary | ICD-10-CM | POA: Diagnosis not present

## 2022-07-11 DIAGNOSIS — I509 Heart failure, unspecified: Secondary | ICD-10-CM | POA: Diagnosis not present

## 2022-07-11 DIAGNOSIS — R9431 Abnormal electrocardiogram [ECG] [EKG]: Secondary | ICD-10-CM

## 2022-07-11 DIAGNOSIS — G935 Compression of brain: Secondary | ICD-10-CM | POA: Diagnosis not present

## 2022-07-11 DIAGNOSIS — D509 Iron deficiency anemia, unspecified: Secondary | ICD-10-CM | POA: Diagnosis present

## 2022-07-11 DIAGNOSIS — F02818 Dementia in other diseases classified elsewhere, unspecified severity, with other behavioral disturbance: Secondary | ICD-10-CM | POA: Diagnosis present

## 2022-07-11 DIAGNOSIS — R42 Dizziness and giddiness: Secondary | ICD-10-CM | POA: Diagnosis not present

## 2022-07-11 DIAGNOSIS — Z85118 Personal history of other malignant neoplasm of bronchus and lung: Secondary | ICD-10-CM | POA: Diagnosis not present

## 2022-07-11 DIAGNOSIS — F0390 Unspecified dementia without behavioral disturbance: Secondary | ICD-10-CM

## 2022-07-11 DIAGNOSIS — J9622 Acute and chronic respiratory failure with hypercapnia: Secondary | ICD-10-CM | POA: Diagnosis not present

## 2022-07-11 DIAGNOSIS — I50810 Right heart failure, unspecified: Secondary | ICD-10-CM | POA: Diagnosis present

## 2022-07-11 DIAGNOSIS — C3490 Malignant neoplasm of unspecified part of unspecified bronchus or lung: Secondary | ICD-10-CM

## 2022-07-11 DIAGNOSIS — Z7901 Long term (current) use of anticoagulants: Secondary | ICD-10-CM | POA: Diagnosis not present

## 2022-07-11 LAB — I-STAT VENOUS BLOOD GAS, ED
Acid-Base Excess: 11 mmol/L — ABNORMAL HIGH (ref 0.0–2.0)
Bicarbonate: 38.4 mmol/L — ABNORMAL HIGH (ref 20.0–28.0)
Calcium, Ion: 1.21 mmol/L (ref 1.15–1.40)
HCT: 33 % — ABNORMAL LOW (ref 36.0–46.0)
Hemoglobin: 11.2 g/dL — ABNORMAL LOW (ref 12.0–15.0)
O2 Saturation: 42 %
Potassium: 4.7 mmol/L (ref 3.5–5.1)
Sodium: 139 mmol/L (ref 135–145)
TCO2: 40 mmol/L — ABNORMAL HIGH (ref 22–32)
pCO2, Ven: 68.8 mmHg — ABNORMAL HIGH (ref 44–60)
pH, Ven: 7.355 (ref 7.25–7.43)
pO2, Ven: 26 mmHg — CL (ref 32–45)

## 2022-07-11 LAB — LIPASE, BLOOD: Lipase: 38 U/L (ref 11–51)

## 2022-07-11 LAB — COMPREHENSIVE METABOLIC PANEL
ALT: 18 U/L (ref 0–44)
AST: 22 U/L (ref 15–41)
Albumin: 3.1 g/dL — ABNORMAL LOW (ref 3.5–5.0)
Alkaline Phosphatase: 56 U/L (ref 38–126)
Anion gap: 8 (ref 5–15)
BUN: 21 mg/dL (ref 8–23)
CO2: 35 mmol/L — ABNORMAL HIGH (ref 22–32)
Calcium: 9.2 mg/dL (ref 8.9–10.3)
Chloride: 97 mmol/L — ABNORMAL LOW (ref 98–111)
Creatinine, Ser: 0.66 mg/dL (ref 0.44–1.00)
GFR, Estimated: >60 mL/min (ref >60)
Glucose, Bld: 110 mg/dL — ABNORMAL HIGH (ref 70–99)
Potassium: 4.5 mmol/L (ref 3.5–5.1)
Sodium: 140 mmol/L (ref 135–145)
Total Bilirubin: 0.9 mg/dL (ref 0.3–1.2)
Total Protein: 6.9 g/dL (ref 6.5–8.1)

## 2022-07-11 LAB — TROPONIN I (HIGH SENSITIVITY)
Troponin I (High Sensitivity): 29 ng/L — ABNORMAL HIGH (ref <18)
Troponin I (High Sensitivity): 26 ng/L — ABNORMAL HIGH (ref <18)
Troponin I (High Sensitivity): 29 ng/L — ABNORMAL HIGH (ref <18)

## 2022-07-11 LAB — RETICULOCYTES
Immature Retic Fract: 9.8 % (ref 2.3–15.9)
RBC.: 3.47 MIL/uL — ABNORMAL LOW (ref 3.87–5.11)
Retic Count, Absolute: 44.1 K/uL (ref 19.0–186.0)
Retic Ct Pct: 1.3 % (ref 0.4–3.1)

## 2022-07-11 LAB — CBC WITH DIFFERENTIAL/PLATELET
Abs Immature Granulocytes: 0.01 10*3/uL (ref 0.00–0.07)
Basophils Absolute: 0 10*3/uL (ref 0.0–0.1)
Basophils Relative: 1 %
Eosinophils Absolute: 0.1 10*3/uL (ref 0.0–0.5)
Eosinophils Relative: 1 %
HCT: 32.2 % — ABNORMAL LOW (ref 36.0–46.0)
Hemoglobin: 9.6 g/dL — ABNORMAL LOW (ref 12.0–15.0)
Immature Granulocytes: 0 %
Lymphocytes Relative: 15 %
Lymphs Abs: 0.7 10*3/uL (ref 0.7–4.0)
MCH: 28.5 pg (ref 26.0–34.0)
MCHC: 29.8 g/dL — ABNORMAL LOW (ref 30.0–36.0)
MCV: 95.5 fL (ref 80.0–100.0)
Monocytes Absolute: 0.6 10*3/uL (ref 0.1–1.0)
Monocytes Relative: 13 %
Neutro Abs: 3.2 10*3/uL (ref 1.7–7.7)
Neutrophils Relative %: 70 %
Platelets: 155 10*3/uL (ref 150–400)
RBC: 3.37 MIL/uL — ABNORMAL LOW (ref 3.87–5.11)
RDW: 13.5 % (ref 11.5–15.5)
WBC: 4.6 10*3/uL (ref 4.0–10.5)
nRBC: 0 % (ref 0.0–0.2)

## 2022-07-11 LAB — SARS CORONAVIRUS 2 BY RT PCR: SARS Coronavirus 2 by RT PCR: NEGATIVE

## 2022-07-11 LAB — HEMOGLOBIN A1C
Hgb A1c MFr Bld: 5.3 % (ref 4.8–5.6)
Mean Plasma Glucose: 105.41 mg/dL

## 2022-07-11 LAB — BRAIN NATRIURETIC PEPTIDE: B Natriuretic Peptide: 656.1 pg/mL — ABNORMAL HIGH (ref 0.0–100.0)

## 2022-07-11 MED ORDER — ASPIRIN 81 MG PO CHEW
324.0000 mg | CHEWABLE_TABLET | Freq: Once | ORAL | Status: AC
  Administered 2022-07-11: 324 mg via ORAL
  Filled 2022-07-11: qty 4

## 2022-07-11 MED ORDER — FUROSEMIDE 10 MG/ML IJ SOLN
40.0000 mg | Freq: Once | INTRAMUSCULAR | Status: AC
  Administered 2022-07-11: 40 mg via INTRAVENOUS
  Filled 2022-07-11: qty 4

## 2022-07-11 MED ORDER — IOHEXOL 350 MG/ML SOLN
80.0000 mL | Freq: Once | INTRAVENOUS | Status: AC | PRN
  Administered 2022-07-11: 80 mL via INTRAVENOUS

## 2022-07-11 MED ORDER — INSULIN ASPART 100 UNIT/ML IJ SOLN: 0.0000 [IU] | INTRAMUSCULAR | Status: DC

## 2022-07-11 NOTE — ED Notes (Signed)
Pt came in for dizziness, SOB, stomach pain. SOB and dizziness started on Saturday and the stomach pain started within the last 24 hours. Abdominal pain in the two upper quadrants. Dizziness pt and family states seems as if the room is spinning. SOB was constant when sitting still and worse when ambulatory. Pt has lower swelling in the feet which is normal for her.

## 2022-07-11 NOTE — ED Triage Notes (Addendum)
Daughter reports patient is here for SOB, CP, dizzniess and abd pain. All has been going on for 4 days.  Patient is suppose to wear 3L [place] at all times but patient refuses and only wears it at night. Bilateral leg swelling. Hx of lung cancer.

## 2022-07-11 NOTE — Assessment & Plan Note (Signed)
Will need cardiology consult Echo in AM Troponin stable

## 2022-07-11 NOTE — Telephone Encounter (Signed)
Caller is requesting a new rx for a concentrator home and portable oxygen tank w/ supplies sent to [facility] for patient   Fax # [phone]   Please assist further

## 2022-07-11 NOTE — H&P (Signed)
Valerie [name] ZOX:[id number] DOB: Feb 22, 1941 DOA: 07/11/2022     PCP: [name], NP   Outpatient Specialists:   CARDS:   Dr. [name] Edwards, [place] Cage, NP    Patient arrived to ER on 07/11/22 at 1446 Referred by Attending Elnora Morrison, MD   Patient coming from:    home Lives With family    Chief Complaint:   Chief Complaint  Patient presents with   Dizziness   Shortness of Breath   Chest Pain   Abdominal Pain    HPI: Valerie [name] is a 81 y.o. female with medical history significant of A-fib on Eliquis, lung cancer, she will be aortic regurgitation, chronic diastolic CHF, HLD, OSA, CAD, dementia    Presented with  shortness of breath progressive Patient presents with shortness of breath chest pain and dizziness as well as abdominal pain for the past 4 days she is supposed to be on 3 L of oxygen but has not been compliant.  Has been having bilateral leg edema patient with known history of lung cancer palliative care Has been describing vertigo.  Shortness of breath with exertion as well. Raquel Sarna states that she has persistent lower extremity swelling Known history of atrial fibrillation on Eliquis. History of orthopnea and cannot lay flat.  Has been having more distended veins in her neck. Has been having some diarrhea no blood in stool Has been losing a lot of weight.  Has been having decreased appetite. Unclear if has been getting any therapy for her lung cancer last imaging was done in 2021 showed multiple pulmonary nodules Pt never smokes  On 3L o2 at baseline  Was supposed to have palliative care but they have moved out of state Discussed at length goals of care with family and patient at this point they would like to concentrate mainly on comfort care would like to avoid aggressive interventions    Initial COVID TEST  NEGATIVE   Lab Results  Component Value Date   [place] 07/11/2022   [place] NEGATIVE 06/18/2019     Regarding  pertinent Chronic problems:     Hyperlipidemia - on statins Lipitor (atorvastatin)  Lipid Panel     Component Value Date/Time   CHOL 139 06/25/2019 0934   TRIG 49 06/25/2019 0934   HDL 70 06/25/2019 0934   CHOLHDL 2.0 06/25/2019 0934   LDLCALC 55 06/25/2019 0934     HTN Lasix spironolactone   chronic CHF diastolic/ - last echo 50 to 55%  On lasix and spironolactone   CAD  - On Aspirin, statin,                  -  followed by cardiology     Hx of Lung cancer on 3L o2 pt does not wish treatment   A. Fib -  - CHA2DS2 vas score   5       current  on anticoagulation with  Eliquis,           -  Rate control:       Chronic anemia - baseline hg Hemoglobin & Hematocrit  Recent Labs    07/11/22 1518  HGB 9.6*     While in ER:   Trop 26   CXR - Right upper lobe opacity is noted concerning for pneumonia.    CTA chest -  non-acute, no PE, Interval growth of multifocal bilateral lung masses and solid pulmonary nodules, compatible with significant progression of pulmonary metastatic disease since 10/15/2020 chest CT.  suggesting right heart failure. suggesting chronic pulmonary arterial hypertension. Moderate patchy ground-glass opacity and interlobular septal thickening superimposed throughout both lungs, increased from prior chest CT, favor superimposed cardiogenic pulmonary edema.  Following Medications were ordered in ER: Medications  furosemide (LASIX) injection 40 mg (has no administration in time range)  iohexol (OMNIPAQUE) 350 MG/ML injection 80 mL (80 mLs Intravenous Contrast Given 07/11/22 1922)    _______________________________________________________ ER Provider Called:    Cardiology  Dr.Turner They Recommend admit to medicine       ED Triage Vitals  Enc Vitals Group     BP 07/11/22 1507 125/60     Pulse Rate 07/11/22 1507 (!) 53     Resp 07/11/22 1507 (!) 22     Temp 07/11/22 1507 98.4 F (36.9 C)     Temp Source 07/11/22 1507 Oral     SpO2 07/11/22  1507 96 %     Weight 07/11/22 1501 127 lb (57.6 kg)     Height 07/11/22 1501 5\' 2"  (1.575 m)     Head Circumference --      Peak Flow --      Pain Score 07/11/22 1501 8     Pain Loc --      Pain Edu? --      Excl. in [place]? --   TMAX(24)@     _________________________________________ Significant initial  Findings: Abnormal Labs Reviewed  CBC WITH DIFFERENTIAL/PLATELET - Abnormal; Notable for the following components:      Result Value   RBC 3.37 (*)    Hemoglobin 9.6 (*)    HCT 32.2 (*)    MCHC 29.8 (*)    All other components within normal limits  COMPREHENSIVE METABOLIC PANEL - Abnormal; Notable for the following components:   Chloride 97 (*)    CO2 35 (*)    Glucose, Bld 110 (*)    Albumin 3.1 (*)    All other components within normal limits  BRAIN NATRIURETIC PEPTIDE - Abnormal; Notable for the following components:   B Natriuretic Peptide 656.1 (*)    All other components within normal limits  CK - Abnormal; Notable for the following components:   Total CK 29 (*)    All other components within normal limits  VITAMIN B12 - Abnormal; Notable for the following components:   Vitamin B-12 6,121 (*)    All other components within normal limits  IRON AND TIBC - Abnormal; Notable for the following components:   Saturation Ratios 10 (*)    All other components within normal limits  RETICULOCYTES - Abnormal; Notable for the following components:   RBC. 3.47 (*)    All other components within normal limits  I-STAT VENOUS BLOOD GAS, ED - Abnormal; Notable for the following components:   pCO2, Ven 68.8 (*)    pO2, Ven 26 (*)    Bicarbonate 38.4 (*)    TCO2 40 (*)    Acid-Base Excess 11.0 (*)    HCT 33.0 (*)    Hemoglobin 11.2 (*)    All other components within normal limits  TROPONIN I (HIGH SENSITIVITY) - Abnormal; Notable for the following components:   Troponin I (High Sensitivity) 29 (*)    All other components within normal limits  TROPONIN I (HIGH SENSITIVITY) -  Abnormal; Notable for the following components:   Troponin I (High Sensitivity) 26 (*)    All other components within normal limits  TROPONIN I (HIGH SENSITIVITY) - Abnormal; Notable for the following components:   Troponin I (  High Sensitivity) 28 (*)    All other components within normal limits  TROPONIN I (HIGH SENSITIVITY) - Abnormal; Notable for the following components:   Troponin I (High Sensitivity) 29 (*)    All other components within normal limits     _________________________ Troponin 26-29 ECG: Ordered Personally reviewed and interpreted by me showing: HR : 67 Rhythm: Atrial fibrillation Nonspecific intraventricular conduction delay Inferior infarct, acute (LCx) Anterior infarct, old QTC 439   _____    The recent clinical data is shown below. Vitals:   07/11/22 1925 07/11/22 1945 07/11/22 2015 07/11/22 2030  BP:      Pulse: 61 (!) 49 98 (!) 39  Resp: (!) 22 (!) 21 (!) 24 (!) 30  Temp:      TempSrc:      SpO2: 99% 100% 100% 99%  Weight:      Height:        WBC     Component Value Date/Time   WBC 4.6 07/11/2022 1518   LYMPHSABS 0.7 07/11/2022 1518   MONOABS 0.6 07/11/2022 1518   EOSABS 0.1 07/11/2022 1518   BASOSABS 0.0 07/11/2022 1518        UA  ordered    Results for orders placed or performed during the hospital encounter of 07/11/22  SARS Coronavirus 2 by RT PCR (hospital order, performed in [place] hospital lab) *cepheid single result test* Anterior Nasal Swab     Status: None   Collection Time: 07/11/22  2:46 PM   Specimen: Anterior Nasal Swab  Result Value Ref Range Status   SARS Coronavirus 2 by RT PCR NEGATIVE NEGATIVE Final         ____________________________________________ Hospitalist was called for admission for    Acute on chronic right-sided heart failure ([place])     The following Work up has been ordered so far:  Orders Placed This Encounter  Procedures   SARS Coronavirus 2 by RT PCR (hospital order, performed in [place] hospital lab) *cepheid single result test* Anterior Nasal Swab   DG Chest 2 View   CT Angio Chest PE W and/or Wo Contrast   CBC with Differential   Comprehensive metabolic panel   Lipase, blood   Brain natriuretic peptide   Consult to hospitalist   Airborne and Contact precautions   ED EKG   EKG 12-Lead   EKG 12-Lead   EKG 12-Lead     OTHER Significant initial  Findings:  labs showing:    Recent Labs  Lab 07/11/22 1518  NA 140  K 4.5  CO2 35*  GLUCOSE 110*  BUN 21  CREATININE 0.66  CALCIUM 9.2    Cr   stable,  Lab Results  Component Value Date   CREATININE 0.66 07/11/2022   CREATININE 0.67 02/18/2021   CREATININE 0.76 10/13/2020    Recent Labs  Lab 07/11/22 1518  AST 22  ALT 18  ALKPHOS 56  BILITOT 0.9  PROT 6.9  ALBUMIN 3.1*   Lab Results  Component Value Date   CALCIUM 9.2 07/11/2022       Plt: Lab Results  Component Value Date   PLT 155 07/11/2022         Recent Labs  Lab 07/11/22 1518  WBC 4.6  NEUTROABS 3.2  HGB 9.6*  HCT 32.2*  MCV 95.5  PLT 155    HG/HCT  stable,       Component Value Date/Time   HGB 9.6 (L) 07/11/2022 1518   HGB 10.8 (L) 06/17/2019  1152   HCT 32.2 (L) 07/11/2022 1518   HCT 32.8 (L) 06/17/2019 1152   MCV 95.5 07/11/2022 1518   MCV 89 06/17/2019 1152      Recent Labs  Lab 07/11/22 1518  LIPASE 38     BNP (last 3 results) Recent Labs    07/11/22 1518  BNP 656.1*       Cultures: No results found for: "SDES", "SPECREQUEST", "CULT", "REPTSTATUS"   Radiological Exams on Admission: CT HEAD [place] (5MM)  Result Date: 07/11/2022 CLINICAL DATA:  Vertigo, central EXAM: CT HEAD WITHOUT CONTRAST TECHNIQUE: Contiguous axial images were obtained from the base of the skull through the vertex without intravenous contrast. RADIATION DOSE REDUCTION: This exam was performed according to the departmental dose-optimization program which includes automated exposure control, adjustment of the mA and/or  kV according to patient size and/or use of iterative reconstruction technique. COMPARISON:  CT head 02/18/2021 FINDINGS: Brain: No evidence of large-territorial acute infarction. No parenchymal hemorrhage. No mass lesion. No extra-axial collection. No mass effect or midline shift. No hydrocephalus. Basilar cisterns are patent. Empty sella.  Chiari 1 malformation again noted. Vascular: No hyperdense vessel. Skull: No acute fracture or focal lesion. Sinuses/Orbits: Paranasal sinuses and mastoid air cells are clear. Bilateral lens replacement. Otherwise the orbits are unremarkable. Other: None. IMPRESSION: 1. No acute intracranial abnormality. 2. Empty sella. Findings is often a normal anatomic variant but can be associated with idiopathic intracranial hypertension (pseudotumor cerebri). Electronically Signed   By: Iven Finn M.D.   On: 07/11/2022 22:53   CT Angio Chest PE W and/or Wo Contrast  Result Date: 07/11/2022 CLINICAL DATA:  Stage IV lung cancer per prior imaging studies. Dyspnea. * Tracking Code: BO * EXAM: CT ANGIOGRAPHY CHEST WITH CONTRAST TECHNIQUE: Multidetector CT imaging of the chest was performed using the standard protocol during bolus administration of intravenous contrast. Multiplanar CT image reconstructions and MIPs were obtained to evaluate the vascular anatomy. RADIATION DOSE REDUCTION: This exam was performed according to the departmental dose-optimization program which includes automated exposure control, adjustment of the mA and/or kV according to patient size and/or use of iterative reconstruction technique. CONTRAST:  64mL OMNIPAQUE IOHEXOL 350 MG/ML SOLN COMPARISON:  10/15/2020 chest CT angiogram. FINDINGS: Cardiovascular: The study is moderate quality for the evaluation of pulmonary embolism, with some motion degradation. There are no convincing filling defects in the central, lobar, segmental or subsegmental pulmonary artery branches to suggest acute pulmonary embolism.  Atherosclerotic nonaneurysmal thoracic aorta. Dilated main pulmonary artery (4.7 cm diameter). Moderate to marked cardiomegaly. No significant pericardial fluid/thickening. Left anterior descending and left circumflex coronary atherosclerosis. Mediastinum/Nodes: No discrete thyroid nodules. Unremarkable esophagus. No axillary adenopathy. Enlarged 1.6 cm high right paraesophageal node (series 5/image 26), unchanged from 10/15/2020 CT. No newly pathologically enlarged mediastinal nodes. No discrete hilar adenopathy. Lungs/Pleura: No pneumothorax. No pleural effusion. Multifocal lung masses and solid pulmonary nodules are increased from 10/15/2020 chest CT. Representative cavitary 4.4 x 2.8 cm posterior right lower lobe lung mass (series 7/image 79), significantly increased from 2.1 x 1.3 cm. Representative 3.8 x 2.3 cm solid posterior left lower lobe lung mass (series 7/image 87), significantly increased from 2.1 x 1.7 cm. Representative 2.3 x 2.2 cm superior segment right lower lobe pulmonary nodule (series 7/image 39), increased from 0.8 x 0.6 cm. Moderate patchy ground-glass opacity and interlobular septal thickening superimposed throughout both lungs is increased from prior chest CT. Geographic consolidation in the perihilar right mid lung with associated bronchiectasis, volume loss and distortion is similar.  Upper abdomen: Mild contrast reflux into the IVC. Musculoskeletal: No aggressive appearing focal osseous lesions. Marked thoracic spondylosis. Review of the MIP images confirms the above findings. IMPRESSION: 1. No evidence of acute pulmonary embolism. 2. Interval growth of multifocal bilateral lung masses and solid pulmonary nodules, compatible with significant progression of pulmonary metastatic disease since 10/15/2020 chest CT. 3. Moderate to marked cardiomegaly. Mild contrast reflux into the IVC, suggesting right heart failure. 4. Prominently dilated main pulmonary artery, suggesting chronic pulmonary  arterial hypertension. 5. Moderate patchy ground-glass opacity and interlobular septal thickening superimposed throughout both lungs, increased from prior chest CT, favor superimposed cardiogenic pulmonary edema. 6. Two-vessel coronary atherosclerosis. 7. Chronic post treatment change in the parahilar right mid lung. 8. Aortic Atherosclerosis (ICD10-I70.0). Electronically Signed   By: Ilona Sorrel M.D.   On: 07/11/2022 19:42   DG Chest 2 View  Result Date: 07/11/2022 CLINICAL DATA:  Shortness of breath. EXAM: CHEST - 2 VIEW COMPARISON:  None Available. FINDINGS: Mild cardiomegaly is noted. Right upper lobe opacity is noted concerning for pneumonia. Left lingular nodular opacity is noted which may represent pneumonia, but neoplasm cannot be excluded. Probable bibasilar scarring is noted. Probable small bilateral pleural effusions are noted. Bony thorax is unremarkable. IMPRESSION: Right upper lobe opacity is noted concerning for pneumonia. Left lingular nodular opacity is noted which may represent focal pneumonia or inflammation, but neoplasm cannot be excluded. CT scan of the chest with intravenous contrast is recommended for further evaluation. Electronically Signed   By: Marijo Conception M.D.   On: 07/11/2022 15:59   _______________________________________________________________________________________________________ Latest   Blood pressure (!) 152/53, pulse (!) 39, temperature 98.3 F (36.8 C), temperature source Oral, resp. rate (!) 30, height 5\' 2"  (1.575 m), weight 57.6 kg, SpO2 99 %.   Vitals  labs and radiology finding personally reviewed  Review of Systems:    Pertinent positives include:  shortness of breath at rest.   dyspnea on exertion,  Bilateral lower extremity swelling  Constitutional:  No weight loss, night sweats, Fevers, chills, fatigue, weight loss  HEENT:  No headaches, Difficulty swallowing,Tooth/dental problems,Sore throat,  No sneezing, itching, ear ache, nasal congestion,  post nasal drip,  Cardio-vascular:  No chest pain, Orthopnea, PND, anasarca, dizziness, palpitations.no  GI:  No heartburn, indigestion, abdominal pain, nausea, vomiting, diarrhea, change in bowel habits, loss of appetite, melena, blood in stool, hematemesis Resp:  no No excess mucus, no productive cough, No non-productive cough, No coughing up of blood.No change in color of mucus.No wheezing. Skin:  no rash or lesions. No jaundice GU:  no dysuria, change in color of urine, no urgency or frequency. No straining to urinate.  No flank pain.  Musculoskeletal:  No joint pain or no joint swelling. No decreased range of motion. No back pain.  Psych:  No change in mood or affect. No depression or anxiety. No memory loss.  Neuro: no localizing neurological complaints, no tingling, no weakness, no double vision, no gait abnormality, no slurred speech, no confusion  All systems reviewed and apart from [place] all are negative _______________________________________________________________________________________________ Past Medical History:   Past Medical History:  Diagnosis Date   Alzheimer disease ([place])    Per new patient packet- [place]    Atrial fibrillation ([place]) 2006   Per Records received from [facility])    Per new patient packet- [facility]    Low oxygen saturation    Per new patient packet- [place]    Lung cancer ([place])  Per new patient packet- [facility]    On home oxygen therapy    Per Records received from [facility]   Pneumonia    Per new patient packet- [place]     Past Surgical History:  Procedure Laterality Date   PARTIAL HYSTERECTOMY     Still with ovaries, Per new patient packet- [facility]    RIGHT/LEFT HEART CATH AND CORONARY ANGIOGRAPHY N/A 06/21/2019   Procedure: RIGHT/LEFT HEART CATH AND CORONARY ANGIOGRAPHY;  Surgeon: [name], [name], MD;  Location: [facility];  Service: Cardiovascular;  Laterality: N/A;    Social History:  Ambulatory   independently        reports that she has never smoked. She has never used smokeless tobacco. She reports that she does not drink alcohol and does not use drugs.     Family History:   Family History  Problem Relation Age of Onset   Alzheimer's disease Mother    Alzheimer's disease Father    ______________________________________________________________________________________________ Allergies: No Known Allergies   Prior to Admission medications   Medication Sig Start Date End Date Taking? Authorizing Provider  acetaminophen-codeine (TYLENOL #3) 300-30 MG tablet Take 1 tablet by mouth every 6 (six) hours as needed for moderate pain. 11/17/21   [name], NP  Albuterol Sulfate (PROAIR RESPICLICK) 322 (90 Base) MCG/ACT AEPB Inhale 1 puff into the lungs daily as needed (For shortness of breath). 10/06/20 10/06/21  [provider]  apixaban (ELIQUIS) 5 MG TABS tablet Take 1 tablet (5 mg total) by mouth 2 (two) times daily. 09/14/21   Deberah Pelton, NP  atorvastatin (LIPITOR) 20 MG tablet Take 1 tablet (20 mg total) by mouth daily. 01/19/22   Deberah Pelton, NP  Cholecalciferol 50 MCG (2000 UT) TABS Take 2,000 Units by mouth daily at 8 pm.     [provider]  furosemide (LASIX) 20 MG tablet Take 1 tablet (20 mg total) by mouth daily. Keep the appointment for future refills. 04/05/22   Deberah Pelton, NP  OXYGEN Inhale 3 L into the lungs at bedtime.     [provider]  predniSONE (STERAPRED UNI-PAK 21 TAB) 10 MG (21) TBPK tablet Take by mouth daily. Take 6 tabs by mouth daily  for 2 days, then 5 tabs for 2 days, then 4 tabs for 2 days, then 3 tabs for 2 days, 2 tabs for 2 days, then 1 tab by mouth daily for 2 days 11/26/21   Marney Setting, NP  spironolactone (ALDACTONE) 25 MG tablet Take 1 tablet (25 mg total) by mouth daily. 05/26/22   [name], MD  vitamin B-12 (CYANOCOBALAMIN) 1000 MCG tablet Take 1,000 mcg by mouth daily.    [provider]   rivaroxaban (XARELTO) 20 MG TABS tablet Take 1 tablet (20 mg total) by mouth daily with supper. 06/26/20 10/19/20  [name], MD    ___________________________________________________________________________________________________ Physical Exam:    07/11/2022    8:30 PM 07/11/2022    8:15 PM 07/11/2022    7:45 PM  Vitals with BMI  Pulse 39 98 49     1. General:  in No  Acute distress   Chronically ill   -appearing 2. Psychological: Alert and   Oriented 3. Head/ENT:   Moist   Mucous Membranes                          Head Non traumatic, neck supple  Poor Dentition 4. SKIN: normal   Skin turgor,  Skin clean Dry and intact no rash 5. Heart: Regular rate and rhythm loud systolic cardiac Murmur, no Rub or gallop 6. Lungs:  , no wheezes or crackles   7. Abdomen: Soft,  non-tender, Non distended bowel sounds present 8. Lower extremities: no clubbing, cyanosis, trace edema 9. Neurologically Grossly intact, moving all 4 extremities equally   10. MSK: Normal range of motion    Chart has been reviewed  ______________________________________________________________________________________________  Assessment/Plan 81 y.o. female with medical history significant of A-fib on Eliquis, lung cancer, she will be aortic regurgitation, chronic diastolic CHF, HLD, OSA, CAD, dementia   Admitted for   Acute dyspnea   Acute on chronic right-sided heart failure ([place])     Present on Admission:  Right heart failure (HCC)  Atrial fibrillation (HCC)  Hyperlipidemia  Lung cancer ([place])  Abnormal EKG     Atrial fibrillation ([place]) continue Eliquis Currently bradycardic.  Discussed with family at this point would like to concentrate on comfort discontinue telemetry    Right heart failure [facility]) Cardiology is aware Family declined echogram Troponin stable Palliative care consult concentrate on comfort  Hyperlipidemia Chronic stable cont lipitor 20mg  po q  day  Lung cancer ([place]) Diffuse progressive pulmonary nodules worrisome for progression of known lung cancer patient and family does not wish to proceed leaving need further interventions like to concentrate on comfort palliative care consult in a.m. if patient continues to have dyspnea may benefit from as needed morphine  Abnormal EKG   kg concerning for acute ischemia but patient denies any chest pain troponin is stable At this point patient is comfort care only as per family and patient.  We will hold off on the further EKGs or cardiac monitoring   Other plan as per orders.  DVT prophylaxis: on eliquis    Code Status: DNR/DNI  comfort care as per patient  family  I had personally discussed CODE STATUS with patient and family     Family Communication:   Family  at  Bedside  plan of care was discussed  with  Daughter,   Disposition Plan                           To home once workup is complete and patient is stable   Following barriers for discharge:                            Electrolytes corrected                                                     Will need consultants to evaluate patient prior to discharge                       Would benefit from PT/OT eval prior to DC  Ordered                                       Transition of care consulted                   Nutrition    consulted  Palliative care    consulted                                       Consults called :   emailed cardiology   Admission status:  ED Disposition     ED Disposition  [place]: [place] [[id number]]  Level of Care: Progressive [[id number]]  Admit to Progressive based on following criteria: CARDIOVASCULAR & THORACIC of moderate stability with acute coronary syndrome symptoms/low risk myocardial infarction/hypertensive urgency/arrhythmias/heart failure potentially compromising stability and stable post  cardiovascular intervention patients.  May admit patient to [facility] or [facility] if equivalent level of care is available:: No  Covid Evaluation: Confirmed COVID Negative  Diagnosis: Right heart failure [facility]) [[id number]]  Admitting Physician: Toy Baker [[id number]]  Attending Physician: Toy Baker [[id number]]  Certification:: I certify this patient will need inpatient services for at least 2 midnights  Estimated Length of Stay: 2           inpatient     I Expect 2 midnight stay secondary to severity of patient's current illness need for inpatient interventions justified by the following:  hemodynamic instability despite optimal treatment ( bradycardia)  severe lab/radiological/exam abnormalities including:   Multiple pulmonary nodules and extensive comorbidities including:  CHF   CAD dementia    Chronic anticoagulation  That are currently affecting medical management.   I expect  patient to be hospitalized for 2 midnights requiring inpatient medical care.  Patient is at high risk for adverse outcome (such as loss of life or disability) if not treated.  Indication for inpatient stay as follows:   Need for IV diuretics   Level of care        progressive tele indefinitely please discontinue once patient no longer qualifies COVID-19 Labs    Lab Results  Component Value Date   [place] 07/11/2022     Precautions: admitted as   Covid Negative       Varie Machamer 07/12/2022, 1:28 AM    [facility]     after 2 AM please page floor coverage PA If 7AM-7PM, please contact the day team taking care of the patient using [email or web address]   Patient was evaluated in the context of the global COVID-19 pandemic, which necessitated consideration that the patient might be at risk for infection with the SARS-CoV-2 virus that causes COVID-19. Institutional protocols and algorithms that pertain to the evaluation of patients at risk for COVID-19 are in a  state of rapid change based on information released by regulatory bodies including the CDC and federal and state organizations. These policies and algorithms were followed during the patient's care.

## 2022-07-11 NOTE — Assessment & Plan Note (Signed)
Chronic stable cont lipitor 20mg  po q day

## 2022-07-11 NOTE — ED Provider Triage Note (Signed)
Emergency Medicine Provider Triage Evaluation Note  Valerie [name] , a 81 y.o. female  was evaluated in triage.  Pt complains of multiple complaints. Dizziness, CP, SOB, abd pain, diarrhea. On Eliquis. Some LE swelling  Review of Systems  Positive: Dizziness, cp, sob Negative:   Physical Exam  There were no vitals taken for this visit. Gen:   Awake, no distress   Resp:  Normal effort  MSK:   Moves extremities without difficulty  Other:    Medical Decision Making  Medically screening exam initiated at 2:59 PM.  Appropriate orders placed.  Valerie [name] was informed that the remainder of the evaluation will be completed by another provider, this initial triage assessment does not replace that evaluation, and the importance of remaining in the ED until their evaluation is complete.  Dizziness, CP, le swelling, diarrhea   Valerie [name] A, PA-C 07/11/22 1502

## 2022-07-11 NOTE — Subjective & Objective (Signed)
Patient presents with shortness of breath chest pain and dizziness as well as abdominal pain for the past 4 days she is supposed to be on 3 L of oxygen but has not been compliant.  Has been having bilateral leg edema patient with known history of lung cancer palliative care Has been describing vertigo.  Shortness of breath with exertion as well. Raquel Sarna states that she has persistent lower extremity swelling Known history of atrial fibrillation on Eliquis. History of orthopnea and cannot lay flat.  Has been having more distended veins in her neck. Has been having some diarrhea no blood in stool Has been losing a lot of weight.  Has been having decreased appetite. Unclear if has been getting any therapy for her lung cancer last imaging was done in 2021 showed multiple pulmonary nodules

## 2022-07-11 NOTE — Assessment & Plan Note (Signed)
continue Eliquis Currently bradycardic.  Discussed with family at this point would like to concentrate on comfort discontinue telemetry

## 2022-07-11 NOTE — Telephone Encounter (Signed)
Will forward to provider  

## 2022-07-11 NOTE — Telephone Encounter (Signed)
Received a communication from patient's daughter, Kienna, inquiring when next palliative care appointment will be scheduled. I attempted to leave a voicemail, however she has a voicemail that has not been set up yet. Will continue to attempt contact.

## 2022-07-11 NOTE — ED Provider Notes (Signed)
[facility] Provider Note   CSN: [id number] Arrival date & time: 07/11/22  1446     History  Chief Complaint  Patient presents with   Dizziness   Shortness of Breath   Chest Pain   Abdominal Pain    Valerie [name] is a 81 y.o. female.  Bed 15C -- Valerie [name] is an 14F with PMH significant for severe AI/AS, longstanding afib with slow ventricular response on eliquis, CAD s/p cath in 2020 with presumed RCA occlusion, recurrent lung cancer on nightly 3L O2 at home who presents with increased shortness of breath, chest/abdominal pain, and dizziness for the past two days. She endorses increased shortness of breath and dizziness since Saturday 9/16, particularly with exertion. She also reports chest pain, palpitations and mild poorly localized abdominal pain. She has a long history of orthopnea and cannot lay flat. Her family notes that she has a prominent veins in her neck that are new, and her ankles have been swollen. She has had diarrhea but does not report gross blood or black discoloration.  She also reports dizziness. She reports the room feels like it is spinning but denies tinnitus. She denies new focal weakness. She has a significant lung cancer history. Her family reports that she initially had a nodule detected on CXR that was treated but subsequently recurred in bilateral lungs a few years ago. Last chest CT available (09/2020) demonstrated multiple pulmonary nodules throughout lungs. The family reports significant weight loss from 250 lbs at diagnosis, with associated nausea and decreased appetite.        Home Medications Prior to Admission medications   Medication Sig Start Date End Date Taking? Authorizing Provider  acetaminophen-codeine (TYLENOL #3) 300-30 MG tablet Take 1 tablet by mouth every 6 (six) hours as needed for moderate pain. 11/17/21   [name], NP  Albuterol Sulfate (PROAIR RESPICLICK) 856 (90 Base) MCG/ACT AEPB Inhale  1 puff into the lungs daily as needed (For shortness of breath). 10/06/20 10/06/21  [provider]  apixaban (ELIQUIS) 5 MG TABS tablet Take 1 tablet (5 mg total) by mouth 2 (two) times daily. 09/14/21   Deberah Pelton, NP  atorvastatin (LIPITOR) 20 MG tablet Take 1 tablet (20 mg total) by mouth daily. 01/19/22   Deberah Pelton, NP  Cholecalciferol 50 MCG (2000 UT) TABS Take 2,000 Units by mouth daily at 8 pm.     [provider]  furosemide (LASIX) 20 MG tablet Take 1 tablet (20 mg total) by mouth daily. Keep the appointment for future refills. 04/05/22   Deberah Pelton, NP  OXYGEN Inhale 3 L into the lungs at bedtime.     [provider]  predniSONE (STERAPRED UNI-PAK 21 TAB) 10 MG (21) TBPK tablet Take by mouth daily. Take 6 tabs by mouth daily  for 2 days, then 5 tabs for 2 days, then 4 tabs for 2 days, then 3 tabs for 2 days, 2 tabs for 2 days, then 1 tab by mouth daily for 2 days 11/26/21   Marney Setting, NP  spironolactone (ALDACTONE) 25 MG tablet Take 1 tablet (25 mg total) by mouth daily. 05/26/22   [name], MD  vitamin B-12 (CYANOCOBALAMIN) 1000 MCG tablet Take 1,000 mcg by mouth daily.    [provider]  rivaroxaban (XARELTO) 20 MG TABS tablet Take 1 tablet (20 mg total) by mouth daily with supper. 06/26/20 10/19/20  [name], MD  Allergies    Patient has no known allergies.    Review of Systems   Review of Systems  Physical Exam Updated Vital Signs BP (!) 152/53   Pulse (!) 39   Temp 98.3 F (36.8 C) (Oral)   Resp (!) 30   Ht 5\' 2"  (1.575 m)   Wt 57.6 kg   SpO2 99%   BMI 23.23 kg/m  Physical Exam Vitals and nursing note reviewed.  Constitutional:      General: She is not in acute distress.    Appearance: She is well-developed.  HENT:     Head: Normocephalic and atraumatic.     Mouth/Throat:     Mouth: Mucous membranes are moist.  Eyes:     General:        Right eye: No discharge.        Left eye:  No discharge.     Conjunctiva/sclera: Conjunctivae normal.  Neck:     Trachea: No tracheal deviation.  Cardiovascular:     Rate and Rhythm: Regular rhythm. Bradycardia present.     Heart sounds: Murmur (4+ upper sternal SM) heard.  Pulmonary:     Effort: Tachypnea present.     Breath sounds: Examination of the right-middle field reveals rales. Rales present.  Abdominal:     General: There is no distension.     Palpations: Abdomen is soft.     Tenderness: There is no abdominal tenderness. There is no guarding.  Musculoskeletal:     Cervical back: Normal range of motion and neck supple. No rigidity.     Right lower leg: Edema (mild LEs) present.     Left lower leg: Edema present.  Skin:    General: Skin is warm.     Capillary Refill: Capillary refill takes less than 2 seconds.     Findings: No rash.  Neurological:     General: No focal deficit present.     Mental Status: She is alert.     Cranial Nerves: No cranial nerve deficit.     Comments: Pleasant dementia  Psychiatric:        Mood and Affect: Mood normal.     Comments: Pleasant dementia     ED Results / Procedures / Treatments   Labs (all labs ordered are listed, but only abnormal results are displayed) Labs Reviewed  CBC WITH DIFFERENTIAL/PLATELET - Abnormal; Notable for the following components:      Result Value   RBC 3.37 (*)    Hemoglobin 9.6 (*)    HCT 32.2 (*)    MCHC 29.8 (*)    All other components within normal limits  COMPREHENSIVE METABOLIC PANEL - Abnormal; Notable for the following components:   Chloride 97 (*)    CO2 35 (*)    Glucose, Bld 110 (*)    Albumin 3.1 (*)    All other components within normal limits  BRAIN NATRIURETIC PEPTIDE - Abnormal; Notable for the following components:   B Natriuretic Peptide 656.1 (*)    All other components within normal limits  TROPONIN I (HIGH SENSITIVITY) - Abnormal; Notable for the following components:   Troponin I (High Sensitivity) 29 (*)    All other  components within normal limits  TROPONIN I (HIGH SENSITIVITY) - Abnormal; Notable for the following components:   Troponin I (High Sensitivity) 26 (*)    All other components within normal limits  SARS CORONAVIRUS 2 BY RT PCR  LIPASE, BLOOD  BLOOD GAS, VENOUS  CK  MAGNESIUM  PHOSPHORUS  TSH  VITAMIN B12  FOLATE  IRON AND TIBC  FERRITIN  RETICULOCYTES  HEMOGLOBIN A1C  TROPONIN I (HIGH SENSITIVITY)  TROPONIN I (HIGH SENSITIVITY)    EKG None  Radiology CT HEAD WO CONTRAST (5MM)  Result Date: 07/11/2022 CLINICAL DATA:  Vertigo, central EXAM: CT HEAD WITHOUT CONTRAST TECHNIQUE: Contiguous axial images were obtained from the base of the skull through the vertex without intravenous contrast. RADIATION DOSE REDUCTION: This exam was performed according to the departmental dose-optimization program which includes automated exposure control, adjustment of the mA and/or kV according to patient size and/or use of iterative reconstruction technique. COMPARISON:  CT head 02/18/2021 FINDINGS: Brain: No evidence of large-territorial acute infarction. No parenchymal hemorrhage. No mass lesion. No extra-axial collection. No mass effect or midline shift. No hydrocephalus. Basilar cisterns are patent. Empty sella.  Chiari 1 malformation again noted. Vascular: No hyperdense vessel. Skull: No acute fracture or focal lesion. Sinuses/Orbits: Paranasal sinuses and mastoid air cells are clear. Bilateral lens replacement. Otherwise the orbits are unremarkable. Other: None. IMPRESSION: 1. No acute intracranial abnormality. 2. Empty sella. Findings is often a normal anatomic variant but can be associated with idiopathic intracranial hypertension (pseudotumor cerebri). Electronically Signed   By: Iven Finn M.D.   On: 07/11/2022 22:53   CT Angio Chest PE W and/or Wo Contrast  Result Date: 07/11/2022 CLINICAL DATA:  Stage IV lung cancer per prior imaging studies. Dyspnea. * Tracking Code: BO * EXAM: CT  ANGIOGRAPHY CHEST WITH CONTRAST TECHNIQUE: Multidetector CT imaging of the chest was performed using the standard protocol during bolus administration of intravenous contrast. Multiplanar CT image reconstructions and MIPs were obtained to evaluate the vascular anatomy. RADIATION DOSE REDUCTION: This exam was performed according to the departmental dose-optimization program which includes automated exposure control, adjustment of the mA and/or kV according to patient size and/or use of iterative reconstruction technique. CONTRAST:  12mL OMNIPAQUE IOHEXOL 350 MG/ML SOLN COMPARISON:  10/15/2020 chest CT angiogram. FINDINGS: Cardiovascular: The study is moderate quality for the evaluation of pulmonary embolism, with some motion degradation. There are no convincing filling defects in the central, lobar, segmental or subsegmental pulmonary artery branches to suggest acute pulmonary embolism. Atherosclerotic nonaneurysmal thoracic aorta. Dilated main pulmonary artery (4.7 cm diameter). Moderate to marked cardiomegaly. No significant pericardial fluid/thickening. Left anterior descending and left circumflex coronary atherosclerosis. Mediastinum/Nodes: No discrete thyroid nodules. Unremarkable esophagus. No axillary adenopathy. Enlarged 1.6 cm high right paraesophageal node (series 5/image 26), unchanged from 10/15/2020 CT. No newly pathologically enlarged mediastinal nodes. No discrete hilar adenopathy. Lungs/Pleura: No pneumothorax. No pleural effusion. Multifocal lung masses and solid pulmonary nodules are increased from 10/15/2020 chest CT. Representative cavitary 4.4 x 2.8 cm posterior right lower lobe lung mass (series 7/image 79), significantly increased from 2.1 x 1.3 cm. Representative 3.8 x 2.3 cm solid posterior left lower lobe lung mass (series 7/image 87), significantly increased from 2.1 x 1.7 cm. Representative 2.3 x 2.2 cm superior segment right lower lobe pulmonary nodule (series 7/image 39), increased from  0.8 x 0.6 cm. Moderate patchy ground-glass opacity and interlobular septal thickening superimposed throughout both lungs is increased from prior chest CT. Geographic consolidation in the perihilar right mid lung with associated bronchiectasis, volume loss and distortion is similar. Upper abdomen: Mild contrast reflux into the IVC. Musculoskeletal: No aggressive appearing focal osseous lesions. Marked thoracic spondylosis. Review of the MIP images confirms the above findings. IMPRESSION: 1. No evidence of acute pulmonary embolism. 2. Interval growth of multifocal bilateral lung masses and solid pulmonary  nodules, compatible with significant progression of pulmonary metastatic disease since 10/15/2020 chest CT. 3. Moderate to marked cardiomegaly. Mild contrast reflux into the IVC, suggesting right heart failure. 4. Prominently dilated main pulmonary artery, suggesting chronic pulmonary arterial hypertension. 5. Moderate patchy ground-glass opacity and interlobular septal thickening superimposed throughout both lungs, increased from prior chest CT, favor superimposed cardiogenic pulmonary edema. 6. Two-vessel coronary atherosclerosis. 7. Chronic post treatment change in the parahilar right mid lung. 8. Aortic Atherosclerosis (ICD10-I70.0). Electronically Signed   By: Ilona Sorrel M.D.   On: 07/11/2022 19:42   DG Chest 2 View  Result Date: 07/11/2022 CLINICAL DATA:  Shortness of breath. EXAM: CHEST - 2 VIEW COMPARISON:  None Available. FINDINGS: Mild cardiomegaly is noted. Right upper lobe opacity is noted concerning for pneumonia. Left lingular nodular opacity is noted which may represent pneumonia, but neoplasm cannot be excluded. Probable bibasilar scarring is noted. Probable small bilateral pleural effusions are noted. Bony thorax is unremarkable. IMPRESSION: Right upper lobe opacity is noted concerning for pneumonia. Left lingular nodular opacity is noted which may represent focal pneumonia or inflammation, but  neoplasm cannot be excluded. CT scan of the chest with intravenous contrast is recommended for further evaluation. Electronically Signed   By: Marijo Conception M.D.   On: 07/11/2022 15:59    Procedures .Critical Care  Performed by: Elnora Morrison, MD Authorized by: Elnora Morrison, MD   Critical care provider statement:    Critical care time (minutes):  30   Critical care start time:  07/11/2022 8:30 PM   Critical care end time:  07/11/2022 9:00 PM   Critical care time was exclusive of:  Separately billable procedures and treating other patients and teaching time   Critical care was necessary to treat or prevent imminent or life-threatening deterioration of the following conditions:  Respiratory failure   Critical care was time spent personally by me on the following activities:  Development of treatment plan with patient or surrogate, discussions with consultants, evaluation of patient's response to treatment, examination of patient, ordering and review of laboratory studies, ordering and review of radiographic studies, ordering and performing treatments and interventions, pulse oximetry, re-evaluation of patient's condition and review of old charts     Medications Ordered in ED Medications  iohexol (OMNIPAQUE) 350 MG/ML injection 80 mL (80 mLs Intravenous Contrast Given 07/11/22 1922)  furosemide (LASIX) injection 40 mg (40 mg Intravenous Given 07/11/22 2251)  aspirin chewable tablet 324 mg (324 mg Oral Given 07/11/22 2249)    ED Course/ Medical Decision Making/ A&P                           Medical Decision Making Amount and/or Complexity of Data Reviewed Radiology: ordered. ECG/medicine tests: ordered.  Risk OTC drugs. Prescription drug management. Decision regarding hospitalization.   She has been on 3L of O2 at home at night. Her family reports she has decided to forego further treatment for the lung cancer and is currently in palliative care. We discussed goals of care, and the  family and Ms. Mattalyn will discuss what treatments should would want to undergo pending workup.    Plan: - 1st hstrop 42; repeat pending. BNP 656. CHF vs. NSTEMI - CTA-PE pending to assess PE vs. lung cancer vs pneumonia.  On chart review she has significant aortic stenosis for which TAVR was recommended but was not performed due to patient preference in the setting of the recurrence of her lung cancer and  poor dentition.   Patient having significant exertional symptoms, fluid overload clinically, BNP elevated, troponin mild elevation.  Paged hospitalist for admission.  Lasix ordered.  Patient is amenable to general medical treatments however not surgical.  Discussed with cardiology for consult, recommended hospitalist admission and if they need cardiology then call in the morning given patient's extensive medical history, goals of care and not a procedure/surgical candidate.        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

## 2022-07-11 NOTE — ED Notes (Addendum)
EKG changes noted. Repeat EKG obtained. Doutova MD notified. At bedside.

## 2022-07-12 ENCOUNTER — Encounter (HOSPITAL_COMMUNITY): Payer: Self-pay | Admitting: Family Medicine

## 2022-07-12 DIAGNOSIS — I5033 Acute on chronic diastolic (congestive) heart failure: Secondary | ICD-10-CM

## 2022-07-12 DIAGNOSIS — R9431 Abnormal electrocardiogram [ECG] [EKG]: Secondary | ICD-10-CM | POA: Diagnosis not present

## 2022-07-12 DIAGNOSIS — C3490 Malignant neoplasm of unspecified part of unspecified bronchus or lung: Secondary | ICD-10-CM | POA: Diagnosis present

## 2022-07-12 DIAGNOSIS — I4821 Permanent atrial fibrillation: Secondary | ICD-10-CM | POA: Diagnosis not present

## 2022-07-12 DIAGNOSIS — J9611 Chronic respiratory failure with hypoxia: Secondary | ICD-10-CM | POA: Diagnosis not present

## 2022-07-12 DIAGNOSIS — C3480 Malignant neoplasm of overlapping sites of unspecified bronchus and lung: Secondary | ICD-10-CM | POA: Diagnosis not present

## 2022-07-12 DIAGNOSIS — F0390 Unspecified dementia without behavioral disturbance: Secondary | ICD-10-CM | POA: Diagnosis present

## 2022-07-12 DIAGNOSIS — I50813 Acute on chronic right heart failure: Secondary | ICD-10-CM | POA: Diagnosis not present

## 2022-07-12 DIAGNOSIS — I2583 Coronary atherosclerosis due to lipid rich plaque: Secondary | ICD-10-CM

## 2022-07-12 DIAGNOSIS — Z7189 Other specified counseling: Secondary | ICD-10-CM

## 2022-07-12 DIAGNOSIS — I2510 Atherosclerotic heart disease of native coronary artery without angina pectoris: Secondary | ICD-10-CM

## 2022-07-12 DIAGNOSIS — I351 Nonrheumatic aortic (valve) insufficiency: Secondary | ICD-10-CM | POA: Diagnosis not present

## 2022-07-12 DIAGNOSIS — D509 Iron deficiency anemia, unspecified: Secondary | ICD-10-CM | POA: Diagnosis present

## 2022-07-12 LAB — CBC
HCT: 30.8 % — ABNORMAL LOW (ref 36.0–46.0)
Hemoglobin: 9.4 g/dL — ABNORMAL LOW (ref 12.0–15.0)
MCH: 29.2 pg (ref 26.0–34.0)
MCHC: 30.5 g/dL (ref 30.0–36.0)
MCV: 95.7 fL (ref 80.0–100.0)
Platelets: 146 10*3/uL — ABNORMAL LOW (ref 150–400)
RBC: 3.22 MIL/uL — ABNORMAL LOW (ref 3.87–5.11)
RDW: 13.4 % (ref 11.5–15.5)
WBC: 4.5 10*3/uL (ref 4.0–10.5)
nRBC: 0 % (ref 0.0–0.2)

## 2022-07-12 LAB — COMPREHENSIVE METABOLIC PANEL
ALT: 14 U/L (ref 0–44)
AST: 16 U/L (ref 15–41)
Albumin: 2.3 g/dL — ABNORMAL LOW (ref 3.5–5.0)
Alkaline Phosphatase: 39 U/L (ref 38–126)
Anion gap: 4 — ABNORMAL LOW (ref 5–15)
BUN: 13 mg/dL (ref 8–23)
CO2: 31 mmol/L (ref 22–32)
Calcium: 6.9 mg/dL — ABNORMAL LOW (ref 8.9–10.3)
Chloride: 107 mmol/L (ref 98–111)
Creatinine, Ser: 0.52 mg/dL (ref 0.44–1.00)
GFR, Estimated: >60 mL/min (ref >60)
Glucose, Bld: 93 mg/dL (ref 70–99)
Potassium: 3.6 mmol/L (ref 3.5–5.1)
Sodium: 142 mmol/L (ref 135–145)
Total Bilirubin: 0.9 mg/dL (ref 0.3–1.2)
Total Protein: 5.2 g/dL — ABNORMAL LOW (ref 6.5–8.1)

## 2022-07-12 LAB — CK: Total CK: 29 U/L — ABNORMAL LOW (ref 38–234)

## 2022-07-12 LAB — FOLATE: Folate: 15.8 ng/mL (ref >5.9)

## 2022-07-12 LAB — VITAMIN B12: Vitamin B-12: 6121 pg/mL — ABNORMAL HIGH (ref 180–914)

## 2022-07-12 LAB — MAGNESIUM: Magnesium: 1.7 mg/dL (ref 1.7–2.4)

## 2022-07-12 LAB — IRON AND TIBC
Iron: 28 ug/dL (ref 28–170)
Saturation Ratios: 10 % — ABNORMAL LOW (ref 10.4–31.8)
TIBC: 279 ug/dL (ref 250–450)
UIBC: 251 ug/dL

## 2022-07-12 LAB — PHOSPHORUS: Phosphorus: 3 mg/dL (ref 2.5–4.6)

## 2022-07-12 LAB — TROPONIN I (HIGH SENSITIVITY): Troponin I (High Sensitivity): 28 ng/L — ABNORMAL HIGH (ref <18)

## 2022-07-12 LAB — TSH: TSH: 2.728 u[IU]/mL (ref 0.350–4.500)

## 2022-07-12 LAB — FERRITIN: Ferritin: 28 ng/mL (ref 11–307)

## 2022-07-12 MED ORDER — HYDROCODONE-ACETAMINOPHEN 5-325 MG PO TABS: 1.0000 | ORAL_TABLET | ORAL | Status: DC | PRN

## 2022-07-12 MED ORDER — APIXABAN 5 MG PO TABS
5.0000 mg | ORAL_TABLET | Freq: Two times a day (BID) | ORAL | Status: DC
  Administered 2022-07-12 – 2022-07-13 (×4): 5 mg via ORAL
  Filled 2022-07-12 (×5): qty 1

## 2022-07-12 MED ORDER — POTASSIUM CHLORIDE CRYS ER 20 MEQ PO TBCR
20.0000 meq | EXTENDED_RELEASE_TABLET | Freq: Every day | ORAL | Status: DC
  Administered 2022-07-12 – 2022-07-13 (×2): 20 meq via ORAL
  Filled 2022-07-12 (×2): qty 1

## 2022-07-12 MED ORDER — ALBUTEROL SULFATE (2.5 MG/3ML) 0.083% IN NEBU: 2.5000 mg | INHALATION_SOLUTION | RESPIRATORY_TRACT | Status: DC | PRN

## 2022-07-12 MED ORDER — ALBUTEROL SULFATE 108 (90 BASE) MCG/ACT IN AEPB: 1.0000 | INHALATION_SPRAY | Freq: Every day | RESPIRATORY_TRACT | Status: DC | PRN

## 2022-07-12 MED ORDER — ATORVASTATIN CALCIUM 10 MG PO TABS
20.0000 mg | ORAL_TABLET | Freq: Every day | ORAL | Status: DC
  Administered 2022-07-12 – 2022-07-13 (×2): 20 mg via ORAL
  Filled 2022-07-12 (×2): qty 2

## 2022-07-12 MED ORDER — POLYETHYLENE GLYCOL 3350 17 G PO PACK: 17.0000 g | PACK | Freq: Every day | ORAL | Status: DC | PRN

## 2022-07-12 MED ORDER — FERROUS SULFATE 325 (65 FE) MG PO TABS
325.0000 mg | ORAL_TABLET | ORAL | Status: DC
  Administered 2022-07-12: 325 mg via ORAL
  Filled 2022-07-12: qty 1

## 2022-07-12 MED ORDER — SODIUM CHLORIDE 0.9% FLUSH
3.0000 mL | Freq: Two times a day (BID) | INTRAVENOUS | Status: DC
  Administered 2022-07-12 – 2022-07-13 (×2): 3 mL via INTRAVENOUS

## 2022-07-12 MED ORDER — MAGNESIUM SULFATE IN D5W 1-5 GM/100ML-% IV SOLN
1.0000 g | Freq: Once | INTRAVENOUS | Status: AC
  Administered 2022-07-12: 1 g via INTRAVENOUS
  Filled 2022-07-12: qty 100

## 2022-07-12 MED ORDER — SENNA 8.6 MG PO TABS
1.0000 | ORAL_TABLET | Freq: Two times a day (BID) | ORAL | Status: DC
  Administered 2022-07-12 – 2022-07-13 (×2): 8.6 mg via ORAL
  Filled 2022-07-12 (×2): qty 1

## 2022-07-12 MED ORDER — SODIUM CHLORIDE 0.9 % IV SOLN: 250.0000 mL | INTRAVENOUS | Status: DC | PRN

## 2022-07-12 MED ORDER — SODIUM CHLORIDE 0.9% FLUSH: 3.0000 mL | INTRAVENOUS | Status: DC | PRN

## 2022-07-12 MED ORDER — FUROSEMIDE 10 MG/ML IJ SOLN
40.0000 mg | Freq: Two times a day (BID) | INTRAMUSCULAR | Status: DC
  Administered 2022-07-12 – 2022-07-13 (×3): 40 mg via INTRAVENOUS
  Filled 2022-07-12 (×3): qty 4

## 2022-07-12 MED ORDER — ACETAMINOPHEN 650 MG RE SUPP: 650.0000 mg | Freq: Four times a day (QID) | RECTAL | Status: DC | PRN

## 2022-07-12 MED ORDER — DOCUSATE SODIUM 100 MG PO CAPS
100.0000 mg | ORAL_CAPSULE | Freq: Two times a day (BID) | ORAL | Status: DC
  Administered 2022-07-12 – 2022-07-13 (×4): 100 mg via ORAL
  Filled 2022-07-12 (×4): qty 1

## 2022-07-12 MED ORDER — SPIRONOLACTONE 25 MG PO TABS
25.0000 mg | ORAL_TABLET | Freq: Every day | ORAL | Status: DC
  Administered 2022-07-12: 25 mg via ORAL
  Filled 2022-07-12: qty 1

## 2022-07-12 MED ORDER — ACETAMINOPHEN 325 MG PO TABS
650.0000 mg | ORAL_TABLET | Freq: Four times a day (QID) | ORAL | Status: DC | PRN
  Administered 2022-07-12: 650 mg via ORAL
  Filled 2022-07-12: qty 2

## 2022-07-12 NOTE — Assessment & Plan Note (Signed)
Monitor for any sign of sundowning patient currently stable

## 2022-07-12 NOTE — Assessment & Plan Note (Addendum)
Mild cognitive impairment.  Patient with no agitation

## 2022-07-12 NOTE — Evaluation (Signed)
Physical Therapy Evaluation Patient Details Name: Valerie [name] MRN: [id number] DOB: 11-04-1940 Today's Date: 07/12/2022  History of Present Illness  Pt is an 81 y/o female presenting with progressive SOB, chest/abdominal pain, dizziness and BLE edema. Admitted for further work up of heart failure, possible PNA. Pending palliative consult. PMH: a fib, lung CA, CHF, HLD, OSA, CAD, dementia.  Clinical Impression  Pt admitted secondary to problem above with deficits below. Requiring min guard for safety throughout mobility tasks this session. VSS throughout and pt tolerated well. Pt's daughters present and reports they can assist pt as needed at d/c. Discussed using RW at home when needed, especially when weak and dizzy, and having assist for steps. Recommending HHPT at d/c to address deficits. Will continue to follow acutely.        Recommendations for follow up therapy are one component of a multi-disciplinary discharge planning process, led by the attending physician.  Recommendations may be updated based on patient status, additional functional criteria and insurance authorization.  Follow Up Recommendations Home health PT      Assistance Recommended at Discharge Intermittent Supervision/Assistance  Patient can return home with the following  A little help with walking and/or transfers;A little help with bathing/dressing/bathroom;Assistance with cooking/housework;Help with stairs or ramp for entrance;Assist for transportation    Equipment Recommendations None recommended by PT  Recommendations for Other Services       Functional Status Assessment Patient has had a recent decline in their functional status and demonstrates the ability to make significant improvements in function in a reasonable and predictable amount of time.     Precautions / Restrictions Precautions Precautions: Fall Precaution Comments: monitor O2/HR Restrictions Weight Bearing Restrictions: No      Mobility   Bed Mobility Overal bed mobility: Modified Independent                  Transfers Overall transfer level: Needs assistance Equipment used: None Transfers: Sit to/from Stand, Bed to chair/wheelchair/BSC Sit to Stand: Min guard   Step pivot transfers: Min guard       General transfer comment: able to stand without assist from stretcher and BSC. min guard for safety in stepping to/from [facility] without AD though no LOB    Ambulation/Gait                  Stairs            Wheelchair Mobility    Modified Rankin (Stroke Patients Only)       Balance Overall balance assessment: Needs assistance Sitting-balance support: No upper extremity supported, Feet supported Sitting balance-Leahy Scale: Good     Standing balance support: No upper extremity supported, During functional activity, Single extremity supported Standing balance-Leahy Scale: Fair                               Pertinent Vitals/Pain Pain Assessment Pain Assessment: No/denies pain    Home Living Family/patient expects to be discharged to:: Private residence Living Arrangements: Children (2daughters, son in Sports coach and grandson) Available Help at Discharge: Family;Available 24 hours/day Type of Home: House Home Access: Stairs to enter Entrance Stairs-Rails: None Entrance Stairs-Number of Steps: 2 Alternate Level Stairs-Number of Steps: flight Home Layout: Two level;1/2 bath on main level Home Equipment: Shower seat;Wheelchair - Publishing copy (2 wheels) Additional Comments: Pt only comes down steps 1 time a week typically    Prior Function Prior Level of Function : Needs assist  Mobility Comments: Supervision for stair navigation, able to mobilize without AD ADLs Comments: Independent with ADLs. Daughter assists with cooking, cleaning     Hand Dominance   Dominant Hand: Right    Extremity/Trunk Assessment   Upper Extremity Assessment Upper Extremity  Assessment: Defer to OT evaluation    Lower Extremity Assessment Lower Extremity Assessment: Generalized weakness (swelling noted at bilateral ankles)    Cervical / Trunk Assessment Cervical / Trunk Assessment: Kyphotic  Communication   Communication: No difficulties;Other (comment);Prefers language other than English (Family assisting with interpreting as needed)  Cognition Arousal/Alertness: Awake/alert Behavior During Therapy: WFL for tasks assessed/performed Overall Cognitive Status: Within Functional Limits for tasks assessed                                          General Comments General comments (skin integrity, edema, etc.): Daughters present, supportive and assisting with interpreting as needed    Exercises     Assessment/Plan    PT Assessment Patient needs continued PT services  PT Problem List Decreased strength;Decreased activity tolerance;Decreased balance;Decreased mobility;Decreased knowledge of use of DME;Decreased knowledge of precautions       PT Treatment Interventions Gait training;DME instruction;Therapeutic activities;Stair training;Functional mobility training;Therapeutic exercise;Balance training;Patient/family education    PT Goals (Current goals can be found in the Care Plan section)  Acute Rehab PT Goals Patient Stated Goal: to go home PT Goal Formulation: With patient/family Time For Goal Achievement: 07/26/22 Potential to Achieve Goals: Good    Frequency Min 3X/week     Co-evaluation PT/OT/SLP Co-Evaluation/Treatment: Yes Reason for Co-Treatment: For patient/therapist safety;To address functional/ADL transfers PT goals addressed during session: Balance;Mobility/safety with mobility OT goals addressed during session: ADL's and self-care       AM-PAC PT "6 Clicks" Mobility  Outcome Measure Help needed turning from your back to your side while in a flat bed without using bedrails?: None Help needed moving from lying on  your back to sitting on the side of a flat bed without using bedrails?: None Help needed moving to and from a bed to a chair (including a wheelchair)?: A Little Help needed standing up from a chair using your arms (e.g., wheelchair or bedside chair)?: A Little Help needed to walk in hospital room?: A Little Help needed climbing 3-5 steps with a railing? : A Little 6 Click Score: 20    End of Session Equipment Utilized During Treatment: Gait belt Activity Tolerance: Patient tolerated treatment well Patient left: in bed;with call bell/phone within reach;with family/visitor present (on stretcher in ED) Nurse Communication: Mobility status PT Visit Diagnosis: Unsteadiness on feet (R26.81);Muscle weakness (generalized) (M62.81);Difficulty in walking, not elsewhere classified (R26.2)    Time: 5364-6803 PT Time Calculation (min) (ACUTE ONLY): 21 min   Charges:   PT Evaluation $PT Eval Low Complexity: 1 Low          Reuel Derby, PT, DPT  Acute Rehabilitation Services  Office: [phone]   Rudean Hitt 07/12/2022, 1:51 PM

## 2022-07-12 NOTE — Assessment & Plan Note (Signed)
-   Continue ferrous sulfate

## 2022-07-12 NOTE — Evaluation (Signed)
Occupational Therapy Evaluation Patient Details Name: Valerie [name] MRN: [id number] DOB: [birth year] Today's Date: 07/12/2022   History of Present Illness Pt is an 81 y/o female presenting with progressive SOB, chest/abdominal pain, dizziness and BLE edema. Admitted for further work up of heart failure, possible PNA. Pending palliative consult. PMH: a fib, lung CA, CHF, HLD, OSA, CAD, dementia.   Clinical Impression   PTA, pt lives with family (who can provide 24/7 assist), typically Independent with ADLs, basic IADLs (simple meal prep) and mobility without AD. Pt presents now fairly close to reported baseline. Overall, pt requires min guard for BSC transfers without AD, Independent for UB ADLs and overall min guard for LB ADLs. Pt's daughters present and engaged throughout. Educated re: DME use if unsteadiness/fatigue continues to occur with activity, initial supervision for ADLs at home, and safety with O2 line mgmt if using during the day. Anticipate no OT needs at DC but will follow acutely for energy conservation education and progression of ADLs/mobility during admission.   HR 40-50s SpO2 > 96% on 4 L O2      Recommendations for follow up therapy are one component of a multi-disciplinary discharge planning process, led by the attending physician.  Recommendations may be updated based on patient status, additional functional criteria and insurance authorization.   Follow Up Recommendations  No OT follow up    Assistance Recommended at Discharge PRN  Patient can return home with the following A little help with bathing/dressing/bathroom;Assistance with cooking/housework;Help with stairs or ramp for entrance    Functional Status Assessment  Patient has had a recent decline in their functional status and demonstrates the ability to make significant improvements in function in a reasonable and predictable amount of time.  Equipment Recommendations  None recommended by OT     Recommendations for Other Services       Precautions / Restrictions Precautions Precautions: Fall Precaution Comments: monitor O2/HR Restrictions Weight Bearing Restrictions: No      Mobility Bed Mobility Overal bed mobility: Modified Independent                  Transfers Overall transfer level: Needs assistance Equipment used: None Transfers: Sit to/from Stand, Bed to chair/wheelchair/BSC Sit to Stand: Supervision     Step pivot transfers: Min guard     General transfer comment: able to stand without assist from stretcher and BSC. min guard for safety in stepping to/from [facility] without AD though no LOB      Balance Overall balance assessment: Needs assistance Sitting-balance support: No upper extremity supported, Feet supported Sitting balance-Leahy Scale: Good     Standing balance support: No upper extremity supported, During functional activity, Single extremity supported Standing balance-Leahy Scale: Good                             ADL either performed or assessed with clinical judgement   ADL Overall ADL's : Needs assistance/impaired Eating/Feeding: Independent   Grooming: Standing;Supervision/safety   Upper Body Bathing: Independent   Lower Body Bathing: Min guard;Sit to/from stand;Sitting/lateral leans   Upper Body Dressing : Independent   Lower Body Dressing: Min guard;Sit to/from stand Lower Body Dressing Details (indicate cue type and reason): for safety with balance in standing, able to don underwear, socks and pull pants over waist Toilet Transfer: Min Patent examiner Details (indicate cue type and reason): to/from [facility] without AD Toileting- Clothing Manipulation and Hygiene: Min guard;Sitting/lateral lean;Sit to/from stand Toileting -  Clothing Manipulation Details (indicate cue type and reason): for clothing mgmt and hygiene in standing       General ADL Comments: Fairly close to baseline though  now needing 4 L O2 during the day per daughters. Discussed DME use, supervision for initial ADLs as needed to ensure safety.     Vision Baseline Vision/History: 0 No visual deficits Ability to See in Adequate Light: 0 Adequate Patient Visual Report: No change from baseline Vision Assessment?: No apparent visual deficits     Perception     Praxis      Pertinent Vitals/Pain Pain Assessment Pain Assessment: No/denies pain     Hand Dominance Right   Extremity/Trunk Assessment Upper Extremity Assessment Upper Extremity Assessment: Overall WFL for tasks assessed   Lower Extremity Assessment Lower Extremity Assessment: Defer to PT evaluation   Cervical / Trunk Assessment Cervical / Trunk Assessment: Kyphotic   Communication Communication Communication: No difficulties;Interpreter utilized;Other (comment) (Family assisting with interpreting as needed)   Cognition Arousal/Alertness: Awake/alert Behavior During Therapy: WFL for tasks assessed/performed Overall Cognitive Status: Within Functional Limits for tasks assessed                                       General Comments  Daughters present, supportive and assisting with interpreting    Exercises     Shoulder Instructions      Home Living Family/patient expects to be discharged to:: Private residence Living Arrangements: Children (2daughters, son in Sports coach and grandson) Available Help at Discharge: Family;Available 24 hours/day Type of Home: House Home Access: Stairs to enter [employer] of Steps: 2 Entrance Stairs-Rails: None Home Layout: Two level;1/2 bath on main level Alternate Level Stairs-Number of Steps: flight   Bathroom Shower/Tub: Occupational psychologist: Standard     Home Equipment: Civil engineer, contracting;Wheelchair - Publishing copy (2 wheels)   Additional Comments: Pt only comes down steps 1 time a week typically      Prior Functioning/Environment Prior Level of  Function : Needs assist             Mobility Comments: Supervision for stair navigation, able to mobilize without AD ADLs Comments: Independent with ADLs. Daughter assists with cooking, cleaning        OT Problem List: Decreased activity tolerance;Impaired balance (sitting and/or standing);Cardiopulmonary status limiting activity;Decreased knowledge of use of DME or AE      OT Treatment/Interventions: Self-care/ADL training;Therapeutic exercise;Energy conservation;DME and/or AE instruction;Therapeutic activities;Patient/family education;Balance training    OT Goals(Current goals can be found in the care plan section) Acute Rehab OT Goals Patient Stated Goal: be able to use bathroom, home soon OT Goal Formulation: With patient/family Time For Goal Achievement: 07/26/22 Potential to Achieve Goals: Good  OT Frequency: Min 2X/week    Co-evaluation PT/OT/SLP Co-Evaluation/Treatment: Yes Reason for Co-Treatment: To address functional/ADL transfers;For patient/therapist safety   OT goals addressed during session: ADL's and self-care      AM-PAC OT "6 Clicks" Daily Activity     Outcome Measure Help from another person eating meals?: None Help from another person taking care of personal grooming?: A Little Help from another person toileting, which includes using toliet, bedpan, or urinal?: A Little Help from another person bathing (including washing, rinsing, drying)?: A Little Help from another person to put on and taking off regular upper body clothing?: None Help from another person to put on and taking off regular lower body  clothing?: A Little 6 Click Score: 20   End of Session Equipment Utilized During Treatment: Oxygen Nurse Communication: Mobility status;Other (comment) (urination/BSC use)  Activity Tolerance: Patient tolerated treatment well Patient left: in bed;with call bell/phone within reach;with family/visitor present  OT Visit Diagnosis: Other abnormalities of  gait and mobility (R26.89)                Time: 3005-1102 OT Time Calculation (min): 20 min Charges:  OT General Charges $OT Visit: 1 Visit OT Evaluation $OT Eval Low Complexity: 1 Low  Malachy Chamber, OTR/L Acute Rehab Services Office: [phone]   Layla Maw 07/12/2022, 1:07 PM

## 2022-07-12 NOTE — Progress Notes (Signed)
Progress Note   Patient: Valerie [name] WHQ:[id number] DOB: 1941-09-23 DOA: 07/11/2022     1 DOS: the patient was seen and examined on 07/12/2022 at 10:03AM      Brief hospital course: Valerie [name] is an 81 y.o. F with dementia, lives at home with family, AF on Eliquis, lung CA, dCHF, chronic respiratory failure on 3L, HLD and CAD who presented with progressive SOB, chest discomfort and dizziness.  In the ER, CXR showed RUL opacity.  CTA chest showed no PE, but did show interval growth of bilateral lung masses, possible right and left heart failure.     Assessment and Plan: * Acute on chronic diastolic CHF (congestive heart failure) Acute on chronic right heart failure (HCC) Aortic regurgitation CT chest shows some bilateral ground glass opacities, consistent with edema.    Aortic insufficiency contributing. - Cotinue Furosemide 40 mg IV twice a day  - K supplement - Strict I/Os, daily weights, telemetry  - Daily monitoring renal function - Stop spironolactone - Consult Cardiology, appreciate cares    Lung cancer [facility]) The true cause of her worsening dyspnea and dizziness may just be progression of her lung cancer.  It is unclear at this time if her GGOs on CT chest are not actually lymphangitic spread of tumor.  She was clear when her progressing lung nodules were discovered 9 months ago that she would not want another round of chemo.  Now that these are larger and potentially causing dyspnea, I recommend Hospice - Consult Palliative Care   Abnormal EKG ECG shows new IVCD.  She has no active chest pain.  Family have deferred invasive work up so I think echo would be of little value.  Iron deficiency anemia - Continue ferrous sulfate  Coronary artery disease - Continue spironolactone, Eliquis, atorvastatin  Chronic respiratory failure with hypoxia (HCC) On 2L at home, supposed to use all the time, chooses not to.  Family do not check her SpO2 at home.  Unclear if she is at  her baseline O2 need or not, but here she requires 3L to maintain O2 sat 94%. - Continue supplemental O2 - Diuresis as above - If this fails, will need Hospice to follow at home  Dementia without behavioral disturbance (HCC) Mild  Hyperlipidemia - Continue atorvastatin  Permanent atrial fibrillation ([place]) Rate controlled - Continue Eliquis          Subjective: Patient still feels dizzy.  No fever, confusion, chest pain.     Physical Exam: BP (!) 115/49 (BP Location: Left Arm)   Pulse (!) 36   Temp 97.6 F (36.4 C) (Oral)   Resp 18   Ht 5\' 3"  (1.6 m)   Wt 55.2 kg   SpO2 100%   BMI 21.56 kg/m   Thin frail elderly female, lying in bed, eating crackers Slow, regular, holosystolic murmur noted, no peripheral edema Crackles at bilateral bases, respiratory effort appears normal, no wheezing Abdomen soft without tenderness palpation or guarding, no ascites or distention Attention normal, affect appropriate, oriented to self, place, city, daughter.  Moves all extremities with generalized weakness but symmetric strength, speech fluent    Data Reviewed: Discussed with palliative care Calcium 6.9, likely spurious, ionized calcium normal Hemoglobin A1c normal Hemoglobin 9.4 and stable Iron saturation 10, ferritin 28 Basic metabolic panel normal LFTs normal Troponins 20s and flat TSH normal  Family Communication: Daughter at the bedside    Disposition: Status is: Inpatient The patient was presented with dyspnea and dizziness.  Chest imaging shows  either edema or lymphangitic spread of cancer.  We will continue IV diuresis for 1 more day, if this gives him clinical improvement, we may continue her transition back to oral diuretics  Palliative care seen the patient and family would like hospice services at the time of discharge.        Author: [name], MD 07/12/2022 4:44 PM  For on call review [email or web address].

## 2022-07-12 NOTE — Hospital Course (Signed)
Valerie [name] was admitted to the hospital with the working diagnosis of decompensated heart failure and progressive lung cancer.   81 y.o. F with dementia, lives at home with family, AF on Eliquis, lung CA, dCHF, chronic respiratory failure on 3L, HLD and CAD who presented with progressive SOB, chest discomfort and dizziness. Reported 4 days of symptoms, associated with lower extremity edema.  On her initial physical examination her blood pressure was 125/60, HR 53, RR 22 and 02 saturation 98%, lungs with no rales or rhonchi, no wheezing, heart with S1 and S2 present and rhythmic, abdomen with no distention and trace lower extremity edema.   NA 140, K 4,5 Cl 97 bicarbonate 35, glucose 110 bun 21 cr 0,66 BNP 656 High sensitive troponin 29, 26, 28, 29 Wbc 4,6 hgb 9,6 plt 155  Sars covid 19 negative  Chest radiograph with bilateral interstitial focal infiltrates, more sever right upper lobe, with traction of the hemidiaphragm, left upper and left lower round interstitial infiltrates.  CT chest with no pulmonary embolism. Interval growth of multifocal bilateral lung masses and solid pulmonary nodules, compatible with progression of metastatic disease since 10/15/2020.  Bilateral ground glass opacities and interlobular septal thickening   Head CT with no acute changes.   EKG 59 bpm, right axis deviation, left bundle branch block, atrial fibrillation rhythm with no significant ST segment or T wave changes.   Patient was placed on furosemide with improvement in her symptoms.  Palliative care was consulted and patient decided to continue care under hospice. No further cancer therapy.

## 2022-07-12 NOTE — Progress Notes (Signed)
Heart Failure Navigator Progress Note  Assessed for Heart & Vascular TOC clinic readiness.  Patient does not meet criteria due to palliative care, comfort care per family. Earnestine Leys, BSN, RN Heart Failure Transport planner Only

## 2022-07-12 NOTE — ED Notes (Signed)
ED TO INPATIENT HANDOFF REPORT  ED Nurse Name and Phone #: Jaquavis Felmlee Rn [phone]  S Name/Age/Gender Valerie [name] 81 y.o. female Room/Bed: 037C/037C  Code Status   Code Status: DNR  Home/SNF/Other Home Patient oriented to: self and place Is this baseline? Yes   Triage Complete: Triage complete  Chief Complaint Right heart failure ([place]) [I50.810] Acute on chronic right heart failure [facility]) [I50.813]  Triage Note Daughter reports patient is here for SOB, CP, dizzniess and abd pain. All has been going on for 4 days.  Patient is suppose to wear 3L  at all times but patient refuses and only wears it at night. Bilateral leg swelling. Hx of lung cancer.    Allergies No Known Allergies  Level of Care/Admitting Diagnosis ED Disposition     ED Disposition  Admit   Condition  --   Comment  Hospital Area: Startup [[id number]]  Level of Care: Telemetry Medical [104]  May admit patient to [facility] or [facility] if equivalent level of care is available:: No  Covid Evaluation: Confirmed COVID Negative  Diagnosis: Acute on chronic right heart failure [facility]) [[id number]]  Admitting Physician: [name] [[id number]]  Attending Physician: [name] [[id number]]  Certification:: I certify this patient will need inpatient services for at least 2 midnights          B Medical/Surgery History Past Medical History:  Diagnosis Date   Alzheimer disease ([place])    Per new patient packet- [place]    Atrial fibrillation ([place]) 2006   Per Records received from [facility]   Dementia [facility])    Per new patient packet- [place]    Low oxygen saturation    Per new patient packet- [place]    Lung cancer ([place])    Per new patient packet- [place]    On home oxygen therapy    Per Records received from [facility]   Pneumonia    Per new patient packet- [place]    Past Surgical History:  Procedure Laterality Date   PARTIAL HYSTERECTOMY     Still with ovaries, Per new patient  packet- [facility]    RIGHT/LEFT HEART CATH AND CORONARY ANGIOGRAPHY N/A 06/21/2019   Procedure: RIGHT/LEFT HEART CATH AND CORONARY ANGIOGRAPHY;  Surgeon: [name], [name], MD;  Location: [place] [facility];  Service: Cardiovascular;  Laterality: N/A;     A IV Location/Drains/Wounds Patient Lines/Drains/Airways Status     Active Line/Drains/Airways     Name Placement date Placement time Site Days   Peripheral IV 07/11/22 20 G Anterior;Proximal;Right Forearm 07/11/22  1709  Forearm  1            Intake/Output Last 24 hours  Intake/Output Summary (Last 24 hours) at 07/12/2022 1408 Last data filed at 07/12/2022 0355 Gross per 24 hour  Intake 100 ml  Output --  Net 100 ml    Labs/Imaging Results for orders placed or performed during the hospital encounter of 07/11/22 (from the past 48 hour(s))  SARS Coronavirus 2 by RT PCR (hospital order, performed in [facility] hospital lab) *cepheid single result test* Anterior Nasal Swab     Status: None   Collection Time: 07/11/22  2:46 PM   Specimen: Anterior Nasal Swab  Result Value Ref Range   SARS Coronavirus 2 by RT PCR NEGATIVE NEGATIVE    Comment: (NOTE) SARS-CoV-2 target nucleic acids are NOT DETECTED.  The SARS-CoV-2 RNA is generally detectable in upper and lower respiratory specimens during the acute phase  of infection. The lowest concentration of SARS-CoV-2 viral copies this assay can detect is 250 copies / mL. A negative result does not preclude SARS-CoV-2 infection and should not be used as the sole basis for treatment or other patient management decisions.  A negative result may occur with improper specimen collection / handling, submission of specimen other than nasopharyngeal swab, presence of viral mutation(s) within the areas targeted by this assay, and inadequate number of viral copies (<250 copies / mL). A negative result must be combined with clinical observations, patient history, and epidemiological information.  Fact  Sheet for Patients:   https://www.patel.info/  Fact Sheet for Healthcare Providers: https://hall.com/  This test is not yet approved or  cleared by the [place] FDA and has been authorized for detection and/or diagnosis of SARS-CoV-2 by FDA under an Emergency Use Authorization (EUA).  This EUA will remain in effect (meaning this test can be used) for the duration of the COVID-19 declaration under Section 564(b)(1) of the Act, 21 U.S.C. section 360bbb-3(b)(1), unless the authorization is terminated or revoked sooner.  Performed at [facility], [place] [address] [place], [place] [address]   CBC with Differential     Status: Abnormal   Collection Time: 07/11/22  3:18 PM  Result Value Ref Range   WBC 4.6 4.0 - 10.5 K/uL   RBC 3.37 (L) 3.87 - 5.11 MIL/uL   Hemoglobin 9.6 (L) 12.0 - 15.0 g/dL   HCT 32.2 (L) 36.0 - 46.0 %   MCV 95.5 80.0 - 100.0 fL   MCH 28.5 26.0 - 34.0 pg   MCHC 29.8 (L) 30.0 - 36.0 g/dL   RDW 13.5 11.5 - 15.5 %   Platelets 155 150 - 400 K/uL   nRBC 0.0 0.0 - 0.2 %   Neutrophils Relative % 70 %   Neutro Abs 3.2 1.7 - 7.7 K/uL   Lymphocytes Relative 15 %   Lymphs Abs 0.7 0.7 - 4.0 K/uL   Monocytes Relative 13 %   Monocytes Absolute 0.6 0.1 - 1.0 K/uL   Eosinophils Relative 1 %   Eosinophils Absolute 0.1 0.0 - 0.5 K/uL   Basophils Relative 1 %   Basophils Absolute 0.0 0.0 - 0.1 K/uL   Immature Granulocytes 0 %   Abs Immature Granulocytes 0.01 0.00 - 0.07 K/uL    Comment: Performed at [facility] [address] [place], [place] [address]  Comprehensive metabolic panel     Status: Abnormal   Collection Time: 07/11/22  3:18 PM  Result Value Ref Range   Sodium 140 135 - 145 mmol/L   Potassium 4.5 3.5 - 5.1 mmol/L   Chloride 97 (L) 98 - 111 mmol/L   CO2 35 (H) 22 - 32 mmol/L   Glucose, Bld 110 (H) 70 - 99 mg/dL    Comment: Glucose reference range applies only to samples taken after fasting for at  least 8 hours.   BUN 21 8 - 23 mg/dL   Creatinine, Ser 0.66 0.44 - 1.00 mg/dL   Calcium 9.2 8.9 - 10.3 mg/dL   Total Protein 6.9 6.5 - 8.1 g/dL   Albumin 3.1 (L) 3.5 - 5.0 g/dL   AST 22 15 - 41 U/L   ALT 18 0 - 44 U/L   Alkaline Phosphatase 56 38 - 126 U/L   Total Bilirubin 0.9 0.3 - 1.2 mg/dL   GFR, Estimated >60 >60 mL/min    Comment: (NOTE) Calculated using the CKD-EPI Creatinine Equation (2021)  Anion gap 8 5 - 15    Comment: Performed at [facility] [address] [place], [place] [address]  Lipase, blood     Status: None   Collection Time: 07/11/22  3:18 PM  Result Value Ref Range   Lipase 38 11 - 51 U/L    Comment: Performed at [facility], [place] [address], [place], [place] [address]  Troponin I (High Sensitivity)     Status: Abnormal   Collection Time: 07/11/22  3:18 PM  Result Value Ref Range   Troponin I (High Sensitivity) 29 (H) <18 ng/L    Comment: (NOTE) Elevated high sensitivity troponin I (hsTnI) values and significant  changes across serial measurements may suggest ACS but many other  chronic and acute conditions are known to elevate hsTnI results.  Refer to the "Links" section for chest pain algorithms and additional  guidance. Performed at [facility], [place] [address] [place], [place] [address]   Brain natriuretic peptide     Status: Abnormal   Collection Time: 07/11/22  3:18 PM  Result Value Ref Range   B Natriuretic Peptide 656.1 (H) 0.0 - 100.0 pg/mL    Comment: Performed at [facility] [address], [place], [place] [address]  Troponin I (High Sensitivity)     Status: Abnormal   Collection Time: 07/11/22  5:09 PM  Result Value Ref Range   Troponin I (High Sensitivity) 26 (H) <18 ng/L    Comment: (NOTE) Elevated high sensitivity troponin I (hsTnI) values and significant  changes across serial measurements may suggest ACS but many other  chronic and acute conditions are known to elevate hsTnI results.  Refer to the  "Links" section for chest pain algorithms and additional  guidance. Performed at [facility], [place] [address], [place], [place] [address]   Troponin I (High Sensitivity)     Status: Abnormal   Collection Time: 07/11/22 10:50 PM  Result Value Ref Range   Troponin I (High Sensitivity) 28 (H) <18 ng/L    Comment: (NOTE) Elevated high sensitivity troponin I (hsTnI) values and significant  changes across serial measurements may suggest ACS but many other  chronic and acute conditions are known to elevate hsTnI results.  Refer to the "Links" section for chest pain algorithms and additional  guidance. Performed at [facility], [place] [address], [place], [place] [address]   CK     Status: Abnormal   Collection Time: 07/11/22 10:50 PM  Result Value Ref Range   Total CK 29 (L) 38 - 234 U/L    Comment: Performed at [facility], [place] [address], [place], [place] [address]  Magnesium     Status: None   Collection Time: 07/11/22 10:50 PM  Result Value Ref Range   Magnesium 1.7 1.7 - 2.4 mg/dL    Comment: Performed at [facility] [address], [place], [place] [address]  Phosphorus     Status: None   Collection Time: 07/11/22 10:50 PM  Result Value Ref Range   Phosphorus 3.0 2.5 - 4.6 mg/dL    Comment: Performed at [facility] [address], [place], [place] [address]  Vitamin B12     Status: Abnormal   Collection Time: 07/11/22 10:50 PM  Result Value Ref Range   Vitamin B-12 6,121 (H) 180 - 914 pg/mL    Comment: (NOTE) This assay is not validated for testing neonatal or myeloproliferative syndrome specimens  for Vitamin B12 levels. Performed at [facility], [place] [address], [place], [place] [address]   Iron and TIBC     Status: Abnormal   Collection Time: 07/11/22 10:50 PM  Result Value Ref Range   Iron 28 28 - 170 ug/dL   TIBC 279 250 - 450 ug/dL   Saturation Ratios 10 (L) 10.4 - 31.8 %   UIBC 251 ug/dL    Comment: Performed at [facility], [place] [address] [place], [place] [address]  Ferritin     Status: None   Collection Time: 07/11/22 10:50 PM  Result Value Ref Range   Ferritin 28 11 - 307 ng/mL    Comment: Performed at [facility], [place] [address], [place], [place] [address]  TSH     Status: None   Collection Time: 07/11/22 10:58 PM  Result Value Ref Range   TSH 2.728 0.350 - 4.500 uIU/mL    Comment: Performed by a 3rd Generation assay with a functional sensitivity of <=0.01 uIU/mL. Performed at [facility], [place] [address] [place], [place] [address]   Reticulocytes     Status: Abnormal   Collection Time: 07/11/22 10:58 PM  Result Value Ref Range   Retic Ct Pct 1.3 0.4 - 3.1 %   RBC. 3.47 (L) 3.87 - 5.11 MIL/uL   Retic Count, Absolute 44.1 19.0 - 186.0 K/uL   Immature Retic Fract 9.8 2.3 - 15.9 %    Comment: Performed at [facility] [address], [place], [place] [address]  Hemoglobin A1c     Status: None   Collection Time: 07/11/22 10:58 PM  Result Value Ref Range   Hgb A1c MFr Bld 5.3 4.8 - 5.6 %    Comment: (NOTE) Pre diabetes:          5.7%-6.4%  Diabetes:              >6.4%  Glycemic control for   <7.0% adults with diabetes    Mean Plasma Glucose 105.41 mg/dL    Comment: Performed at [facility] [address] [place], [place] [address]  Troponin I (High Sensitivity)     Status: Abnormal   Collection Time: 07/11/22 10:58 PM  Result Value Ref Range   Troponin I (High Sensitivity) 29 (H) <18 ng/L    Comment: (NOTE) Elevated high sensitivity troponin I (hsTnI) values and significant  changes across serial measurements may suggest ACS but many other  chronic and acute conditions are known to elevate hsTnI results.  Refer to the "Links" section for chest pain algorithms and additional  guidance. Performed at [facility], [place] [address] [place], [place] [address]   Folate     Status: None   Collection Time: 07/11/22 10:58 PM  Result Value Ref Range    Folate 15.8 >5.9 ng/mL    Comment: Performed at [facility], [place] [address], [place], [place] [address]  I-Stat venous blood gas, ED     Status: Abnormal   Collection Time: 07/11/22 11:04 PM  Result Value Ref Range   pH, Ven 7.355 7.25 - 7.43   pCO2, Ven 68.8 (H) 44 - 60 mmHg   pO2, Ven 26 (LL) 32 - 45 mmHg   Bicarbonate 38.4 (H) 20.0 - 28.0 mmol/L   TCO2 40 (H) 22 - 32 mmol/L   O2 Saturation 42 %   Acid-Base Excess 11.0 (H) 0.0 - 2.0 mmol/L   Sodium 139  135 - 145 mmol/L   Potassium 4.7 3.5 - 5.1 mmol/L   Calcium, Ion 1.21 1.15 - 1.40 mmol/L   HCT 33.0 (L) 36.0 - 46.0 %   Hemoglobin 11.2 (L) 12.0 - 15.0 g/dL   Sample type VENOUS    Comment NOTIFIED PHYSICIAN   Comprehensive metabolic panel     Status: Abnormal   Collection Time: 07/12/22  2:25 AM  Result Value Ref Range   Sodium 142 135 - 145 mmol/L   Potassium 3.6 3.5 - 5.1 mmol/L   Chloride 107 98 - 111 mmol/L   CO2 31 22 - 32 mmol/L   Glucose, Bld 93 70 - 99 mg/dL    Comment: Glucose reference range applies only to samples taken after fasting for at least 8 hours.   BUN 13 8 - 23 mg/dL   Creatinine, Ser 0.52 0.44 - 1.00 mg/dL   Calcium 6.9 (L) 8.9 - 10.3 mg/dL   Total Protein 5.2 (L) 6.5 - 8.1 g/dL   Albumin 2.3 (L) 3.5 - 5.0 g/dL   AST 16 15 - 41 U/L   ALT 14 0 - 44 U/L   Alkaline Phosphatase 39 38 - 126 U/L   Total Bilirubin 0.9 0.3 - 1.2 mg/dL   GFR, Estimated >60 >60 mL/min    Comment: (NOTE) Calculated using the CKD-EPI Creatinine Equation (2021)    Anion gap 4 (L) 5 - 15    Comment: Performed at [facility], [place] [address] [place], [place] [address]  CBC     Status: Abnormal   Collection Time: 07/12/22  2:25 AM  Result Value Ref Range   WBC 4.5 4.0 - 10.5 K/uL   RBC 3.22 (L) 3.87 - 5.11 MIL/uL   Hemoglobin 9.4 (L) 12.0 - 15.0 g/dL   HCT 30.8 (L) 36.0 - 46.0 %   MCV 95.7 80.0 - 100.0 fL   MCH 29.2 26.0 - 34.0 pg   MCHC 30.5 30.0 - 36.0 g/dL   RDW 13.4 11.5 - 15.5 %   Platelets 146 (L) 150 -  400 K/uL   nRBC 0.0 0.0 - 0.2 %    Comment: Performed at [facility], [place] [address] [place] [address], [place], [place] [address]   CT HEAD WO CONTRAST (5MM)  Result Date: 07/11/2022 CLINICAL DATA:  Vertigo, central EXAM: CT HEAD WITHOUT CONTRAST TECHNIQUE: Contiguous axial images were obtained from the base of the skull through the vertex without intravenous contrast. RADIATION DOSE REDUCTION: This exam was performed according to the departmental dose-optimization program which includes automated exposure control, adjustment of the mA and/or kV according to patient size and/or use of iterative reconstruction technique. COMPARISON:  CT head 02/18/2021 FINDINGS: Brain: No evidence of large-territorial acute infarction. No parenchymal hemorrhage. No mass lesion. No extra-axial collection. No mass effect or midline shift. No hydrocephalus. Basilar cisterns are patent. Empty sella.  Chiari 1 malformation again noted. Vascular: No hyperdense vessel. Skull: No acute fracture or focal lesion. Sinuses/Orbits: Paranasal sinuses and mastoid air cells are clear. Bilateral lens replacement. Otherwise the orbits are unremarkable. Other: None. IMPRESSION: 1. No acute intracranial abnormality. 2. Empty sella. Findings is often a normal anatomic variant but can be associated with idiopathic intracranial hypertension (pseudotumor cerebri). Electronically Signed   By: Iven Finn M.D.   On: 07/11/2022 22:53   CT Angio Chest PE W and/or Wo Contrast  Result Date: 07/11/2022 CLINICAL DATA:  Stage IV lung cancer per prior imaging studies. Dyspnea. * Tracking Code: BO * EXAM: CT  ANGIOGRAPHY CHEST WITH CONTRAST TECHNIQUE: Multidetector CT imaging of the chest was performed using the standard protocol during bolus administration of intravenous contrast. Multiplanar CT image reconstructions and MIPs were obtained to evaluate the vascular anatomy. RADIATION DOSE REDUCTION: This exam was performed according to the departmental  dose-optimization program which includes automated exposure control, adjustment of the mA and/or kV according to patient size and/or use of iterative reconstruction technique. CONTRAST:  69mL OMNIPAQUE IOHEXOL 350 MG/ML SOLN COMPARISON:  10/15/2020 chest CT angiogram. FINDINGS: Cardiovascular: The study is moderate quality for the evaluation of pulmonary embolism, with some motion degradation. There are no convincing filling defects in the central, lobar, segmental or subsegmental pulmonary artery branches to suggest acute pulmonary embolism. Atherosclerotic nonaneurysmal thoracic aorta. Dilated main pulmonary artery (4.7 cm diameter). Moderate to marked cardiomegaly. No significant pericardial fluid/thickening. Left anterior descending and left circumflex coronary atherosclerosis. Mediastinum/Nodes: No discrete thyroid nodules. Unremarkable esophagus. No axillary adenopathy. Enlarged 1.6 cm high right paraesophageal node (series 5/image 26), unchanged from 10/15/2020 CT. No newly pathologically enlarged mediastinal nodes. No discrete hilar adenopathy. Lungs/Pleura: No pneumothorax. No pleural effusion. Multifocal lung masses and solid pulmonary nodules are increased from 10/15/2020 chest CT. Representative cavitary 4.4 x 2.8 cm posterior right lower lobe lung mass (series 7/image 79), significantly increased from 2.1 x 1.3 cm. Representative 3.8 x 2.3 cm solid posterior left lower lobe lung mass (series 7/image 87), significantly increased from 2.1 x 1.7 cm. Representative 2.3 x 2.2 cm superior segment right lower lobe pulmonary nodule (series 7/image 39), increased from 0.8 x 0.6 cm. Moderate patchy ground-glass opacity and interlobular septal thickening superimposed throughout both lungs is increased from prior chest CT. Geographic consolidation in the perihilar right mid lung with associated bronchiectasis, volume loss and distortion is similar. Upper abdomen: Mild contrast reflux into the IVC. Musculoskeletal:  No aggressive appearing focal osseous lesions. Marked thoracic spondylosis. Review of the MIP images confirms the above findings. IMPRESSION: 1. No evidence of acute pulmonary embolism. 2. Interval growth of multifocal bilateral lung masses and solid pulmonary nodules, compatible with significant progression of pulmonary metastatic disease since 10/15/2020 chest CT. 3. Moderate to marked cardiomegaly. Mild contrast reflux into the IVC, suggesting right heart failure. 4. Prominently dilated main pulmonary artery, suggesting chronic pulmonary arterial hypertension. 5. Moderate patchy ground-glass opacity and interlobular septal thickening superimposed throughout both lungs, increased from prior chest CT, favor superimposed cardiogenic pulmonary edema. 6. Two-vessel coronary atherosclerosis. 7. Chronic post treatment change in the parahilar right mid lung. 8. Aortic Atherosclerosis (ICD10-I70.0). Electronically Signed   By: Ilona Sorrel M.D.   On: 07/11/2022 19:42   DG Chest 2 View  Result Date: 07/11/2022 CLINICAL DATA:  Shortness of breath. EXAM: CHEST - 2 VIEW COMPARISON:  None Available. FINDINGS: Mild cardiomegaly is noted. Right upper lobe opacity is noted concerning for pneumonia. Left lingular nodular opacity is noted which may represent pneumonia, but neoplasm cannot be excluded. Probable bibasilar scarring is noted. Probable small bilateral pleural effusions are noted. Bony thorax is unremarkable. IMPRESSION: Right upper lobe opacity is noted concerning for pneumonia. Left lingular nodular opacity is noted which may represent focal pneumonia or inflammation, but neoplasm cannot be excluded. CT scan of the chest with intravenous contrast is recommended for further evaluation. Electronically Signed   By: Marijo Conception M.D.   On: 07/11/2022 15:59    Pending Labs Unresulted Labs (From admission, onward)    None       Vitals/Pain Today's Vitals   07/12/22 1200 07/12/22 1220  07/12/22 1225 07/12/22  1228  BP: (!) 128/49 (!) 119/45    Pulse: 61 (!) 40 (!) 43 (!) 54  Resp: (!) 27 15 (!) 24 (!) 21  Temp:      TempSrc:      SpO2: 100% 100% 100% 100%  Weight:      Height:      PainSc:        Isolation Precautions Airborne and Contact precautions  Medications Medications  apixaban (ELIQUIS) tablet 5 mg (5 mg Oral Given 07/12/22 1000)  atorvastatin (LIPITOR) tablet 20 mg (20 mg Oral Given 07/12/22 1005)  spironolactone (ALDACTONE) tablet 25 mg (25 mg Oral Given 07/12/22 0959)  acetaminophen (TYLENOL) tablet 650 mg (650 mg Oral Given 07/12/22 0217)    Or  acetaminophen (TYLENOL) suppository 650 mg ( Rectal See Alternative 07/12/22 0217)  HYDROcodone-acetaminophen (NORCO/VICODIN) 5-325 MG per tablet 1-2 tablet (has no administration in time range)  furosemide (LASIX) injection 40 mg (40 mg Intravenous Given 07/12/22 0745)  sodium chloride flush (NS) 0.9 % injection 3 mL (3 mLs Intravenous Not Given 07/12/22 1000)  sodium chloride flush (NS) 0.9 % injection 3 mL (has no administration in time range)  0.9 %  sodium chloride infusion (has no administration in time range)  docusate sodium (COLACE) capsule 100 mg (100 mg Oral Given 07/12/22 0959)  senna (SENOKOT) tablet 8.6 mg (8.6 mg Oral Patient Refused/Not Given 07/12/22 1000)  polyethylene glycol (MIRALAX / GLYCOLAX) packet 17 g (has no administration in time range)  albuterol (PROVENTIL) (2.5 MG/3ML) 0.083% nebulizer solution 2.5 mg (has no administration in time range)  ferrous sulfate tablet 325 mg (325 mg Oral Given 07/12/22 0959)  iohexol (OMNIPAQUE) 350 MG/ML injection 80 mL (80 mLs Intravenous Contrast Given 07/11/22 1922)  furosemide (LASIX) injection 40 mg (40 mg Intravenous Given 07/11/22 2251)  aspirin chewable tablet 324 mg (324 mg Oral Given 07/11/22 2249)  magnesium sulfate IVPB 1 g 100 mL (0 g Intravenous Stopped 07/12/22 0355)    Mobility walks with device Moderate fall risk   Focused Assessments Cardiac Assessment  Handoff:  Cardiac Rhythm: Atrial fibrillation Loletha Grayer) Lab Results  Component Value Date   CKTOTAL 29 (L) 07/11/2022   No results found for: "DDIMER" Does the Patient currently have chest pain? No   , Neuro Assessment Handoff:  Swallow screen pass? Yes  Cardiac Rhythm: Atrial fibrillation Loletha Grayer)       Neuro Assessment: Exceptions to WDL (dizziness) Neuro Checks:      Last Documented NIHSS Modified Score:   Has TPA been given? No If patient is a Neuro Trauma and patient is going to OR before floor call report to [place] nurse: [phone] or [phone]  , Pulmonary Assessment Handoff:  Lung sounds: Bilateral Breath Sounds: Clear O2 Device: Nasal Cannula O2 Flow Rate (L/min): 4 L/min    R Recommendations: See Admitting Provider Note  Report given to:   Additional Notes: Patient has hx of dementia so is oriented to self and place but at times is confused about what year it is. Patient is spanish speaking only but her daughter is at the bedside who has been translating for her.

## 2022-07-12 NOTE — Assessment & Plan Note (Signed)
kg concerning for acute ischemia but patient denies any chest pain troponin is stable At this point patient is comfort care only as per family and patient.  We will hold off on the further EKGs or cardiac monitoring

## 2022-07-12 NOTE — Assessment & Plan Note (Addendum)
2021 echocardiogram with preserved LV systolic function with EF 50 to 55%, moderate LVH, interventricular septum flattened in systole and diastole, LA and RA with severe dilatation, severe aortic valve regurgitation, moderate to severe aortic valve stenosis. Left to right atrial septum shunting. Preserved RV systolic function with RVSP 55,4 mmHg.   Patient was placed on furosemide with improvement in her symptoms at the time of her discharge her fluid balance is negative 1080 ml.  Blood pressure 104/47 mmHg.   Patient will continue diuresis at home with furosemide 40 mg daily. She has a poor prognosis due to severe valvular heart disease in the setting or progressive lung cancer Continue care under hospice services.  Hold on spironolactone due to diastolic hypotension.

## 2022-07-12 NOTE — Assessment & Plan Note (Signed)
ECG shows new IVCD.  She has no active chest pain.  Family have deferred invasive work up so I think echo would be of little value.

## 2022-07-12 NOTE — Assessment & Plan Note (Signed)
Diffuse progressive pulmonary nodules worrisome for progression of known lung cancer patient and family does not wish to proceed leaving need further interventions like to concentrate on comfort palliative care consult in a.m. if patient continues to have dyspnea may benefit from as needed morphine

## 2022-07-12 NOTE — Assessment & Plan Note (Signed)
Patient with no chest pain Elevated troponin not consistent with acute coronary syndrome.

## 2022-07-12 NOTE — Assessment & Plan Note (Addendum)
She was clear when her progressing lung nodules were discovered 9 months ago that she would not want another round of chemo.    Patient will continue care under hospice Continue palliative supplemental 02 per [place] [facility] on chronic hypoxemic and hypercapnic respiratory failure, combination of progressive lung cancer and acute cardiogenic pulmonary edema.

## 2022-07-12 NOTE — Assessment & Plan Note (Signed)
CT chest shows some bilateral ground glass opacities, consistent with edema.   - Cotinue Furosemide 40 mg IV twice a day  - K supplement - Strict I/Os, daily weights, telemetry  - Daily monitoring renal function

## 2022-07-12 NOTE — Progress Notes (Signed)
[facility]) Hospital Liaison Note   Received request from Transitions of Care Manager, Tomi Bamberger, for hospice services at home after discharge. Patient is currently with out outpatient palliative team.    Spoke with patient's daughter, Ewelina to initiate education related to hospice philosophy, services, and team approach to care. Serenitie verbalized understanding of information given. Per discussion, the plan is for patient to discharge home via private car once cleared to DC.    DME needs discussed. Patient has the following equipment in the home: Wheelchair and Oxygen  Patient requests the following equipment for delivery: Bedside commode  Address verified and is correct in the chart. Malloree is the family member to contact to arrange time of equipment delivery.    Please send signed and completed DNR home with patient/family. Please provide prescriptions at discharge as needed to ensure ongoing symptom management.    [facility] information and contact numbers given to family & above information shared with TOC.   Please call with any questions/concerns.    Thank you for the opportunity to participate in this patient's care.   Zigmund Gottron  [facility] Liaison  [phone]

## 2022-07-12 NOTE — Consult Note (Signed)
Cardiology Consultation   Patient ID: Valerie [name] MRN: [id number]; DOB: 1941/06/26  Admit date: 07/11/2022 Date of Consult: 07/12/2022  PCP:  [name], NP   [facility] Prov[facility] Cardiologist:  [name], MD        Patient Profile:   Valerie [name] is a 81 y.o. female with a hx of  AI, AS persistent longstanding atrial fib with slow ventricular response, CHF, lung cancer that has returned and metastasized on home 02 at 3L, OSA and dementia, pt and family did not wish to proceed with TAVR who is being seen 07/12/2022 for the evaluation of CHF at the request of Dr Loleta Books.  History of Present Illness:   Valerie [name] with above hx and on eliquis for her atrial fib presents yesterday with SOB , dizziness, chest pain and abd pain.   She was ordered for home 02 24/7 but she only uses at night. Now with bilateral leg edema, dizziness.  She has orthopnea and cannot lay flat.  Some diarrhea.  In past was to see palliative care but then she moved.  Though she was followed by video visits.  Unsure last visit to oncologist.   CTA of chest no PE,  Interval growth of multifocal bilateral lung masses and solid pulmonary nodules, compatible with significant progression of pulmonary metastatic disease since 10/15/2020 chest CT.  Moderate to marked cardiomegaly, mild contrast reflux into the IVC suggestion Rt heart failure.  Prominently dilated main pulmonary artery, suggesting chronic pulmonary arterial hypertension.  Moderate patchy ground-glass opacity and interlobular septal thickening superimposed throughout both lungs, increased from prior chest CT, favor superimposed cardiogenic pulmonary edema. Two-vessel coronary atherosclerosis.   Chronic post treatment change in the parahilar right mid lung.   2V CXR Right upper lobe opacity is noted concerning for pneumonia. Left lingular nodular opacity is noted which may represent focal pneumonia or inflammation, but neoplasm cannot be  excluded  BNP 656 Hs troponin 29,26, 28, 29 Na 140, K+ 4.5 BUN 21, Cr 0.66 albumin 3.1 CK 29  WBC 4.5, Hgb 9.4 plts 146    EKG:  The EKG was personally reviewed and demonstrates:  atrial fib with HR 58 , LVH   Telemetry:  Telemetry was personally reviewed and demonstrates:  atrial fib 50s BP 119/45 P 50s R 22   She has had lasix 40 IV twice and on every 12 hours.  No chest pain , breathing is better.    Past Medical History:  Diagnosis Date   Alzheimer disease [facility])    Per new patient packet- [place]    Atrial fibrillation ([place]) 2006   Per Records received from [facility])    Per new patient packet- [facility]    Low oxygen saturation    Per new patient packet- [place]    Lung cancer [facility])    Per new patient packet- [facility]    On home oxygen therapy    Per Records received from [facility]   Pneumonia    Per new patient packet- [place]     Past Surgical History:  Procedure Laterality Date   PARTIAL HYSTERECTOMY     Still with ovaries, Per new patient packet- [facility]    RIGHT/LEFT HEART CATH AND CORONARY ANGIOGRAPHY N/A 06/21/2019   Procedure: RIGHT/LEFT HEART CATH AND CORONARY ANGIOGRAPHY;  Surgeon: [name], [name], MD;  Location: [place] [facility];  Service: Cardiovascular;  Laterality: N/A;     Home Medications:  Prior to Admission medications  Medication Sig Start Date End Date Taking? Authorizing Provider  acetaminophen-codeine (TYLENOL #3) 300-30 MG tablet Take 1 tablet by mouth every 6 (six) hours as needed for moderate pain. 11/17/21  Yes [name], NP  Albuterol Sulfate (PROAIR RESPICLICK) 062 (90 Base) MCG/ACT AEPB Inhale 1 puff into the lungs daily as needed (For shortness of breath). 10/06/20 07/13/23 Yes [provider]  apixaban (ELIQUIS) 5 MG TABS tablet Take 1 tablet (5 mg total) by mouth 2 (two) times daily. 09/14/21  Yes Cleaver, Jossie Ng, NP  atorvastatin (LIPITOR) 20 MG tablet Take 1 tablet (20 mg total) by mouth daily. 01/19/22  Yes Deberah Pelton, NP  Cholecalciferol 50 MCG (2000 UT) TABS Take 2,000 Units by mouth daily at 8 pm.    Yes [provider]  furosemide (LASIX) 20 MG tablet Take 1 tablet (20 mg total) by mouth daily. Keep the appointment for future refills. 04/05/22  Yes Cleaver, Jossie Ng, NP  OXYGEN Inhale 3 L into the lungs at bedtime.    Yes [provider]  spironolactone (ALDACTONE) 25 MG tablet Take 1 tablet (25 mg total) by mouth daily. 05/26/22  Yes [name], MD  vitamin B-12 (CYANOCOBALAMIN) 1000 MCG tablet Take 1,000 mcg by mouth daily.   Yes [provider]  rivaroxaban (XARELTO) 20 MG TABS tablet Take 1 tablet (20 mg total) by mouth daily with supper. 06/26/20 10/19/20  [name], MD    Inpatient Medications: Scheduled Meds:  apixaban  5 mg Oral BID   atorvastatin  20 mg Oral Daily   docusate sodium  100 mg Oral BID   ferrous sulfate  325 mg Oral QODAY   furosemide  40 mg Intravenous BID   senna  1 tablet Oral BID   sodium chloride flush  3 mL Intravenous Q12H   spironolactone  25 mg Oral Daily   Continuous Infusions:  sodium chloride     PRN Meds: sodium chloride, acetaminophen **OR** acetaminophen, albuterol, HYDROcodone-acetaminophen, polyethylene glycol, sodium chloride flush  Allergies:   No Known Allergies  Social History:   Social History   Socioeconomic History   Marital status: Married    Spouse name: Not on file   Number of children: Not on file   Years of education: Not on file   Highest education level: Not on file  Occupational History   Not on file  Tobacco Use   Smoking status: Never   Smokeless tobacco: Never  Vaping Use   Vaping Use: Never used  Substance and Sexual Activity   Alcohol use: Never   Drug use: Never   Sexual activity: Not on file  Other Topics Concern   Not on file  Social History Narrative   Per [place] new patient packet abstracted 03/28/2019      Diet: Vegetarian       Caffeine: None      Married, if yes  what year: Yes, 1959      Do you live in a house, apartment, assisted living, condo, trailer, ect: House, 2 stories, 5 persons      Pets: Dog      Highest level of education: 6th grade       Current/Past profession: N/A      Exercise:No         Living Will: No   DNR: No   POA/HPOA: No      Functional Status:   Do you have difficulty bathing or dressing yourself? No   Do  you have difficulty preparing food or eating? Yes   Do you have difficulty managing your medications? Yes   Do you have difficulty managing your finances? Yes   Do you have difficulty affording your medications? Nes   Social Determinants of Health   Financial Resource Strain: Low Risk  (07/12/2021)   Overall Financial Resource Strain (CARDIA)    Difficulty of Paying Living Expenses: Not hard at all  Food Insecurity: No Food Insecurity (07/12/2021)   Hunger Vital Sign    Worried About Running Out of Food in the Last Year: Never true    Ran Out of Food in the Last Year: Never true  Transportation Needs: No Transportation Needs (07/12/2021)   PRAPARE - Hydrologist (Medical): No    Lack of Transportation (Non-Medical): No  Physical Activity: Inactive (07/12/2021)   Exercise Vital Sign    Days of Exercise per Week: 0 days    Minutes of Exercise per Session: 0 min  Stress: No Stress Concern Present (07/12/2021)   [place]    Feeling of Stress : Not at all  Social Connections: Moderately Integrated (07/12/2021)   Social Connection and Isolation Panel [NHANES]    Frequency of Communication with Friends and Family: More than three times a week    Frequency of Social Gatherings with Friends and Family: Once a week    Attends Religious Services: More than 4 times per year    Active Member of [employer] or Organizations: No    Attends Archivist Meetings: Never    Marital Status: Married  Human resources officer Violence:  Not At Risk (07/12/2021)   Humiliation, Afraid, Rape, and Kick questionnaire    Fear of Current or Ex-Partner: No    Emotionally Abused: No    Physically Abused: No    Sexually Abused: No    Family History:    Family History  Problem Relation Age of Onset   Alzheimer's disease Mother    Alzheimer's disease Father      ROS:  Please see the history of present illness.  General:no colds or fevers, no weight changes Skin:no rashes or ulcers HEENT:no blurred vision, no congestion CV:see HPI PUL:see HPI, + lung cancer GI:no diarrhea constipation or melena, no indigestion GU:no hematuria, no dysuria MS:no joint pain, no claudication Neuro:no syncope, + lightheadedness, + dementia Endo:no diabetes, no thyroid disease  All other ROS reviewed and negative.     Physical Exam/Data:   Vitals:   07/12/22 1200 07/12/22 1220 07/12/22 1225 07/12/22 1228  BP: (!) 128/49 (!) 119/45    Pulse: 61 (!) 40 (!) 43 (!) 54  Resp: (!) 27 15 (!) 24 (!) 21  Temp:      TempSrc:      SpO2: 100% 100% 100% 100%  Weight:      Height:        Intake/Output Summary (Last 24 hours) at 07/12/2022 1407 Last data filed at 07/12/2022 0355 Gross per 24 hour  Intake 100 ml  Output --  Net 100 ml      07/11/2022    3:01 PM 02/25/2022    1:33 PM 09/14/2021    1:46 PM  Last 3 Weights  Weight (lbs) 127 lb 127 lb 3.2 oz 135 lb  Weight (kg) 57.607 kg 57.698 kg 61.236 kg     Body mass index is 23.23 kg/m.  General:  Well nourished, well developed, in no acute distress HEENT: normal Neck:  no JVD Vascular: No carotid bruits; Distal pulses 2+ bilaterally Cardiac: slow HR and irregular with 3-2/4 systolic Murmur no gallup Lungs:  rales to auscultation bilaterally, about 1/3 up no wheezing, rhonchi  Abd: soft, nontender, no hepatomegaly  Ext: no edema Musculoskeletal:  No deformities, BUE and BLE strength normal and equal Skin: warm and dry  Neuro:  CNs 2-12 intact, no focal abnormalities noted Psych:   Normal affect    Relevant CV Studies:  Echocardiogram 09/11/2020 IMPRESSIONS     1. Left ventricular ejection fraction, by estimation, is 50 to 55%. The  left ventricle has low normal function. The left ventricle has no regional  wall motion abnormalities. There is moderate left ventricular hypertrophy.  Left ventricular diastolic  function could not be evaluated. There is the interventricular septum is  flattened in systole and diastole, consistent with right ventricular  pressure and volume overload.   2. Left atrial size was Massively dilated (>100 ml/m2).   3. The mitral valve is abnormal. Mild mitral valve regurgitation.   4. The aortic valve is calcified. Aortic valve regurgitation is severe.  Moderate to severe aortic valve stenosis. Aortic regurgitation PHT  measures 282 msec. Aortic valve area, by VTI measures 1.00 cm. Aortic  valve mean gradient measures 29.0 mmHg.  Aortic valve Vmax measures 3.69 m/s. DI is 0.26.   5. Aortic dilatation noted. There is mild dilatation of the ascending  aorta, measuring 39 mm.   6. Right atrial size was severely dilated.   7. Evidence of atrial level shunting detected by color flow Doppler.  There is a small patent foramen ovale with predominantly left to right  shunting across the atrial septum.   8. The inferior vena cava is dilated in size with <50% respiratory  variability, suggesting right atrial pressure of 15 mmHg.   9. Right ventricular systolic function is normal. The right ventricular  size is normal. There is moderately elevated pulmonary artery systolic  pressure. The estimated right ventricular systolic pressure is 40.1 mmHg.   CT angio abdomen and pelvis 10/15/2020   IMPRESSION: 1. Vascular findings and measurements pertinent to potential TAVR procedure, as detailed above. 2. Severe thickening calcification of the aortic valve, compatible with reported clinical history of severe aortic stenosis. 3. Large filling  defect in the tip of the left atrial appendage compatible with left atrial appendage thrombus. 4. Numerous pulmonary nodules scattered throughout the lungs bilaterally, highly concerning for metastatic disease. Any one of these nodules could represent a primary bronchogenic malignancy. Further evaluation with nonemergent PET-CT is recommended in the near future to better evaluate these findings. 5. Probable area of chronic post infectious or inflammatory scarring in the periphery of the right lung. The possibility that a primary bronchogenic neoplasm exists within this area of apparent scarring is not excluded. Attention at time of forthcoming PET-CT is recommended. 6. No definite signs of metastatic disease confidently identified in the abdomen or pelvis. 7. Severe dilatation of the pulmonic trunk (4.4 cm in diameter), concerning for pulmonary arterial hypertension. 8. Aortic atherosclerosis, in addition to left main and 2 vessel coronary artery disease. There is also ectasia of the ascending thoracic aorta (4.0 cm in diameter). Recommend annual imaging followup by CTA or MRA. This recommendation follows 2010 ACCF/AHA/AATS/ACR/ASA/SCA/SCAI/SIR/STS/SVM Guidelines for the Diagnosis and Management of Patients with Thoracic Aortic Disease. Circulation. 2010; 121: U272-Z366. Aortic aneurysm NOS (ICD10-I71.9). 9. Additional incidental findings, as above. 10. These results will be called to the ordering clinician or representative by the Radiologist Assistant,  and communication documented in the PACS or [employer].     Electronically Signed   By: Vinnie Langton M.D.   On: 10/19/2020 10:26   CT angio chest aorta 10/15/2020   IMPRESSION: 1. Vascular findings and measurements pertinent to potential TAVR procedure, as detailed above. 2. Severe thickening calcification of the aortic valve, compatible with reported clinical history of severe aortic stenosis. 3. Large filling  defect in the tip of the left atrial appendage compatible with left atrial appendage thrombus. 4. Numerous pulmonary nodules scattered throughout the lungs bilaterally, highly concerning for metastatic disease. Any one of these nodules could represent a primary bronchogenic malignancy. Further evaluation with nonemergent PET-CT is recommended in the near future to better evaluate these findings. 5. Probable area of chronic post infectious or inflammatory scarring in the periphery of the right lung. The possibility that a primary bronchogenic neoplasm exists within this area of apparent scarring is not excluded. Attention at time of forthcoming PET-CT is recommended. 6. No definite signs of metastatic disease confidently identified in the abdomen or pelvis. 7. Severe dilatation of the pulmonic trunk (4.4 cm in diameter), concerning for pulmonary arterial hypertension. 8. Aortic atherosclerosis, in addition to left main and 2 vessel coronary artery disease. There is also ectasia of the ascending thoracic aorta (4.0 cm in diameter). Recommend annual imaging followup by CTA or MRA. This recommendation follows 2010 ACCF/AHA/AATS/ACR/ASA/SCA/SCAI/SIR/STS/SVM Guidelines for the Diagnosis and Management of Patients with Thoracic Aortic Disease. Circulation. 2010; 121: D638-V564. Aortic aneurysm NOS (ICD10-I71.9). 9. Additional incidental findings, as above. 10. These results will be called to the ordering clinician or representative by the Radiologist Assistant, and communication documented in the PACS or [employer].     Electronically Signed   By: Vinnie Langton M.D.   On: 10/19/2020 10:26   Coronary CTA 10/15/2020   IMPRESSION: 1. Tricuspid aortic valve. Moderately reduced cusp separation, with best preserved motion of the left coronary cusp. Incomplete cusp coaptation in diastole. Severely thickened, Severely calcified aortic valve cusps.   2.  AV calcium score: 1419    3. Annulus Area: 541 mm2. Based on these measurements, valve suited for 26 mm Sapien 3 THV.   4.  Adequate coronary artery heights from annulus.   5. The right coronary artery was previously felt to be chronically occluded on coronary angiography, however on this exam appears patent with no obvious ostial stenosis.   6. Severe dilation of main pulmonary artery, 44 mm, consistent with known pulmonary hypertension.   7. Cannot exclude LA appendage thrombus.   8. Optimum Fluoroscopic Angle for Delivery: LAO 21, CAU 21     Electronically Signed   By: Cherlynn Kaiser   On: 10/19/2020 10:09   Cardiac catheterization 06/21/2019   The left ventricular systolic function is normal. LV end diastolic pressure is normal. The left ventricular ejection fraction is 55-65% by visual estimate. There is mild aortic valve stenosis. Hemodynamic findings consistent with mild pulmonary hypertension.   1. Normal left coronary artery- very large dominant vessel. RCA could not be visualized. 2. Normal LV function 3. Mild aortic stenosis. Mean gradient 17 mm Hg. Valve area 1.85 cm squared with index 1.02.  4. Moderate to severe AI 5. Mild pulmonary HTN.  6. Normal LV filling pressures but prominent V wave on wedge tracing. 7. Normal cardiac output.   Plan; per primary cardiology team.    Diagnostic Dominance: Left Intervention   Laboratory Data:  High Sensitivity Troponin:   Recent Labs  Lab 07/11/22  1518 07/11/22 1709 07/11/22 2250 07/11/22 2258  TROPONINIHS 29* 26* 28* 29*     Chemistry Recent Labs  Lab 07/11/22 1518 07/11/22 2250 07/11/22 2304 07/12/22 0225  NA 140  --  139 142  K 4.5  --  4.7 3.6  CL 97*  --   --  107  CO2 35*  --   --  31  GLUCOSE 110*  --   --  93  BUN 21  --   --  13  CREATININE 0.66  --   --  0.52  CALCIUM 9.2  --   --  6.9*  MG  --  1.7  --   --   GFRNONAA >60  --   --  >60  ANIONGAP 8  --   --  4*    Recent Labs  Lab 07/11/22 1518  07/12/22 0225  PROT 6.9 5.2*  ALBUMIN 3.1* 2.3*  AST 22 16  ALT 18 14  ALKPHOS 56 39  BILITOT 0.9 0.9   Lipids No results for input(s): "CHOL", "TRIG", "HDL", "LABVLDL", "LDLCALC", "CHOLHDL" in the last 168 hours.  Hematology Recent Labs  Lab 07/11/22 1518 07/11/22 2258 07/11/22 2304 07/12/22 0225  WBC 4.6  --   --  4.5  RBC 3.37* 3.47*  --  3.22*  HGB 9.6*  --  11.2* 9.4*  HCT 32.2*  --  33.0* 30.8*  MCV 95.5  --   --  95.7  MCH 28.5  --   --  29.2  MCHC 29.8*  --   --  30.5  RDW 13.5  --   --  13.4  PLT 155  --   --  146*   Thyroid  Recent Labs  Lab 07/11/22 2258  TSH 2.728    BNP Recent Labs  Lab 07/11/22 1518  BNP 656.1*    DDimer No results for input(s): "DDIMER" in the last 168 hours.   Radiology/Studies:  CT HEAD WO CONTRAST (5MM)  Result Date: 07/11/2022 CLINICAL DATA:  Vertigo, central EXAM: CT HEAD WITHOUT CONTRAST TECHNIQUE: Contiguous axial images were obtained from the base of the skull through the vertex without intravenous contrast. RADIATION DOSE REDUCTION: This exam was performed according to the departmental dose-optimization program which includes automated exposure control, adjustment of the mA and/or kV according to patient size and/or use of iterative reconstruction technique. COMPARISON:  CT head 02/18/2021 FINDINGS: Brain: No evidence of large-territorial acute infarction. No parenchymal hemorrhage. No mass lesion. No extra-axial collection. No mass effect or midline shift. No hydrocephalus. Basilar cisterns are patent. Empty sella.  Chiari 1 malformation again noted. Vascular: No hyperdense vessel. Skull: No acute fracture or focal lesion. Sinuses/Orbits: Paranasal sinuses and mastoid air cells are clear. Bilateral lens replacement. Otherwise the orbits are unremarkable. Other: None. IMPRESSION: 1. No acute intracranial abnormality. 2. Empty sella. Findings is often a normal anatomic variant but can be associated with idiopathic intracranial  hypertension (pseudotumor cerebri). Electronically Signed   By: Iven Finn M.D.   On: 07/11/2022 22:53   CT Angio Chest PE W and/or Wo Contrast  Result Date: 07/11/2022 CLINICAL DATA:  Stage IV lung cancer per prior imaging studies. Dyspnea. * Tracking Code: BO * EXAM: CT ANGIOGRAPHY CHEST WITH CONTRAST TECHNIQUE: Multidetector CT imaging of the chest was performed using the standard protocol during bolus administration of intravenous contrast. Multiplanar CT image reconstructions and MIPs were obtained to evaluate the vascular anatomy. RADIATION DOSE REDUCTION: This exam was performed according to the departmental dose-optimization program  which includes automated exposure control, adjustment of the mA and/or kV according to patient size and/or use of iterative reconstruction technique. CONTRAST:  36mL OMNIPAQUE IOHEXOL 350 MG/ML SOLN COMPARISON:  10/15/2020 chest CT angiogram. FINDINGS: Cardiovascular: The study is moderate quality for the evaluation of pulmonary embolism, with some motion degradation. There are no convincing filling defects in the central, lobar, segmental or subsegmental pulmonary artery branches to suggest acute pulmonary embolism. Atherosclerotic nonaneurysmal thoracic aorta. Dilated main pulmonary artery (4.7 cm diameter). Moderate to marked cardiomegaly. No significant pericardial fluid/thickening. Left anterior descending and left circumflex coronary atherosclerosis. Mediastinum/Nodes: No discrete thyroid nodules. Unremarkable esophagus. No axillary adenopathy. Enlarged 1.6 cm high right paraesophageal node (series 5/image 26), unchanged from 10/15/2020 CT. No newly pathologically enlarged mediastinal nodes. No discrete hilar adenopathy. Lungs/Pleura: No pneumothorax. No pleural effusion. Multifocal lung masses and solid pulmonary nodules are increased from 10/15/2020 chest CT. Representative cavitary 4.4 x 2.8 cm posterior right lower lobe lung mass (series 7/image 79),  significantly increased from 2.1 x 1.3 cm. Representative 3.8 x 2.3 cm solid posterior left lower lobe lung mass (series 7/image 87), significantly increased from 2.1 x 1.7 cm. Representative 2.3 x 2.2 cm superior segment right lower lobe pulmonary nodule (series 7/image 39), increased from 0.8 x 0.6 cm. Moderate patchy ground-glass opacity and interlobular septal thickening superimposed throughout both lungs is increased from prior chest CT. Geographic consolidation in the perihilar right mid lung with associated bronchiectasis, volume loss and distortion is similar. Upper abdomen: Mild contrast reflux into the IVC. Musculoskeletal: No aggressive appearing focal osseous lesions. Marked thoracic spondylosis. Review of the MIP images confirms the above findings. IMPRESSION: 1. No evidence of acute pulmonary embolism. 2. Interval growth of multifocal bilateral lung masses and solid pulmonary nodules, compatible with significant progression of pulmonary metastatic disease since 10/15/2020 chest CT. 3. Moderate to marked cardiomegaly. Mild contrast reflux into the IVC, suggesting right heart failure. 4. Prominently dilated main pulmonary artery, suggesting chronic pulmonary arterial hypertension. 5. Moderate patchy ground-glass opacity and interlobular septal thickening superimposed throughout both lungs, increased from prior chest CT, favor superimposed cardiogenic pulmonary edema. 6. Two-vessel coronary atherosclerosis. 7. Chronic post treatment change in the parahilar right mid lung. 8. Aortic Atherosclerosis (ICD10-I70.0). Electronically Signed   By: Ilona Sorrel M.D.   On: 07/11/2022 19:42   DG Chest 2 View  Result Date: 07/11/2022 CLINICAL DATA:  Shortness of breath. EXAM: CHEST - 2 VIEW COMPARISON:  None Available. FINDINGS: Mild cardiomegaly is noted. Right upper lobe opacity is noted concerning for pneumonia. Left lingular nodular opacity is noted which may represent pneumonia, but neoplasm cannot be  excluded. Probable bibasilar scarring is noted. Probable small bilateral pleural effusions are noted. Bony thorax is unremarkable. IMPRESSION: Right upper lobe opacity is noted concerning for pneumonia. Left lingular nodular opacity is noted which may represent focal pneumonia or inflammation, but neoplasm cannot be excluded. CT scan of the chest with intravenous contrast is recommended for further evaluation. Electronically Signed   By: Marijo Conception M.D.   On: 07/11/2022 15:59     Assessment and Plan:   Acute on chronic Heart failure, Rt side and AI and AS, last echo in 08/2020 with EF 50-55%, mod LVH, severe AI and mod to severe AS.  Aortic regurgitation PHT  measures 282 msec. Aortic valve area, by VTI measures 1.00 cm. Aortic valve mean gradient measures 29.0 mmHg. Aortic valve Vmax measures 3.69 m/s. DI is 0.26.  LA massively dilated, RA severely dilated. Sm PFO  There is moderately elevated pulmonary artery systolic  pressure. The estimated right ventricular systolic pressure is 99.7 mmHg.    Agree with diuresis.  With her severe AI and mod to severe AS palliative care would be most beneficial.  Consider repeat echo though family has declined.  Diuresis for comfort.  Severe AI and mod to severe AS and no longer candidate for TAVR. Normal cors in 2020 Chronic atrial fib with slow rate chronic slow rate,  on eliquis.  Possible stop eliquis.  Lung cancer with metastasis  Pt and family agree to DNR and palliative care. This seems most reasonable.      Risk Assessment/Risk Scores:        New York Heart Association (NYHA) Functional Class NYHA Class III  CHA2DS2-VASc Score = 5   This indicates a 7.2% annual risk of stroke. The patient's score is based upon: CHF History: 1 HTN History: 1 Diabetes History: 0 Stroke History: 0 Vascular Disease History: 0 Age Score: 2 Gender Score: 1         For questions or updates, please contact [place] Please consult  [email or web address] for contact info under    Signed, Cecilie Kicks, NP  07/12/2022 2:07 PM

## 2022-07-12 NOTE — TOC Progression Note (Addendum)
Transition of Care [facility]) - Progression Note    Patient Details  Name: Valerie [name] MRN: [id number] Date of Birth: 1941/05/25  Transition of [facility]) CM/SW Contact  Zenon Mayo, RN Phone Number: 07/12/2022, 4:02 PM  Clinical Narrative:    NCM spoke with daughters in the room offered choice for home hospice, they chose [facility], states she has palliative services with them  already. Has a shower chair, w/chair and a walker.  She does not want a hospital bed.  She has a sofa in the bedroom. She will be able to go home by car at dc. She will need a tank to go home with at dc. NCM made referral to [facility] with [facility].  Daughter Teghan Philbin states they will need to call her to arrange any DME at [phone].  TOC following.         Expected Discharge Plan and Services                                                 Social Determinants of Health (SDOH) Interventions    Readmission Risk Interventions     No data to display

## 2022-07-12 NOTE — Assessment & Plan Note (Signed)
On 2L at home, supposed to use all the time, chooses not to.  Family do not check her SpO2 at home.  Unclear if she is at her baseline O2 need or not, but here she requires 3L to maintain O2 sat 94%. - Continue supplemental O2 - Diuresis as above - If this fails, will need Hospice to follow at home

## 2022-07-12 NOTE — TOC Progression Note (Signed)
Transition of Care [facility]) - Progression Note    Patient Details  Name: Valerie [name] MRN: [id number] Date of Birth: 1941-09-08  Transition of Care [facility]) CM/SW Contact  Zenon Mayo, RN Phone Number: 07/12/2022, 4:15 PM  Clinical Narrative:    She is currently with [facility], now She is set up with [facility] home hospice, daughters want her to be transported by car at dc.  They will need an oxygen tank for patient to go home with, NCM informed Clarise Cruz with [facility] of this information.  They currently have home oxygen with [employer] , which is being switched out to [employer] per daughter.  TOC following.    Expected Discharge Plan: Home w Hospice Care Barriers to Discharge: Continued Medical Work up  Expected Discharge Plan and Services Expected Discharge Plan: [place]   Discharge Planning Services: CM Consult Post Acute Care Choice: Hospice Living arrangements for the past 2 months: Single Family Home                 DME Arranged:  (Hospice to arrange DME)         HH Arranged: RN [place] Agency: [facility] ([place]) Date [place]: 07/12/22 Time [place]: [phone] Representative spoke with at [place]: Clarise Cruz   Social Determinants of Health ([place]) Interventions    Readmission Risk Interventions     No data to display

## 2022-07-12 NOTE — ED Notes (Signed)
Placed pt on purewick  

## 2022-07-12 NOTE — Assessment & Plan Note (Signed)
Continue atorvastatin

## 2022-07-12 NOTE — Consult Note (Signed)
Consultation Note Date: 07/12/2022   Patient Name: Valerie [name]  DOB: [birth year]  MRN: [id number]  Age / Sex: 81 y.o., female  PCP: [name], NP Referring Physician: [name], *  Reason for Consultation: Establishing goals of care  HPI/Patient Profile: 81 y.o. female  with past medical history of of A-fib on Eliquis, lung cancer, she will be aortic regurgitation, chronic diastolic CHF, HLD, OSA, CAD, dementia admitted on 07/11/2022 with dyspnea.   Patient with acute on chronic right CHF and diffuse pulmonary nodules concerning for progression of lung cancer. PMT has been consulted to assist with goals of care conversation.  Clinical Assessment and Goals of Care:  I have reviewed medical records including EPIC notes, labs and imaging, assessed the patient and then met at the bedside with patient's daughters Elara and Davy Pique to discuss diagnosis prognosis, GOC, EOL wishes, disposition and options.  I introduced Palliative Medicine as specialized medical care for people living with serious illness. It focuses on providing relief from the symptoms and stress of a serious illness. The goal is to improve quality of life for both the patient and the family.  We discussed a brief life review of the patient and then focused on their current illness.   I attempted to elicit values and goals of care important to the patient.    Medical History Review and Understanding:  Discussed patient's several chronic comorbidities and acute CHF exacerbation, lung cancer with likely progression.  Social History: Patient lives at home with her husband, 2 daughters, and a son.  She is 6 total children - Bevelyn, Arriola, Lovette Cliche. 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   Palliative Symptoms: Dyspnea,  dizziness  Advance Directives: A detailed discussion regarding advanced directives was had.  No HCPOA on file.  Code Status: Concepts specific to code status, artifical feeding and hydration, and rehospitalization were considered and discussed.  DNR confirmed.  See MOST form on file.  Discussion: Patient's daughters are glad to have palliative care involved during this hospital admission, as they were supposed to have this set up a long time ago but had some problems due to her move from [place]. They have since been seen by [facility]. They tell me that given her acute illness, they feel it is now time to go straight to hospice and I share my support of this. They are at peace with this decision as it has been expected for many years and they have strong faith that they will see her again in the afterlife. Counseled on the option of comfort care inpatient while outpatient hospice is being arranged. They would like to continue with the current care plan including monitoring of vital signs and repeat imaging at least until tomorrow.  Regardless of what anything else shows, they do not want patient in the hospital longer than this.   The difference between aggressive medical intervention and comfort care was considered in light of the patient's goals of care. Hospice and Palliative Care services outpatient were explained and offered.   Discussed the importance of continued conversation with family and the medical providers regarding overall plan of care and treatment options, ensuring decisions are within the context of the patient's values and GOCs.   Questions and concerns were addressed.  Hard Choices booklet left for review. The family was encouraged to call with questions or concerns.  PMT will continue to  support holistically.   SUMMARY OF RECOMMENDATIONS   -DNR -Continue current care until tomorrow, family wishes to take patient home with hospice after further diuresis  -TOC consulted for  referral to hospice of choice and also notified via secure chat -Psychosocial and emotional support provided -PMT will continue to follow and support  Prognosis: < 6 months  Discharge Planning: Home with Hospice      Primary Diagnoses: Present on Admission:  Acute on chronic right heart failure (HCC)  Permanent atrial fibrillation (HCC)  Hyperlipidemia  Lung cancer ([place])  Abnormal EKG  Dementia without behavioral disturbance ([place])  Physical Exam Vitals and nursing note reviewed.  Constitutional:      General: She is not in acute distress. Cardiovascular:     Rate and Rhythm: Bradycardia present.  Pulmonary:     Effort: Pulmonary effort is normal. No respiratory distress.  Skin:    General: Skin is warm and dry.  Neurological:     Mental Status: She is alert. Mental status is at baseline.  Psychiatric:        Mood and Affect: Mood normal.    Vital Signs: BP 118/62   Pulse (!) 59   Temp 98 F (36.7 C) (Oral)   Resp (!) 23   Ht '5\' 2"'  (1.575 m)   Wt 57.6 kg   SpO2 100%   BMI 23.23 kg/m  Pain Scale: 0-10   Pain Score: 0-No pain   SpO2: SpO2: 100 % O2 Device:SpO2: 100 % O2 Flow Rate: .O2 Flow Rate (L/min): 4 L/min   Palliative Assessment/Data: 50%     MDM: High   Reesha Debes Johnnette Litter, PA-C  Palliative Medicine Team Team phone # [phone]  Thank you for allowing the Palliative Medicine Team to assist in the care of this patient. Please utilize secure chat with additional questions, if there is no response within 30 minutes please call the above phone number.  Palliative Medicine Team providers are available by phone from 7am to 7pm daily and can be reached through the team cell phone.  Should this patient require assistance outside of these hours, please call the patient's attending physician.

## 2022-07-12 NOTE — Assessment & Plan Note (Signed)
Slow rate atrial fibrillation, continue with apixaban as tolerated.

## 2022-07-12 NOTE — ED Notes (Signed)
Medication requested from pharmacy at this time

## 2022-07-13 ENCOUNTER — Other Ambulatory Visit (HOSPITAL_COMMUNITY): Payer: Self-pay

## 2022-07-13 ENCOUNTER — Inpatient Hospital Stay (HOSPITAL_COMMUNITY): Payer: Medicare HMO

## 2022-07-13 DIAGNOSIS — D509 Iron deficiency anemia, unspecified: Secondary | ICD-10-CM

## 2022-07-13 DIAGNOSIS — F0390 Unspecified dementia without behavioral disturbance: Secondary | ICD-10-CM | POA: Diagnosis not present

## 2022-07-13 DIAGNOSIS — E782 Mixed hyperlipidemia: Secondary | ICD-10-CM | POA: Diagnosis not present

## 2022-07-13 DIAGNOSIS — C3480 Malignant neoplasm of overlapping sites of unspecified bronchus and lung: Secondary | ICD-10-CM

## 2022-07-13 DIAGNOSIS — I5033 Acute on chronic diastolic (congestive) heart failure: Secondary | ICD-10-CM | POA: Diagnosis not present

## 2022-07-13 DIAGNOSIS — I4821 Permanent atrial fibrillation: Secondary | ICD-10-CM

## 2022-07-13 DIAGNOSIS — I50813 Acute on chronic right heart failure: Secondary | ICD-10-CM | POA: Diagnosis not present

## 2022-07-13 DIAGNOSIS — I351 Nonrheumatic aortic (valve) insufficiency: Secondary | ICD-10-CM | POA: Diagnosis not present

## 2022-07-13 LAB — BASIC METABOLIC PANEL
Anion gap: 9 (ref 5–15)
BUN: 16 mg/dL (ref 8–23)
CO2: 39 mmol/L — ABNORMAL HIGH (ref 22–32)
Calcium: 8.7 mg/dL — ABNORMAL LOW (ref 8.9–10.3)
Chloride: 90 mmol/L — ABNORMAL LOW (ref 98–111)
Creatinine, Ser: 0.65 mg/dL (ref 0.44–1.00)
GFR, Estimated: >60 mL/min (ref >60)
Glucose, Bld: 85 mg/dL (ref 70–99)
Potassium: 4.5 mmol/L (ref 3.5–5.1)
Sodium: 138 mmol/L (ref 135–145)

## 2022-07-13 LAB — CBC
HCT: 32.2 % — ABNORMAL LOW (ref 36.0–46.0)
Hemoglobin: 9.7 g/dL — ABNORMAL LOW (ref 12.0–15.0)
MCH: 28.4 pg (ref 26.0–34.0)
MCHC: 30.1 g/dL (ref 30.0–36.0)
MCV: 94.4 fL (ref 80.0–100.0)
Platelets: 160 10*3/uL (ref 150–400)
RBC: 3.41 MIL/uL — ABNORMAL LOW (ref 3.87–5.11)
RDW: 13.3 % (ref 11.5–15.5)
WBC: 4.9 10*3/uL (ref 4.0–10.5)
nRBC: 0 % (ref 0.0–0.2)

## 2022-07-13 MED ORDER — FERROUS SULFATE 325 (65 FE) MG PO TABS
325.0000 mg | ORAL_TABLET | ORAL | 0 refills | Status: AC
  Filled 2022-07-13: qty 15, 30d supply, fill #0

## 2022-07-13 MED ORDER — FUROSEMIDE 40 MG PO TABS
40.0000 mg | ORAL_TABLET | Freq: Every day | ORAL | 0 refills | Status: AC
  Filled 2022-07-13: qty 30, 30d supply, fill #0

## 2022-07-13 MED ORDER — FUROSEMIDE 40 MG PO TABS: 40.0000 mg | ORAL_TABLET | Freq: Every day | ORAL | Status: DC

## 2022-07-13 MED ORDER — SPIRONOLACTONE 25 MG PO TABS: 25.0000 mg | ORAL_TABLET | Freq: Every day | ORAL | Status: DC

## 2022-07-13 NOTE — Discharge Summary (Signed)
Physician Discharge Summary   Patient: Valerie [name] MRN: [id number] DOB: 07-02-1941  Admit date:     07/11/2022  Discharge date: 07/13/22  Discharge Physician: Jimmy Picket Briannah Lona   PCP: [name], NP   Recommendations at discharge:    Patient will continue diuresis with furosemide 40 mg daily Discontinue spironolactone due to diastolic hypotension. Follow up renal function and electrolytes as outpatient Continue care under hospice services Follow up with Juluis Mire NP in 7 to 10 days.  Added iron supplementation for iron deficiency   Discharge Diagnoses: Principal Problem:   Acute on chronic diastolic CHF (congestive heart failure) (HCC) Active Problems:   Lung cancer (HCC)   Coronary artery disease   Dementia without behavioral disturbance (HCC)   Hyperlipidemia   Iron deficiency anemia   Permanent atrial fibrillation (HCC)  Resolved Problems:   * No resolved hospital problems. [facility] Course: Valerie [name] was admitted to the hospital with the working diagnosis of decompensated heart failure and progressive lung cancer.   81 y.o. F with dementia, lives at home with family, AF on Eliquis, lung CA, dCHF, chronic respiratory failure on 3L, HLD and CAD who presented with progressive SOB, chest discomfort and dizziness. Reported 4 days of symptoms, associated with lower extremity edema.  On her initial physical examination her blood pressure was 125/60, HR 53, RR 22 and 02 saturation 98%, lungs with no rales or rhonchi, no wheezing, heart with S1 and S2 present and rhythmic, abdomen with no distention and trace lower extremity edema.   NA 140, K 4,5 Cl 97 bicarbonate 35, glucose 110 bun 21 cr 0,66 BNP 656 High sensitive troponin 29, 26, 28, 29 Wbc 4,6 hgb 9,6 plt 155  Sars covid 19 negative  Chest radiograph with bilateral interstitial focal infiltrates, more sever right upper lobe, with traction of the hemidiaphragm, left upper and left lower round  interstitial infiltrates.  CT chest with no pulmonary embolism. Interval growth of multifocal bilateral lung masses and solid pulmonary nodules, compatible with progression of metastatic disease since 10/15/2020.  Bilateral ground glass opacities and interlobular septal thickening   Head CT with no acute changes.   EKG 59 bpm, right axis deviation, left bundle branch block, atrial fibrillation rhythm with no significant ST segment or T wave changes.   Patient was placed on furosemide with improvement in her symptoms.  Palliative care was consulted and patient decided to continue care under hospice. No further cancer therapy.   Assessment and Plan: * Acute on chronic diastolic CHF (congestive heart failure) ([place])  2021 echocardiogram with preserved LV systolic function with EF 50 to 55%, moderate LVH, interventricular septum flattened in systole and diastole, LA and RA with severe dilatation, severe aortic valve regurgitation, moderate to severe aortic valve stenosis. Left to right atrial septum shunting. Preserved RV systolic function with RVSP 55,4 mmHg.   Patient was placed on furosemide with improvement in her symptoms at the time of her discharge her fluid balance is negative 1080 ml.  Blood pressure 104/47 mmHg.   Patient will continue diuresis at home with furosemide 40 mg daily. She has a poor prognosis due to severe valvular heart disease in the setting or progressive lung cancer Continue care under hospice services.  Hold on spironolactone due to diastolic hypotension.    Lung cancer ([place]) She was clear when her progressing lung nodules were discovered 9 months ago that she would not want another round of chemo.    Patient will continue care under hospice  Continue palliative supplemental 02 per [place] Acute on chronic hypoxemic and hypercapnic respiratory failure, combination of progressive lung cancer and acute cardiogenic pulmonary edema.     Coronary artery disease Patient  with no chest pain Elevated troponin not consistent with acute coronary syndrome.   Dementia without behavioral disturbance (HCC) Mild cognitive impairment.  Patient with no agitation   Hyperlipidemia - Continue atorvastatin  Iron deficiency anemia - Continue ferrous sulfate  Permanent atrial fibrillation (HCC) Slow rate atrial fibrillation, continue with apixaban as tolerated.          Consultants: palliative care and cardiology  Procedures performed: none   Disposition: Home Diet recommendation:  Discharge Diet Orders (From admission, onward)     Start     Ordered   07/13/22 0000  Diet - low sodium heart healthy        07/13/22 1148           Cardiac diet DISCHARGE MEDICATION: Allergies as of 07/13/2022   No Known Allergies      Medication List     STOP taking these medications    spironolactone 25 MG tablet Commonly known as: ALDACTONE       TAKE these medications    acetaminophen-codeine 300-30 MG tablet Commonly known as: TYLENOL #3 Take 1 tablet by mouth every 6 (six) hours as needed for moderate pain.   Albuterol Sulfate 108 (90 Base) MCG/ACT Aepb Commonly known as: PROAIR RESPICLICK Inhale 1 puff into the lungs daily as needed (For shortness of breath).   apixaban 5 MG Tabs tablet Commonly known as: Eliquis Take 1 tablet (5 mg total) by mouth 2 (two) times daily.   atorvastatin 20 MG tablet Commonly known as: LIPITOR Take 1 tablet (20 mg total) by mouth daily.   Cholecalciferol 50 MCG (2000 UT) Tabs Take 2,000 Units by mouth daily at 8 pm.   cyanocobalamin 1000 MCG tablet Commonly known as: VITAMIN B12 Take 1,000 mcg by mouth daily.   ferrous sulfate 325 (65 FE) MG tablet Take 1 tablet (325 mg total) by mouth every other day. Start taking on: July 14, 2022   furosemide 40 MG tablet Commonly known as: LASIX Take 1 tablet (40 mg total) by mouth daily. Start taking on: July 14, 2022 What changed:  medication  strength how much to take additional instructions   OXYGEN Inhale 3 L into the lungs at bedtime.        Follow-up Information     [facility] Follow up.   Specialty: Hospice and Palliative Medicine Why: Home Hospice Contact information: [place] [id number] [phone]               Discharge Exam: Danley Danker Weights   07/11/22 1501 07/12/22 1549 07/13/22 0428  Weight: 57.6 kg 55.2 kg 54.1 kg   BP (!) 111/52 (BP Location: Left Arm)   Pulse (!) 59   Temp 97.8 F (36.6 C) (Oral)   Resp 18   Ht 5\' 3"  (1.6 m)   Wt 54.1 kg   SpO2 94%   BMI 21.13 kg/m   Patient is feeling well no dyspnea or chest pain, no edema  Neurology awake and alert ENT with mild pallor Cardiovascular with S1 and S2 present with positive systolic murmur at the base, with no gallops Respiratory with scattered rales but no wheezing or rhonchi Abdomen with no distention  No lower extremity edema   Condition at discharge: stable  The results of significant diagnostics from this hospitalization (including  imaging, microbiology, ancillary and laboratory) are listed below for reference.   Imaging Studies: DG Chest 2 View  Result Date: 07/13/2022 CLINICAL DATA:  Pleural effusion. EXAM: CHEST - 2 VIEW COMPARISON:  Chest two views 07/11/2022, CT chest 07/11/2022 FINDINGS: Cardiac silhouette is again moderately to markedly enlarged. Mediastinal contours are again mildly widened. Note is made that the ascending aorta measured 4.0 cm on recent CT, borderline aneurysmal. This is unchanged from 10/15/2020 CT. There are dense heterogeneous opacities within the right lung apex and lateral mid height of the right lung. Moderate patchy heterogeneous airspace opacities throughout the rest of the bilateral lungs corresponding to the solid and cavitary nodules seen on CT, increased in size on 10/15/2020 and again suspicious for metastatic disease. No pleural effusion is seen. No  pneumothorax. Mild to moderate multilevel degenerative disc changes of the thoracic spine. IMPRESSION: 1. No significant change in bilateral heterogeneous airspace opacities and more dense opacification of the lateral right mid lung and right lung apex. This corresponds to the multiple bilateral nodular and cavitary metastases and ground-glass opacities compatible with pulmonary edema seen on recent CT. 2. No pleural effusion. Electronically Signed   By: Yvonne Kendall M.D.   On: 07/13/2022 08:10   CT HEAD WO CONTRAST (5MM)  Result Date: 07/11/2022 CLINICAL DATA:  Vertigo, central EXAM: CT HEAD WITHOUT CONTRAST TECHNIQUE: Contiguous axial images were obtained from the base of the skull through the vertex without intravenous contrast. RADIATION DOSE REDUCTION: This exam was performed according to the departmental dose-optimization program which includes automated exposure control, adjustment of the mA and/or kV according to patient size and/or use of iterative reconstruction technique. COMPARISON:  CT head 02/18/2021 FINDINGS: Brain: No evidence of large-territorial acute infarction. No parenchymal hemorrhage. No mass lesion. No extra-axial collection. No mass effect or midline shift. No hydrocephalus. Basilar cisterns are patent. Empty sella.  Chiari 1 malformation again noted. Vascular: No hyperdense vessel. Skull: No acute fracture or focal lesion. Sinuses/Orbits: Paranasal sinuses and mastoid air cells are clear. Bilateral lens replacement. Otherwise the orbits are unremarkable. Other: None. IMPRESSION: 1. No acute intracranial abnormality. 2. Empty sella. Findings is often a normal anatomic variant but can be associated with idiopathic intracranial hypertension (pseudotumor cerebri). Electronically Signed   By: Iven Finn M.D.   On: 07/11/2022 22:53   CT Angio Chest PE W and/or Wo Contrast  Result Date: 07/11/2022 CLINICAL DATA:  Stage IV lung cancer per prior imaging studies. Dyspnea. * Tracking  Code: BO * EXAM: CT ANGIOGRAPHY CHEST WITH CONTRAST TECHNIQUE: Multidetector CT imaging of the chest was performed using the standard protocol during bolus administration of intravenous contrast. Multiplanar CT image reconstructions and MIPs were obtained to evaluate the vascular anatomy. RADIATION DOSE REDUCTION: This exam was performed according to the departmental dose-optimization program which includes automated exposure control, adjustment of the mA and/or kV according to patient size and/or use of iterative reconstruction technique. CONTRAST:  58mL OMNIPAQUE IOHEXOL 350 MG/ML SOLN COMPARISON:  10/15/2020 chest CT angiogram. FINDINGS: Cardiovascular: The study is moderate quality for the evaluation of pulmonary embolism, with some motion degradation. There are no convincing filling defects in the central, lobar, segmental or subsegmental pulmonary artery branches to suggest acute pulmonary embolism. Atherosclerotic nonaneurysmal thoracic aorta. Dilated main pulmonary artery (4.7 cm diameter). Moderate to marked cardiomegaly. No significant pericardial fluid/thickening. Left anterior descending and left circumflex coronary atherosclerosis. Mediastinum/Nodes: No discrete thyroid nodules. Unremarkable esophagus. No axillary adenopathy. Enlarged 1.6 cm high right paraesophageal node (series 5/image 26), unchanged from  10/15/2020 CT. No newly pathologically enlarged mediastinal nodes. No discrete hilar adenopathy. Lungs/Pleura: No pneumothorax. No pleural effusion. Multifocal lung masses and solid pulmonary nodules are increased from 10/15/2020 chest CT. Representative cavitary 4.4 x 2.8 cm posterior right lower lobe lung mass (series 7/image 79), significantly increased from 2.1 x 1.3 cm. Representative 3.8 x 2.3 cm solid posterior left lower lobe lung mass (series 7/image 87), significantly increased from 2.1 x 1.7 cm. Representative 2.3 x 2.2 cm superior segment right lower lobe pulmonary nodule (series 7/image  39), increased from 0.8 x 0.6 cm. Moderate patchy ground-glass opacity and interlobular septal thickening superimposed throughout both lungs is increased from prior chest CT. Geographic consolidation in the perihilar right mid lung with associated bronchiectasis, volume loss and distortion is similar. Upper abdomen: Mild contrast reflux into the IVC. Musculoskeletal: No aggressive appearing focal osseous lesions. Marked thoracic spondylosis. Review of the MIP images confirms the above findings. IMPRESSION: 1. No evidence of acute pulmonary embolism. 2. Interval growth of multifocal bilateral lung masses and solid pulmonary nodules, compatible with significant progression of pulmonary metastatic disease since 10/15/2020 chest CT. 3. Moderate to marked cardiomegaly. Mild contrast reflux into the IVC, suggesting right heart failure. 4. Prominently dilated main pulmonary artery, suggesting chronic pulmonary arterial hypertension. 5. Moderate patchy ground-glass opacity and interlobular septal thickening superimposed throughout both lungs, increased from prior chest CT, favor superimposed cardiogenic pulmonary edema. 6. Two-vessel coronary atherosclerosis. 7. Chronic post treatment change in the parahilar right mid lung. 8. Aortic Atherosclerosis (ICD10-I70.0). Electronically Signed   By: Ilona Sorrel M.D.   On: 07/11/2022 19:42   DG Chest 2 View  Result Date: 07/11/2022 CLINICAL DATA:  Shortness of breath. EXAM: CHEST - 2 VIEW COMPARISON:  None Available. FINDINGS: Mild cardiomegaly is noted. Right upper lobe opacity is noted concerning for pneumonia. Left lingular nodular opacity is noted which may represent pneumonia, but neoplasm cannot be excluded. Probable bibasilar scarring is noted. Probable small bilateral pleural effusions are noted. Bony thorax is unremarkable. IMPRESSION: Right upper lobe opacity is noted concerning for pneumonia. Left lingular nodular opacity is noted which may represent focal pneumonia  or inflammation, but neoplasm cannot be excluded. CT scan of the chest with intravenous contrast is recommended for further evaluation. Electronically Signed   By: Marijo Conception M.D.   On: 07/11/2022 15:59    Microbiology: Results for orders placed or performed during the hospital encounter of 07/11/22  SARS Coronavirus 2 by RT PCR (hospital order, performed in [facility] hospital lab) *cepheid single result test* Anterior Nasal Swab     Status: None   Collection Time: 07/11/22  2:46 PM   Specimen: Anterior Nasal Swab  Result Value Ref Range Status   SARS Coronavirus 2 by RT PCR NEGATIVE NEGATIVE Final    Comment: (NOTE) SARS-CoV-2 target nucleic acids are NOT DETECTED.  The SARS-CoV-2 RNA is generally detectable in upper and lower respiratory specimens during the acute phase of infection. The lowest concentration of SARS-CoV-2 viral copies this assay can detect is 250 copies / mL. A negative result does not preclude SARS-CoV-2 infection and should not be used as the sole basis for treatment or other patient management decisions.  A negative result may occur with improper specimen collection / handling, submission of specimen other than nasopharyngeal swab, presence of viral mutation(s) within the areas targeted by this assay, and inadequate number of viral copies (<250 copies / mL). A negative result must be combined with clinical observations, patient history, and epidemiological information.  Fact Sheet for Patients:   https://www.patel.info/  Fact Sheet for Healthcare Providers: https://hall.com/  This test is not yet approved or  cleared by the [place] FDA and has been authorized for detection and/or diagnosis of SARS-CoV-2 by FDA under an Emergency Use Authorization (EUA).  This EUA will remain in effect (meaning this test can be used) for the duration of the COVID-19 declaration under Section 564(b)(1) of the Act, 21  U.S.C. section 360bbb-3(b)(1), unless the authorization is terminated or revoked sooner.  Performed at [facility], [place] [address], [place], [place] [address]     Labs: CBC: Recent Labs  Lab 07/11/22 1518 07/11/22 2304 07/12/22 0225 07/13/22 0507  WBC 4.6  --  4.5 4.9  NEUTROABS 3.2  --   --   --   HGB 9.6* 11.2* 9.4* 9.7*  HCT 32.2* 33.0* 30.8* 32.2*  MCV 95.5  --  95.7 94.4  PLT 155  --  146* 233   Basic Metabolic Panel: Recent Labs  Lab 07/11/22 1518 07/11/22 2250 07/11/22 2304 07/12/22 0225 07/13/22 0507  NA 140  --  139 142 138  K 4.5  --  4.7 3.6 4.5  CL 97*  --   --  107 90*  CO2 35*  --   --  31 39*  GLUCOSE 110*  --   --  93 85  BUN 21  --   --  13 16  CREATININE 0.66  --   --  0.52 0.65  CALCIUM 9.2  --   --  6.9* 8.7*  MG  --  1.7  --   --   --   PHOS  --  3.0  --   --   --    Liver Function Tests: Recent Labs  Lab 07/11/22 1518 07/12/22 0225  AST 22 16  ALT 18 14  ALKPHOS 56 39  BILITOT 0.9 0.9  PROT 6.9 5.2*  ALBUMIN 3.1* 2.3*   CBG: No results for input(s): "GLUCAP" in the last 168 hours.  Discharge time spent: greater than 30 minutes.  Signed: Tawni Millers, MD [facility] 07/13/2022

## 2022-07-13 NOTE — TOC Transition Note (Signed)
Transition of Care [facility]) - CM/SW Discharge Note   Patient Details  Name: Valerie [name] MRN: [id number] Date of Birth: [birth year]  [facility] CM/SW Contact:  Zenon Mayo, RN Phone Number: 07/13/2022, 1:02 PM   Clinical Narrative:    Patient is for dc today, [facility] brought the oxygen tank to patient room so she could dc home via car.  Daughter is in the room to transport her home.    Final next level of care: Home w Hospice Care Barriers to Discharge: Continued Medical Work up   Patient Goals and CMS Choice Patient states their goals for this hospitalization and ongoing recovery are:: return home with hospice CMS Medicare.gov Compare Post Acute Care list provided to:: Patient Represenative (must comment) Choice offered to / list presented to : Adult Children  Discharge Placement                       Discharge Plan and Services   Discharge Planning Services: CM Consult Post Acute Care Choice: Hospice          DME Arranged:  (Hospice to arrange DME)         HH Arranged: RN [place] Agency: Hospice and [place] ([place]) Date [place]: 07/12/22 Time [place]: [phone] Representative spoke with at [place]: Clarise Cruz  Social Determinants of Health ([place]) Interventions     Readmission Risk Interventions     No data to display

## 2022-07-13 NOTE — Progress Notes (Signed)
Physical Therapy Treatment Patient Details Name: Valerie [name] MRN: [id number] DOB: 1941/01/12 Today's Date: 07/13/2022   History of Present Illness Pt is an 81 y/o female presenting with progressive SOB, chest/abdominal pain, dizziness and BLE edema. Admitted for further work up of heart failure, possible PNA. Pending palliative consult. PMH: a fib, lung CA, CHF, HLD, OSA, CAD, dementia.    PT Comments    Patient is making good progress with mobility and demonstrated safe technique for bed mobility and transfers. Pt able to complete sit<>stand from EOB and toilet with supervision and extra time. Gait progressed to ~500' ambulation with no device and no LOB noted. EOS discussed safe stair negotiation with pt/family and discussed benefits of ambulation. Will continue to progress during acute stay.    Recommendations for follow up therapy are one component of a multi-disciplinary discharge planning process, led by the attending physician.  Recommendations may be updated based on patient status, additional functional criteria and insurance authorization.  PT Recommendation   Follow Up Recommendations Home health PT Filed 07/13/2022 1017  Assistance recommended at discharge Intermittent Supervision/Assistance Filed 07/13/2022 1017  Patient can return home with the following A little help with walking and/or transfers, A little help with bathing/dressing/bathroom, Assistance with cooking/housework, Help with stairs or ramp for entrance, Assist for transportation [facility] 07/13/2022 1017  Functional Status Assessment Patient has had a recent decline in their functional status and demonstrates the ability to make significant improvements in function in a reasonable and predictable amount of time. Filed 07/12/2022 1346  PT equipment None recommended by PT Filed 07/13/2022 1017    Mobility  Bed Mobility Overal bed mobility: Modified Independent             General bed mobility comments: extra time  supine<>sit    Transfers Overall transfer level: Needs assistance Equipment used: None Transfers: Sit to/from Stand Sit to Stand: Supervision           General transfer comment: supervision for rise from EOB and toilet, no cues or assist required.    Ambulation/Gait Ambulation/Gait assistance: Supervision Gait Distance (Feet): 500 Feet Assistive device: None Gait Pattern/deviations: Step-through pattern, Decreased stride length, Narrow base of support Gait velocity: decr     General Gait Details: pt with overall steady and slow/cautious gait, maintained upright posture with no LOB noted. pt required 1 seated rest break ~halfway through gait, VSS.   Stairs             Wheelchair Mobility    Modified Rankin (Stroke Patients Only)       Balance Overall balance assessment: Needs assistance Sitting-balance support: No upper extremity supported, Feet supported Sitting balance-Leahy Scale: Good     Standing balance support: No upper extremity supported, During functional activity, Single extremity supported Standing balance-Leahy Scale: Fair                              Cognition Arousal/Alertness: Awake/alert Behavior During Therapy: WFL for tasks assessed/performed Overall Cognitive Status: Within Functional Limits for tasks assessed                                          Exercises      General Comments        Pertinent Vitals/Pain Pain Assessment Pain Assessment: No/denies pain      07/13/22 1017  PT -  End of Session  Equipment Utilized During Treatment Gait belt  Activity Tolerance Patient tolerated treatment well  Patient left in bed;with call bell/phone within reach;with family/visitor present (on stretcher in ED)  Nurse Communication Mobility status   PT - Assessment/Plan  PT Visit Diagnosis Unsteadiness on feet (R26.81);Muscle weakness (generalized) (M62.81);Difficulty in walking, not elsewhere classified  (R26.2)  PT Frequency (ACUTE ONLY) Min 3X/week  Follow Up Recommendations Home health PT  Assistance recommended at discharge Intermittent Supervision/Assistance  Patient can return home with the following A little help with walking and/or transfers;A little help with bathing/dressing/bathroom;Assistance with cooking/housework;Help with stairs or ramp for entrance;Assist for transportation  PT equipment None recommended by PT  AM-PAC PT "6 Clicks" Mobility Outcome Measure (Version 2)  Help needed turning from your back to your side while in a flat bed without using bedrails? 4  Help needed moving from lying on your back to sitting on the side of a flat bed without using bedrails? 4  Help needed moving to and from a bed to a chair (including a wheelchair)? 3  Help needed standing up from a chair using your arms (e.g., wheelchair or bedside chair)? 3  Help needed to walk in hospital room? 3  Help needed climbing 3-5 steps with a railing?  3  6 Click Score 20  Consider Recommendation of Discharge To: Home with no services  Progressive Mobility  What is the highest level of mobility based on the progressive mobility assessment? Level 4 (Walks with assist in room) - Balance while marching in place and cannot step forward and back - Complete  Acute Rehab PT Goals  PT Goal Formulation With patient/family  Time For Goal Achievement 07/26/22  Potential to Achieve Goals Good  PT Time Calculation  PT Start Time (ACUTE ONLY) 1011  PT Stop Time (ACUTE ONLY) 1038  PT Time Calculation (min) (ACUTE ONLY) 27 min  PT General Charges  $$ ACUTE PT VISIT 1 Visit  PT Treatments  $Gait Training 8-22 mins  $Therapeutic Activity 8-22 mins     Verner Mould, DPT Acute Rehabilitation Services Office [phone] Pager [phone]  07/13/22 3:00 PM

## 2022-07-13 NOTE — Progress Notes (Signed)
Daily Progress Note   Patient Name: Valerie [name]       Date: 07/13/2022 DOB: 12/23/1940  Age: 81 y.o. MRN#: [id number] Attending Physician: No att. providers found Primary Care Physician: [name], NP Admit Date: 07/11/2022  Reason for Consultation/Follow-up: Establishing goals of care  Subjective: Medical records reviewed including progress notes, labs. Patient assessed at the bedside.  She reports feeling tired after difficulty sleeping last night.  Discussed with RN.  Her 2 daughters are present visiting at the bedside.  Created space and opportunity for thoughts and feelings on patient's current illness.  Provided counseling on family's concern for patient compliance with wearing her oxygen, recommending that she is able to use it as needed for comfort rather than for goal oxygen saturations.  They verbalized their understanding.  Their main concern is that she will make it a habit to avoid using it and then her quality of life will worsen from her inability to tolerate mobility.  They plan to encourage physical therapy (informal) at home and remain interested in hospice, which is all set up for an RN to admit at home tomorrow.  Discussed concerns regarding the referral process and provided emotional support.  Questions and concerns addressed. PMT will continue to support holistically.   Length of Stay: 2   Physical Exam Vitals and nursing note reviewed.  Constitutional:      General: She is not in acute distress.    Appearance: She is ill-appearing.     Interventions: Nasal cannula in place.  Pulmonary:     Effort: Pulmonary effort is normal.  Skin:    General: Skin is warm and dry.  Neurological:     Mental Status: She is alert. Mental status is at baseline.   Psychiatric:        Mood and Affect: Mood normal.            Vital Signs: BP (!) 111/52 (BP Location: Left Arm)   Pulse (!) 59   Temp 97.8 F (36.6 C) (Oral)   Resp 18   Ht 5\' 3"  (1.6 m)   Wt 54.1 kg   SpO2 94%   BMI 21.13 kg/m  SpO2: SpO2: 94 % O2 Device: O2 Device: Nasal Cannula O2 Flow Rate: O2 Flow Rate (L/min): 3 L/min      Palliative Assessment/Data:  50%    Palliative Care Assessment & Plan   Patient Profile: 81 y.o. female  with past medical history of of A-fib on Eliquis, lung cancer, she will be aortic regurgitation, chronic diastolic CHF, HLD, OSA, CAD, dementia admitted on 07/11/2022 with dyspnea.    Patient with acute on chronic right CHF and diffuse pulmonary nodules concerning for progression of lung cancer. PMT has been consulted to assist with goals of care conversation.  Assessment: Goals of care conversation Acute on chronic diastolic CHF Lung cancer A-fib Dementia  Recommendations/Plan: Continue DNR Patient remains appropriate for discharge home with hospice today Psychosocial and emotional support provided PMT is available for support as needed   Prognosis:  < 6 months  Discharge Planning: Home with Hospice  Care plan was discussed with patient, patient's 2 daughters, RN   Total time: I spent 35 minutes in the care of the patient today in the above activities and documenting the encounter.          Vernestine Brodhead Johnnette Litter, PA-C  Palliative Medicine Team Team phone # [phone]  Thank you for allowing the Palliative Medicine Team to assist in the care of this patient. Please utilize secure chat with additional questions, if there is no response within 30 minutes please call the above phone number.  Palliative Medicine Team providers are available by phone from 7am to 7pm daily and can be reached through the team cell phone.  Should this patient require assistance outside of these hours, please call the patient's attending physician.

## 2022-07-13 NOTE — Progress Notes (Addendum)
DAILY PROGRESS NOTE   Patient Name: Valerie [name] Date of Encounter: 07/13/2022 Cardiologist: [name], MD  Chief Complaint   Breathing better  Patient Profile   Valerie [name] is a 81 y.o. female with a hx of  AI, AS persistent longstanding atrial fib with slow ventricular response, CHF, lung cancer that has returned and metastasized on home 02 at 3L, OSA and dementia, pt and family did not wish to proceed with TAVR who is being seen 07/12/2022 for the evaluation of CHF at the request of Dr Loleta Books.  Subjective   Seen by palliative yesterday - hospice services are put in place.  She is net negative about 1L yesterday.   Objective   Vitals:   07/12/22 1549 07/12/22 2036 07/13/22 0030 07/13/22 0428  BP: (!) 115/49 (!) 122/47 (!) 116/42 (!) 111/52  Pulse: (!) 36 (!) 51 (!) 58 (!) 59  Resp: 18 18 18 18   Temp: 97.6 F (36.4 C) 98.3 F (36.8 C)  97.8 F (36.6 C)  TempSrc: Oral Oral  Oral  SpO2: 100% 97% 98% 94%  Weight: 55.2 kg   54.1 kg  Height: 5\' 3"  (1.6 m)       Intake/Output Summary (Last 24 hours) at 07/13/2022 0957 Last data filed at 07/13/2022 1610 Gross per 24 hour  Intake 720 ml  Output 1900 ml  Net -1180 ml   Filed Weights   07/11/22 1501 07/12/22 1549 07/13/22 0428  Weight: 57.6 kg 55.2 kg 54.1 kg    Physical Exam   General appearance: alert and no distress Lungs: diminished breath sounds bilaterally and rhonchi bilaterally Heart: irregularly irregular rhythm, S1, S2 normal, and diastolic murmur: holodiastolic 3/6, blowing at lower left sternal border Extremities: extremities normal, atraumatic, no cyanosis or edema Neurologic: Grossly normal  Inpatient Medications    Scheduled Meds:  apixaban  5 mg Oral BID   atorvastatin  20 mg Oral Daily   docusate sodium  100 mg Oral BID   ferrous sulfate  325 mg Oral QODAY   furosemide  40 mg Intravenous BID   potassium chloride  20 mEq Oral Daily   senna  1 tablet Oral BID   sodium chloride flush  3  mL Intravenous Q12H    Continuous Infusions:  sodium chloride      PRN Meds: sodium chloride, acetaminophen **OR** acetaminophen, albuterol, HYDROcodone-acetaminophen, polyethylene glycol, sodium chloride flush   Labs   Results for orders placed or performed during the hospital encounter of 07/11/22 (from the past 48 hour(s))  SARS Coronavirus 2 by RT PCR (hospital order, performed in [place] hospital lab) *cepheid single result test* Anterior Nasal Swab     Status: None   Collection Time: 07/11/22  2:46 PM   Specimen: Anterior Nasal Swab  Result Value Ref Range   SARS Coronavirus 2 by RT PCR NEGATIVE NEGATIVE    Comment: (NOTE) SARS-CoV-2 target nucleic acids are NOT DETECTED.  The SARS-CoV-2 RNA is generally detectable in upper and lower respiratory specimens during the acute phase of infection. The lowest concentration of SARS-CoV-2 viral copies this assay can detect is 250 copies / mL. A negative result does not preclude SARS-CoV-2 infection and should not be used as the sole basis for treatment or other patient management decisions.  A negative result may occur with improper specimen collection / handling, submission of specimen other than nasopharyngeal swab, presence of viral mutation(s) within the areas targeted by this assay, and inadequate number of viral copies (<250 copies /  mL). A negative result must be combined with clinical observations, patient history, and epidemiological information.  Fact Sheet for Patients:   https://www.patel.info/  Fact Sheet for Healthcare Providers: https://hall.com/  This test is not yet approved or  cleared by the [place] FDA and has been authorized for detection and/or diagnosis of SARS-CoV-2 by FDA under an Emergency Use Authorization (EUA).  This EUA will remain in effect (meaning this test can be used) for the duration of the COVID-19 declaration under Section 564(b)(1) of  the Act, 21 U.S.C. section 360bbb-3(b)(1), unless the authorization is terminated or revoked sooner.  Performed at [facility], [place] [address] [place], [place] [address]   CBC with Differential     Status: Abnormal   Collection Time: 07/11/22  3:18 PM  Result Value Ref Range   WBC 4.6 4.0 - 10.5 K/uL   RBC 3.37 (L) 3.87 - 5.11 MIL/uL   Hemoglobin 9.6 (L) 12.0 - 15.0 g/dL   HCT 32.2 (L) 36.0 - 46.0 %   MCV 95.5 80.0 - 100.0 fL   MCH 28.5 26.0 - 34.0 pg   MCHC 29.8 (L) 30.0 - 36.0 g/dL   RDW 13.5 11.5 - 15.5 %   Platelets 155 150 - 400 K/uL   nRBC 0.0 0.0 - 0.2 %   Neutrophils Relative % 70 %   Neutro Abs 3.2 1.7 - 7.7 K/uL   Lymphocytes Relative 15 %   Lymphs Abs 0.7 0.7 - 4.0 K/uL   Monocytes Relative 13 %   Monocytes Absolute 0.6 0.1 - 1.0 K/uL   Eosinophils Relative 1 %   Eosinophils Absolute 0.1 0.0 - 0.5 K/uL   Basophils Relative 1 %   Basophils Absolute 0.0 0.0 - 0.1 K/uL   Immature Granulocytes 0 %   Abs Immature Granulocytes 0.01 0.00 - 0.07 K/uL    Comment: Performed at [facility] [address] [place], [place] [address]  Comprehensive metabolic panel     Status: Abnormal   Collection Time: 07/11/22  3:18 PM  Result Value Ref Range   Sodium 140 135 - 145 mmol/L   Potassium 4.5 3.5 - 5.1 mmol/L   Chloride 97 (L) 98 - 111 mmol/L   CO2 35 (H) 22 - 32 mmol/L   Glucose, Bld 110 (H) 70 - 99 mg/dL    Comment: Glucose reference range applies only to samples taken after fasting for at least 8 hours.   BUN 21 8 - 23 mg/dL   Creatinine, Ser 0.66 0.44 - 1.00 mg/dL   Calcium 9.2 8.9 - 10.3 mg/dL   Total Protein 6.9 6.5 - 8.1 g/dL   Albumin 3.1 (L) 3.5 - 5.0 g/dL   AST 22 15 - 41 U/L   ALT 18 0 - 44 U/L   Alkaline Phosphatase 56 38 - 126 U/L   Total Bilirubin 0.9 0.3 - 1.2 mg/dL   GFR, Estimated >60 >60 mL/min    Comment: (NOTE) Calculated using the CKD-EPI Creatinine Equation (2021)    Anion gap 8 5 - 15    Comment: Performed at [facility] [address], [place], [place] [address]  Lipase, blood     Status: None   Collection Time: 07/11/22  3:18 PM  Result Value Ref Range   Lipase 38 11 - 51 U/L    Comment: Performed at [facility], [place] [address] [place], [place] [address]  Troponin I (High Sensitivity)     Status: Abnormal  Collection Time: 07/11/22  3:18 PM  Result Value Ref Range   Troponin I (High Sensitivity) 29 (H) <18 ng/L    Comment: (NOTE) Elevated high sensitivity troponin I (hsTnI) values and significant  changes across serial measurements may suggest ACS but many other  chronic and acute conditions are known to elevate hsTnI results.  Refer to the "Links" section for chest pain algorithms and additional  guidance. Performed at [facility], [place] [address], [place], [place] [address]   Brain natriuretic peptide     Status: Abnormal   Collection Time: 07/11/22  3:18 PM  Result Value Ref Range   B Natriuretic Peptide 656.1 (H) 0.0 - 100.0 pg/mL    Comment: Performed at [facility] [address], [place], [place] [address]  Troponin I (High Sensitivity)     Status: Abnormal   Collection Time: 07/11/22  5:09 PM  Result Value Ref Range   Troponin I (High Sensitivity) 26 (H) <18 ng/L    Comment: (NOTE) Elevated high sensitivity troponin I (hsTnI) values and significant  changes across serial measurements may suggest ACS but many other  chronic and acute conditions are known to elevate hsTnI results.  Refer to the "Links" section for chest pain algorithms and additional  guidance. Performed at [facility], [place] [address], [place], [place] [address]   Troponin I (High Sensitivity)     Status: Abnormal   Collection Time: 07/11/22 10:50 PM  Result Value Ref Range   Troponin I (High Sensitivity) 28 (H) <18 ng/L    Comment: (NOTE) Elevated high sensitivity troponin I (hsTnI) values and significant  changes across serial measurements may suggest ACS but many other   chronic and acute conditions are known to elevate hsTnI results.  Refer to the "Links" section for chest pain algorithms and additional  guidance. Performed at [facility], [place] [address], [place], [place] [address]   CK     Status: Abnormal   Collection Time: 07/11/22 10:50 PM  Result Value Ref Range   Total CK 29 (L) 38 - 234 U/L    Comment: Performed at [facility], [place] [address] [place], [place] [address]  Magnesium     Status: None   Collection Time: 07/11/22 10:50 PM  Result Value Ref Range   Magnesium 1.7 1.7 - 2.4 mg/dL    Comment: Performed at [facility] [address], [place], [place] [address]  Phosphorus     Status: None   Collection Time: 07/11/22 10:50 PM  Result Value Ref Range   Phosphorus 3.0 2.5 - 4.6 mg/dL    Comment: Performed at [facility] [address], [place], [place] [address]  Vitamin B12     Status: Abnormal   Collection Time: 07/11/22 10:50 PM  Result Value Ref Range   Vitamin B-12 6,121 (H) 180 - 914 pg/mL    Comment: (NOTE) This assay is not validated for testing neonatal or myeloproliferative syndrome specimens for Vitamin B12 levels. Performed at [facility], [place] [address] [place], [place] [address]   Iron and TIBC     Status: Abnormal   Collection Time: 07/11/22 10:50 PM  Result Value Ref Range   Iron 28 28 - 170 ug/dL   TIBC 279 250 - 450 ug/dL   Saturation Ratios 10 (L) 10.4 - 31.8 %   UIBC 251 ug/dL    Comment: Performed at [facility], [place]  [address] [place], [place] [address]  Ferritin     Status: None   Collection Time: 07/11/22 10:50 PM  Result Value Ref Range   Ferritin 28 11 - 307 ng/mL    Comment: Performed at [facility], [place] [address], [place], [place] [address]  TSH     Status: None   Collection Time: 07/11/22 10:58 PM  Result Value Ref Range   TSH 2.728 0.350 - 4.500 uIU/mL    Comment: Performed by a 3rd Generation assay with a functional sensitivity of <=0.01  uIU/mL. Performed at [facility], [place] [address], [place], [place] [address]   Reticulocytes     Status: Abnormal   Collection Time: 07/11/22 10:58 PM  Result Value Ref Range   Retic Ct Pct 1.3 0.4 - 3.1 %   RBC. 3.47 (L) 3.87 - 5.11 MIL/uL   Retic Count, Absolute 44.1 19.0 - 186.0 K/uL   Immature Retic Fract 9.8 2.3 - 15.9 %    Comment: Performed at [facility] [address] [place], [place] [address]  Hemoglobin A1c     Status: None   Collection Time: 07/11/22 10:58 PM  Result Value Ref Range   Hgb A1c MFr Bld 5.3 4.8 - 5.6 %    Comment: (NOTE) Pre diabetes:          5.7%-6.4%  Diabetes:              >6.4%  Glycemic control for   <7.0% adults with diabetes    Mean Plasma Glucose 105.41 mg/dL    Comment: Performed at [facility] [address], [place], [place] [address]  Troponin I (High Sensitivity)     Status: Abnormal   Collection Time: 07/11/22 10:58 PM  Result Value Ref Range   Troponin I (High Sensitivity) 29 (H) <18 ng/L    Comment: (NOTE) Elevated high sensitivity troponin I (hsTnI) values and significant  changes across serial measurements may suggest ACS but many other  chronic and acute conditions are known to elevate hsTnI results.  Refer to the "Links" section for chest pain algorithms and additional  guidance. Performed at [facility], [place] [address], [place], [place] [address]   Folate     Status: None   Collection Time: 07/11/22 10:58 PM  Result Value Ref Range   Folate 15.8 >5.9 ng/mL    Comment: Performed at [facility], [place] [address] [place],  [address]  I-Stat venous blood gas, ED     Status: Abnormal   Collection Time: 07/11/22 11:04 PM  Result Value Ref Range   pH, Ven 7.355 7.25 - 7.43   pCO2, Ven 68.8 (H) 44 - 60 mmHg   pO2, Ven 26 (LL) 32 - 45 mmHg   Bicarbonate 38.4 (H) 20.0 - 28.0 mmol/L   TCO2 40 (H) 22 - 32 mmol/L   O2 Saturation 42 %   Acid-Base Excess 11.0 (H) 0.0 - 2.0 mmol/L    Sodium 139 135 - 145 mmol/L   Potassium 4.7 3.5 - 5.1 mmol/L   Calcium, Ion 1.21 1.15 - 1.40 mmol/L   HCT 33.0 (L) 36.0 - 46.0 %   Hemoglobin 11.2 (L) 12.0 - 15.0 g/dL   Sample type VENOUS    Comment NOTIFIED PHYSICIAN   Comprehensive metabolic panel     Status: Abnormal   Collection Time: 07/12/22  2:25 AM  Result Value Ref Range   Sodium 142 135 - 145 mmol/L  Potassium 3.6 3.5 - 5.1 mmol/L   Chloride 107 98 - 111 mmol/L   CO2 31 22 - 32 mmol/L   Glucose, Bld 93 70 - 99 mg/dL    Comment: Glucose reference range applies only to samples taken after fasting for at least 8 hours.   BUN 13 8 - 23 mg/dL   Creatinine, Ser 0.52 0.44 - 1.00 mg/dL   Calcium 6.9 (L) 8.9 - 10.3 mg/dL   Total Protein 5.2 (L) 6.5 - 8.1 g/dL   Albumin 2.3 (L) 3.5 - 5.0 g/dL   AST 16 15 - 41 U/L   ALT 14 0 - 44 U/L   Alkaline Phosphatase 39 38 - 126 U/L   Total Bilirubin 0.9 0.3 - 1.2 mg/dL   GFR, Estimated >60 >60 mL/min    Comment: (NOTE) Calculated using the CKD-EPI Creatinine Equation (2021)    Anion gap 4 (L) 5 - 15    Comment: Performed at [facility], [place] [address], [place], [place] [address]  CBC     Status: Abnormal   Collection Time: 07/12/22  2:25 AM  Result Value Ref Range   WBC 4.5 4.0 - 10.5 K/uL   RBC 3.22 (L) 3.87 - 5.11 MIL/uL   Hemoglobin 9.4 (L) 12.0 - 15.0 g/dL   HCT 30.8 (L) 36.0 - 46.0 %   MCV 95.7 80.0 - 100.0 fL   MCH 29.2 26.0 - 34.0 pg   MCHC 30.5 30.0 - 36.0 g/dL   RDW 13.4 11.5 - 15.5 %   Platelets 146 (L) 150 - 400 K/uL   nRBC 0.0 0.0 - 0.2 %    Comment: Performed at [facility], [place] [address] [place], [place] [address]  Basic metabolic panel     Status: Abnormal   Collection Time: 07/13/22  5:07 AM  Result Value Ref Range   Sodium 138 135 - 145 mmol/L   Potassium 4.5 3.5 - 5.1 mmol/L   Chloride 90 (L) 98 - 111 mmol/L   CO2 39 (H) 22 - 32 mmol/L   Glucose, Bld 85 70 - 99 mg/dL    Comment: Glucose reference range applies only to samples taken after  fasting for at least 8 hours.   BUN 16 8 - 23 mg/dL   Creatinine, Ser 0.65 0.44 - 1.00 mg/dL   Calcium 8.7 (L) 8.9 - 10.3 mg/dL   GFR, Estimated >60 >60 mL/min    Comment: (NOTE) Calculated using the CKD-EPI Creatinine Equation (2021)    Anion gap 9 5 - 15    Comment: Performed at [facility] [address], [place], [place] [address]  CBC     Status: Abnormal   Collection Time: 07/13/22  5:07 AM  Result Value Ref Range   WBC 4.9 4.0 - 10.5 K/uL   RBC 3.41 (L) 3.87 - 5.11 MIL/uL   Hemoglobin 9.7 (L) 12.0 - 15.0 g/dL   HCT 32.2 (L) 36.0 - 46.0 %   MCV 94.4 80.0 - 100.0 fL   MCH 28.4 26.0 - 34.0 pg   MCHC 30.1 30.0 - 36.0 g/dL   RDW 13.3 11.5 - 15.5 %   Platelets 160 150 - 400 K/uL   nRBC 0.0 0.0 - 0.2 %    Comment: Performed at [facility], [place] [address] [place],  [address]    ECG   N/A  Telemetry   Atrial fibrillation with CVR - Personally Reviewed  Radiology    DG Chest  2 View  Result Date: 07/13/2022 CLINICAL DATA:  Pleural effusion. EXAM: CHEST - 2 VIEW COMPARISON:  Chest two views 07/11/2022, CT chest 07/11/2022 FINDINGS: Cardiac silhouette is again moderately to markedly enlarged. Mediastinal contours are again mildly widened. Note is made that the ascending aorta measured 4.0 cm on recent CT, borderline aneurysmal. This is unchanged from 10/15/2020 CT. There are dense heterogeneous opacities within the right lung apex and lateral mid height of the right lung. Moderate patchy heterogeneous airspace opacities throughout the rest of the bilateral lungs corresponding to the solid and cavitary nodules seen on CT, increased in size on 10/15/2020 and again suspicious for metastatic disease. No pleural effusion is seen. No pneumothorax. Mild to moderate multilevel degenerative disc changes of the thoracic spine. IMPRESSION: 1. No significant change in bilateral heterogeneous airspace opacities and more dense opacification of the lateral right mid lung and  right lung apex. This corresponds to the multiple bilateral nodular and cavitary metastases and ground-glass opacities compatible with pulmonary edema seen on recent CT. 2. No pleural effusion. Electronically Signed   By: Yvonne Kendall M.D.   On: 07/13/2022 08:10   CT HEAD WO CONTRAST (5MM)  Result Date: 07/11/2022 CLINICAL DATA:  Vertigo, central EXAM: CT HEAD WITHOUT CONTRAST TECHNIQUE: Contiguous axial images were obtained from the base of the skull through the vertex without intravenous contrast. RADIATION DOSE REDUCTION: This exam was performed according to the departmental dose-optimization program which includes automated exposure control, adjustment of the mA and/or kV according to patient size and/or use of iterative reconstruction technique. COMPARISON:  CT head 02/18/2021 FINDINGS: Brain: No evidence of large-territorial acute infarction. No parenchymal hemorrhage. No mass lesion. No extra-axial collection. No mass effect or midline shift. No hydrocephalus. Basilar cisterns are patent. Empty sella.  Chiari 1 malformation again noted. Vascular: No hyperdense vessel. Skull: No acute fracture or focal lesion. Sinuses/Orbits: Paranasal sinuses and mastoid air cells are clear. Bilateral lens replacement. Otherwise the orbits are unremarkable. Other: None. IMPRESSION: 1. No acute intracranial abnormality. 2. Empty sella. Findings is often a normal anatomic variant but can be associated with idiopathic intracranial hypertension (pseudotumor cerebri). Electronically Signed   By: Iven Finn M.D.   On: 07/11/2022 22:53   CT Angio Chest PE W and/or Wo Contrast  Result Date: 07/11/2022 CLINICAL DATA:  Stage IV lung cancer per prior imaging studies. Dyspnea. * Tracking Code: BO * EXAM: CT ANGIOGRAPHY CHEST WITH CONTRAST TECHNIQUE: Multidetector CT imaging of the chest was performed using the standard protocol during bolus administration of intravenous contrast. Multiplanar CT image reconstructions and  MIPs were obtained to evaluate the vascular anatomy. RADIATION DOSE REDUCTION: This exam was performed according to the departmental dose-optimization program which includes automated exposure control, adjustment of the mA and/or kV according to patient size and/or use of iterative reconstruction technique. CONTRAST:  10mL OMNIPAQUE IOHEXOL 350 MG/ML SOLN COMPARISON:  10/15/2020 chest CT angiogram. FINDINGS: Cardiovascular: The study is moderate quality for the evaluation of pulmonary embolism, with some motion degradation. There are no convincing filling defects in the central, lobar, segmental or subsegmental pulmonary artery branches to suggest acute pulmonary embolism. Atherosclerotic nonaneurysmal thoracic aorta. Dilated main pulmonary artery (4.7 cm diameter). Moderate to marked cardiomegaly. No significant pericardial fluid/thickening. Left anterior descending and left circumflex coronary atherosclerosis. Mediastinum/Nodes: No discrete thyroid nodules. Unremarkable esophagus. No axillary adenopathy. Enlarged 1.6 cm high right paraesophageal node (series 5/image 26), unchanged from 10/15/2020 CT. No newly pathologically enlarged mediastinal nodes. No discrete hilar adenopathy. Lungs/Pleura: No pneumothorax. No  pleural effusion. Multifocal lung masses and solid pulmonary nodules are increased from 10/15/2020 chest CT. Representative cavitary 4.4 x 2.8 cm posterior right lower lobe lung mass (series 7/image 79), significantly increased from 2.1 x 1.3 cm. Representative 3.8 x 2.3 cm solid posterior left lower lobe lung mass (series 7/image 87), significantly increased from 2.1 x 1.7 cm. Representative 2.3 x 2.2 cm superior segment right lower lobe pulmonary nodule (series 7/image 39), increased from 0.8 x 0.6 cm. Moderate patchy ground-glass opacity and interlobular septal thickening superimposed throughout both lungs is increased from prior chest CT. Geographic consolidation in the perihilar right mid lung with  associated bronchiectasis, volume loss and distortion is similar. Upper abdomen: Mild contrast reflux into the IVC. Musculoskeletal: No aggressive appearing focal osseous lesions. Marked thoracic spondylosis. Review of the MIP images confirms the above findings. IMPRESSION: 1. No evidence of acute pulmonary embolism. 2. Interval growth of multifocal bilateral lung masses and solid pulmonary nodules, compatible with significant progression of pulmonary metastatic disease since 10/15/2020 chest CT. 3. Moderate to marked cardiomegaly. Mild contrast reflux into the IVC, suggesting right heart failure. 4. Prominently dilated main pulmonary artery, suggesting chronic pulmonary arterial hypertension. 5. Moderate patchy ground-glass opacity and interlobular septal thickening superimposed throughout both lungs, increased from prior chest CT, favor superimposed cardiogenic pulmonary edema. 6. Two-vessel coronary atherosclerosis. 7. Chronic post treatment change in the parahilar right mid lung. 8. Aortic Atherosclerosis (ICD10-I70.0). Electronically Signed   By: Ilona Sorrel M.D.   On: 07/11/2022 19:42   DG Chest 2 View  Result Date: 07/11/2022 CLINICAL DATA:  Shortness of breath. EXAM: CHEST - 2 VIEW COMPARISON:  None Available. FINDINGS: Mild cardiomegaly is noted. Right upper lobe opacity is noted concerning for pneumonia. Left lingular nodular opacity is noted which may represent pneumonia, but neoplasm cannot be excluded. Probable bibasilar scarring is noted. Probable small bilateral pleural effusions are noted. Bony thorax is unremarkable. IMPRESSION: Right upper lobe opacity is noted concerning for pneumonia. Left lingular nodular opacity is noted which may represent focal pneumonia or inflammation, but neoplasm cannot be excluded. CT scan of the chest with intravenous contrast is recommended for further evaluation. Electronically Signed   By: Marijo Conception M.D.   On: 07/11/2022 15:59    Cardiac Studies    N/A  Assessment   Principal Problem:   Acute on chronic diastolic CHF (congestive heart failure) (HCC) Active Problems:   Permanent atrial fibrillation (HCC)   Hyperlipidemia   Acute on chronic right heart failure (HCC)   Lung cancer (HCC)   Abnormal EKG   Dementia without behavioral disturbance (HCC)   Chronic respiratory failure with hypoxia (HCC)   Coronary artery disease   Iron deficiency anemia   Aortic regurgitation   Plan   Ok to transition to lasix 40 mg daily (up from 20 mg daily). Aldactone stopped d/t diastolic hypotension. Stable to d/c home with hospice from a cardiac standpoint.  Time Spent Directly with Patient:  I have spent a total of 25 minutes with the patient reviewing hospital notes, telemetry, EKGs, labs and examining the patient as well as establishing an assessment and plan that was discussed personally with the patient.  > 50% of time was spent in direct patient care.  Length of Stay:  LOS: 2 days   Pixie Casino, MD, [facility], [place] Director of the Advanced Lipid Disorders &  Cardiovascular Risk Reduction Clinic Diplomate of the American Board of Clinical Lipidology Attending Cardiologist  Direct Dial: [phone]  Fax: [phone]  Website:  [email or web address] 07/13/2022, 9:57 AM

## 2022-07-13 NOTE — Progress Notes (Signed)
Discharge instructions reviewed with Patient and daughters no questions at this time. Patient discharged to home.

## 2022-08-09 ENCOUNTER — Encounter: Payer: Self-pay | Admitting: *Deleted

## 2022-08-09 NOTE — Progress Notes (Signed)
[facility] Quality Team Note  Name: Valerie [name] Date of Birth: 27-Sep-1941 MRN: [id number] Date: 08/09/2022  [facility] Quality Team has reviewed this patient's chart, please see recommendations below:  [facility]-MRP; Patient was recently discharged from hospital on (07/13/2022). Please schedule office visit or telephone call to complete medication reconciliation prior to MRP deadline (08/12/2022).  [facility] Quality Other; (Pt has future ov but needs it before 08/12/2022 to meet deadline to close gap.  The ov could be done in person or over the phone.  )

## 2022-08-24 DEATH — deceased

## 2022-09-07 ENCOUNTER — Ambulatory Visit: Payer: Medicare HMO | Admitting: Internal Medicine
